# Patient Record
Sex: Male | Born: 1965 | Race: Black or African American | Hispanic: No | Marital: Married | Smoking: Never smoker
Health system: Southern US, Community
[De-identification: ages and names within clinical notes are randomized; demographics above are authoritative.]

## PROBLEM LIST (undated history)

## (undated) DIAGNOSIS — G4730 Sleep apnea, unspecified: Secondary | ICD-10-CM

## (undated) DIAGNOSIS — I10 Essential (primary) hypertension: Secondary | ICD-10-CM

## (undated) DIAGNOSIS — E119 Type 2 diabetes mellitus without complications: Secondary | ICD-10-CM

## (undated) DIAGNOSIS — T8859XA Other complications of anesthesia, initial encounter: Secondary | ICD-10-CM

## (undated) DIAGNOSIS — I639 Cerebral infarction, unspecified: Secondary | ICD-10-CM

## (undated) DIAGNOSIS — R0600 Dyspnea, unspecified: Secondary | ICD-10-CM

## (undated) DIAGNOSIS — N529 Male erectile dysfunction, unspecified: Secondary | ICD-10-CM

## (undated) DIAGNOSIS — E785 Hyperlipidemia, unspecified: Secondary | ICD-10-CM

## (undated) DIAGNOSIS — K219 Gastro-esophageal reflux disease without esophagitis: Secondary | ICD-10-CM

## (undated) DIAGNOSIS — R7989 Other specified abnormal findings of blood chemistry: Secondary | ICD-10-CM

## (undated) DIAGNOSIS — T4145XA Adverse effect of unspecified anesthetic, initial encounter: Secondary | ICD-10-CM

## (undated) DIAGNOSIS — M1990 Unspecified osteoarthritis, unspecified site: Secondary | ICD-10-CM

## (undated) DIAGNOSIS — Z8719 Personal history of other diseases of the digestive system: Secondary | ICD-10-CM

## (undated) HISTORY — DX: Essential (primary) hypertension: I10

## (undated) HISTORY — DX: Male erectile dysfunction, unspecified: N52.9

## (undated) HISTORY — DX: Other specified abnormal findings of blood chemistry: R79.89

## (undated) HISTORY — PX: OTHER SURGICAL HISTORY: SHX169

## (undated) HISTORY — DX: Hyperlipidemia, unspecified: E78.5

## (undated) HISTORY — PX: BACK SURGERY: SHX140

---

## 2003-11-04 ENCOUNTER — Ambulatory Visit (HOSPITAL_COMMUNITY): Admission: RE | Admit: 2003-11-04 | Discharge: 2003-11-04 | Payer: Self-pay | Admitting: Preventative Medicine

## 2012-12-01 ENCOUNTER — Ambulatory Visit (INDEPENDENT_AMBULATORY_CARE_PROVIDER_SITE_OTHER): Payer: Managed Care, Other (non HMO) | Admitting: Family Medicine

## 2012-12-01 ENCOUNTER — Encounter: Payer: Self-pay | Admitting: Family Medicine

## 2012-12-01 ENCOUNTER — Ambulatory Visit: Payer: Self-pay | Admitting: Family Medicine

## 2012-12-01 VITALS — BP 142/90 | Wt 251.0 lb

## 2012-12-01 DIAGNOSIS — M25569 Pain in unspecified knee: Secondary | ICD-10-CM

## 2012-12-01 DIAGNOSIS — I10 Essential (primary) hypertension: Secondary | ICD-10-CM

## 2012-12-01 DIAGNOSIS — Z0000 Encounter for general adult medical examination without abnormal findings: Secondary | ICD-10-CM

## 2012-12-01 DIAGNOSIS — E291 Testicular hypofunction: Secondary | ICD-10-CM

## 2012-12-01 MED ORDER — NAPROXEN 500 MG PO TABS: 500.0000 mg | ORAL_TABLET | Freq: Two times a day (BID) | ORAL | Status: DC

## 2012-12-01 NOTE — Progress Notes (Signed)
Arterial ultrasound at [facility] Wed May 14 @12pm .

## 2012-12-01 NOTE — Progress Notes (Signed)
  Subjective:    Patient ID: Craig Robertson, male    DOB: August 09, 1965, 47 y.o.   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 He also relates at times he's tried ibuprofen helped a little bit. He's had no known injury. The pain even bothers him at night. It does not bother him in the calves. He is worried about the possibility of circulation issues so therefore he is come in today to be checked he does notHave any history of claudication. He does smoke. Past medical history benign, HTN he is due for a wellness exam family history noncontributory Review of Systems    See above Objective:   Physical Exam  Lungs are clear, heart is regular, pulse normal. Extremities no edema skin warm dry dorsal pulse weak on both sides calf nontender no crepitus noted in knees.      Assessment & Plan:  More than likely arthralgias of the knees causing pain throughout that area certainly if progressive troubles or if worse may need further testing but for now try Naprosyn 500 mg twice a day, we will do ultrasound to rule out claudication/circulation issues and also lab work ordered he is to followup for a wellness exam within the next few weeks.

## 2012-12-01 NOTE — Patient Instructions (Addendum)
Arterial Ultrasound at [facility] Wed May 14 at 11:45am.

## 2012-12-02 ENCOUNTER — Ambulatory Visit: Payer: Self-pay | Admitting: Family Medicine

## 2012-12-03 ENCOUNTER — Ambulatory Visit (HOSPITAL_COMMUNITY)
Admission: RE | Admit: 2012-12-03 | Discharge: 2012-12-03 | Disposition: A | Discharge status: Home / Self Care | Payer: Managed Care, Other (non HMO) | Source: Ambulatory Visit | Attending: Family Medicine | Admitting: Family Medicine

## 2012-12-03 DIAGNOSIS — M25569 Pain in unspecified knee: Secondary | ICD-10-CM

## 2012-12-03 DIAGNOSIS — I739 Peripheral vascular disease, unspecified: Secondary | ICD-10-CM | POA: Insufficient documentation

## 2012-12-03 DIAGNOSIS — I10 Essential (primary) hypertension: Secondary | ICD-10-CM | POA: Insufficient documentation

## 2012-12-03 DIAGNOSIS — R29898 Other symptoms and signs involving the musculoskeletal system: Secondary | ICD-10-CM | POA: Insufficient documentation

## 2012-12-03 LAB — BASIC METABOLIC PANEL
CO2: 27 mEq/L (ref 19–32)
Calcium: 9.6 mg/dL (ref 8.4–10.5)
Chloride: 104 mEq/L (ref 96–112)
Glucose, Bld: 92 mg/dL (ref 70–99)
Potassium: 4.6 mEq/L (ref 3.5–5.3)
Sodium: 139 mEq/L (ref 135–145)

## 2012-12-03 LAB — CBC WITH DIFFERENTIAL/PLATELET
Basophils Relative: 1 % (ref 0–1)
Hemoglobin: 14.4 g/dL (ref 13.0–17.0)
MCHC: 34.3 g/dL (ref 30.0–36.0)
Monocytes Relative: 8 % (ref 3–12)
Neutro Abs: 2.2 10*3/uL (ref 1.7–7.7)
Neutrophils Relative %: 47 % (ref 43–77)
Platelets: 288 10*3/uL (ref 150–400)
RBC: 4.84 MIL/uL (ref 4.22–5.81)

## 2012-12-03 LAB — LIPID PANEL
HDL: 54 mg/dL (ref >39)
LDL Cholesterol: 120 mg/dL — ABNORMAL HIGH (ref 0–99)
Total CHOL/HDL Ratio: 3.5 Ratio
VLDL: 17 mg/dL (ref 0–40)

## 2012-12-03 LAB — HEPATIC FUNCTION PANEL
AST: 25 U/L (ref 0–37)
Bilirubin, Direct: 0.1 mg/dL (ref 0.0–0.3)
Total Bilirubin: 0.7 mg/dL (ref 0.3–1.2)

## 2012-12-03 LAB — TESTOSTERONE: Testosterone: 201 ng/dL — ABNORMAL LOW (ref 300–890)

## 2012-12-03 LAB — PSA: PSA: 1.6 ng/mL (ref <=4.00)

## 2012-12-08 ENCOUNTER — Other Ambulatory Visit: Payer: Self-pay | Admitting: Family Medicine

## 2012-12-09 ENCOUNTER — Telehealth: Payer: Self-pay | Admitting: Family Medicine

## 2012-12-09 ENCOUNTER — Other Ambulatory Visit: Payer: Self-pay | Admitting: *Deleted

## 2012-12-09 MED ORDER — FLUTICASONE PROPIONATE 50 MCG/ACT NA SUSP: 2.0000 | Freq: Every day | NASAL | Status: DC

## 2012-12-09 NOTE — Telephone Encounter (Signed)
Patient needs a refill of his flonase to [facility] [address]

## 2012-12-09 NOTE — Telephone Encounter (Signed)
Notified patient the cough could be related to her blood pressure medicine. If the cough persists recommend that she come in to be seen schedule a regular visit.in some cases we have to stop lisinopril and try a different blood pressure medicine. This is too complicated of an issue to salt over the phone if the cough persists an office visit would be mandatory. As for the numbness in the hand that could be related to carpal tunnel doctor believes a regular office visit with her provider somewhere within the next couple weeks is reasonable. She is to followup sooner problems. Medication was sent to pharmacy. Patient verbalized understanding.

## 2012-12-09 NOTE — Telephone Encounter (Signed)
Please disregard note below. Entered in error in wrong patient chart.

## 2013-01-13 ENCOUNTER — Telehealth: Payer: Self-pay | Admitting: Family Medicine

## 2013-01-13 ENCOUNTER — Other Ambulatory Visit: Payer: Self-pay | Admitting: *Deleted

## 2013-01-13 NOTE — Telephone Encounter (Signed)
Ok 6 ref 

## 2013-01-13 NOTE — Telephone Encounter (Signed)
Patient needs a refill of Androgel to [facility]

## 2013-01-13 NOTE — Telephone Encounter (Signed)
Pt aware of refill of androgel to pharmacy of choice

## 2013-01-14 ENCOUNTER — Encounter: Payer: Self-pay | Admitting: *Deleted

## 2013-01-21 ENCOUNTER — Other Ambulatory Visit: Payer: Self-pay | Admitting: Family Medicine

## 2013-01-21 ENCOUNTER — Telehealth: Payer: Self-pay | Admitting: Family Medicine

## 2013-01-21 MED ORDER — VERAPAMIL HCL ER 180 MG PO CP24: 180.0000 mg | ORAL_CAPSULE | Freq: Every day | ORAL | Status: DC

## 2013-01-21 NOTE — Telephone Encounter (Signed)
Rx sent electronically to [facility]. Patient notified.

## 2013-01-21 NOTE — Telephone Encounter (Signed)
Patient needs refill of verapamil. He is completely out.  [facility]

## 2013-02-18 ENCOUNTER — Other Ambulatory Visit: Payer: Self-pay | Admitting: Family Medicine

## 2013-03-09 ENCOUNTER — Other Ambulatory Visit: Payer: Self-pay | Admitting: Family Medicine

## 2013-03-17 ENCOUNTER — Other Ambulatory Visit: Payer: Self-pay | Admitting: Family Medicine

## 2013-04-08 ENCOUNTER — Other Ambulatory Visit: Payer: Self-pay | Admitting: *Deleted

## 2013-04-08 MED ORDER — ENALAPRIL MALEATE 10 MG PO TABS: ORAL_TABLET | ORAL | Status: DC

## 2013-04-08 MED ORDER — FLUTICASONE PROPIONATE 50 MCG/ACT NA SUSP: 2.0000 | Freq: Every day | NASAL | Status: DC

## 2013-04-08 MED ORDER — NAPROXEN 500 MG PO TABS: 500.0000 mg | ORAL_TABLET | Freq: Two times a day (BID) | ORAL | Status: DC

## 2013-04-17 ENCOUNTER — Telehealth: Payer: Self-pay | Admitting: Family Medicine

## 2013-04-17 MED ORDER — VERAPAMIL HCL ER 180 MG PO CP24: 180.0000 mg | ORAL_CAPSULE | Freq: Every day | ORAL | Status: DC

## 2013-04-17 NOTE — Telephone Encounter (Signed)
Patient needs Rx for verapamil.Marland KitchenPlease call when complete.   [facility]

## 2013-04-17 NOTE — Telephone Encounter (Signed)
Med sent to pharm. Pt notified.  

## 2013-05-31 ENCOUNTER — Other Ambulatory Visit: Payer: Self-pay | Admitting: Family Medicine

## 2013-06-20 ENCOUNTER — Other Ambulatory Visit: Payer: Self-pay | Admitting: Family Medicine

## 2013-06-23 ENCOUNTER — Encounter: Payer: Self-pay | Admitting: *Deleted

## 2013-06-23 ENCOUNTER — Other Ambulatory Visit: Payer: Self-pay | Admitting: *Deleted

## 2013-06-24 ENCOUNTER — Other Ambulatory Visit: Payer: Self-pay | Admitting: *Deleted

## 2013-06-24 MED ORDER — VERAPAMIL HCL ER 180 MG PO CP24: 180.0000 mg | ORAL_CAPSULE | Freq: Every day | ORAL | Status: DC

## 2013-07-23 DIAGNOSIS — I639 Cerebral infarction, unspecified: Secondary | ICD-10-CM

## 2013-07-23 HISTORY — DX: Cerebral infarction, unspecified: I63.9

## 2013-07-23 HISTORY — PX: LUMBAR DISC SURGERY: SHX700

## 2013-08-20 ENCOUNTER — Other Ambulatory Visit: Payer: Self-pay | Admitting: *Deleted

## 2013-08-20 MED ORDER — TESTOSTERONE 12.5 MG/ACT (1%) TD GEL: TRANSDERMAL | Status: DC

## 2013-08-27 ENCOUNTER — Ambulatory Visit (INDEPENDENT_AMBULATORY_CARE_PROVIDER_SITE_OTHER): Payer: Managed Care, Other (non HMO) | Admitting: Family Medicine

## 2013-08-27 ENCOUNTER — Encounter: Payer: Self-pay | Admitting: Family Medicine

## 2013-08-27 VITALS — BP 132/94 | Temp 99.2°F | Ht 71.5 in | Wt 252.0 lb

## 2013-08-27 DIAGNOSIS — J019 Acute sinusitis, unspecified: Secondary | ICD-10-CM

## 2013-08-27 MED ORDER — LEVOFLOXACIN 500 MG PO TABS: 500.0000 mg | ORAL_TABLET | Freq: Every day | ORAL | Status: AC

## 2013-08-27 NOTE — Progress Notes (Signed)
   Subjective:    Patient ID: Craig Robertson, male    DOB: 1966-01-19, 48 y.o.   MRN: [id number]  Cough This is a new problem. The current episode started more than 1 month ago. Associated symptoms include myalgias and nasal congestion. Associated symptoms comments: Nose bleeds. Treatments tried: flonase, mucinex, tylenol, dayquil. The treatment provided mild relief.   Patient blood pressure is up today but he admits to taking over-the-counter medications that could because go up he was encouraged once he feels well get away from those eat a heart healthy diet.   Review of Systems  Respiratory: Positive for cough.   Musculoskeletal: Positive for myalgias.       Objective:   Physical Exam Eardrums normal sinus mild tenderness throat is normal neck is supple lungs are clear no crackles heart is regular patient not toxic       Assessment & Plan:  Viral syndrome with secondary sinusitis antibiotics prescribed should gradually get better  Patient was encouraged to followup very latest by spring time to be seen for his chronic health issues

## 2013-08-27 NOTE — Patient Instructions (Signed)
Hypertension Hypertension is another name for high blood pressure. High blood pressure may mean that your heart needs to work harder to pump blood. Blood pressure consists of two numbers, which includes a higher number over a lower number (example: 110/72). HOME CARE   Make lifestyle changes as told by your doctor. This may include weight loss and exercise.  Take your blood pressure medicine every day.  Limit how much salt you use.  Stop smoking if you smoke.  Do not use drugs.  Talk to your doctor if you are using decongestants or birth control pills. These medicines might make blood pressure higher.  Females should not drink more than 1 alcoholic drink per day. Males should not drink more than 2 alcoholic drinks per day.  See your doctor as told. GET HELP RIGHT AWAY IF:   You have a blood pressure reading with a top number of 180 or higher.  You get a very bad headache.  You get blurred or changing vision.  You feel confused.  You feel weak, numb, or faint.  You get chest or belly (abdominal) pain.  You throw up (vomit).  You cannot breathe very well. MAKE SURE YOU:   Understand these instructions.  Will watch your condition.  Will get help right away if you are not doing well or get worse. Document Released: 12/26/2007 Document Revised: 10/01/2011 Document Reviewed: 12/26/2007 [facility] Patient Information 2014 [place], [place].    DASH Diet The DASH diet stands for "Dietary Approaches to Stop Hypertension." It is a healthy eating plan that has been shown to reduce high blood pressure (hypertension) in as little as 14 days, while also possibly providing other significant health benefits. These other health benefits include reducing the risk of breast cancer after menopause and reducing the risk of type 2 diabetes, heart disease, colon cancer, and stroke. Health benefits also include weight loss and slowing kidney failure in patients with chronic kidney disease.    DIET GUIDELINES  Limit salt (sodium). Your diet should contain less than 1500 mg of sodium daily.  Limit refined or processed carbohydrates. Your diet should include mostly whole grains. Desserts and added sugars should be used sparingly.  Include small amounts of heart-healthy fats. These types of fats include nuts, oils, and tub margarine. Limit saturated and trans fats. These fats have been shown to be harmful in the body. CHOOSING FOODS  The following food groups are based on a 2000 calorie diet. See your Registered Dietitian for individual calorie needs. Grains and Grain Products (6 to 8 servings daily)  Eat More Often: Whole-wheat bread, brown rice, whole-grain or wheat pasta, quinoa, popcorn without added fat or salt (air popped).  Eat Less Often: White bread, white pasta, white rice, cornbread. Vegetables (4 to 5 servings daily)  Eat More Often: Fresh, frozen, and canned vegetables. Vegetables may be raw, steamed, roasted, or grilled with a minimal amount of fat.  Eat Less Often/Avoid: Creamed or fried vegetables. Vegetables in a cheese sauce. Fruit (4 to 5 servings daily)  Eat More Often: All fresh, canned (in natural juice), or frozen fruits. Dried fruits without added sugar. One hundred percent fruit juice ( cup [237 mL] daily).  Eat Less Often: Dried fruits with added sugar. Canned fruit in light or heavy syrup. [employer], Fish, and Poultry (2 servings or less daily. One serving is 3 to 4 oz [85-114 g]).  Eat More Often: Ninety percent or leaner ground beef, tenderloin, sirloin. Round cuts of beef, chicken breast, [place] breast.  All fish. Grill, bake, or broil your meat. Nothing should be fried.  Eat Less Often/Avoid: Fatty cuts of meat, [place]urkey, or chicken leg, thigh, or wing. Fried cuts of meat or fish. Dairy (2 to 3 servings)  Eat More Often: Low-fat or fat-free milk, low-fat plain or light yogurt, reduced-fat or part-skim cheese.  Eat Less Often/Avoid: Milk (whole,  2%).Whole milk yogurt. Full-fat cheeses. Nuts, Seeds, and Legumes (4 to 5 servings per week)  Eat More Often: All without added salt.  Eat Less Often/Avoid: Salted nuts and seeds, canned beans with added salt. Fats and Sweets (limited)  Eat More Often: Vegetable oils, tub margarines without trans fats, sugar-free gelatin. Mayonnaise and salad dressings.  Eat Less Often/Avoid: Coconut oils, palm oils, butter, stick margarine, cream, half and half, cookies, candy, pie. FOR MORE INFORMATION The Dash Diet Eating Plan: www.dashdiet.org Document Released: 06/28/2011 Document Revised: 10/01/2011 Document Reviewed: 06/28/2011 [facility] Patient Information 2014 [place].

## 2013-09-06 ENCOUNTER — Other Ambulatory Visit: Payer: Self-pay | Admitting: Family Medicine

## 2013-11-13 ENCOUNTER — Telehealth: Payer: Self-pay | Admitting: Family Medicine

## 2013-11-13 ENCOUNTER — Other Ambulatory Visit: Payer: Self-pay | Admitting: *Deleted

## 2013-11-13 MED ORDER — VERAPAMIL HCL ER 180 MG PO CP24: 180.0000 mg | ORAL_CAPSULE | Freq: Every day | ORAL | Status: DC

## 2013-11-13 NOTE — Telephone Encounter (Signed)
Med sent to pharm. Pt notified.  

## 2013-11-13 NOTE — Telephone Encounter (Signed)
Patient needs Rx for verapamil.   [facility]

## 2013-11-22 DIAGNOSIS — M5412 Radiculopathy, cervical region: Secondary | ICD-10-CM | POA: Insufficient documentation

## 2013-11-22 DIAGNOSIS — M5416 Radiculopathy, lumbar region: Secondary | ICD-10-CM | POA: Insufficient documentation

## 2013-11-23 DIAGNOSIS — G8191 Hemiplegia, unspecified affecting right dominant side: Secondary | ICD-10-CM | POA: Insufficient documentation

## 2013-12-08 ENCOUNTER — Ambulatory Visit (HOSPITAL_COMMUNITY)
Admission: RE | Admit: 2013-12-08 | Discharge: 2013-12-08 | Disposition: A | Discharge status: Home / Self Care | Payer: Managed Care, Other (non HMO) | Source: Ambulatory Visit | Attending: Family Medicine | Admitting: Family Medicine

## 2013-12-08 DIAGNOSIS — IMO0002 Reserved for concepts with insufficient information to code with codable children: Secondary | ICD-10-CM

## 2013-12-08 DIAGNOSIS — M2560 Stiffness of unspecified joint, not elsewhere classified: Secondary | ICD-10-CM | POA: Insufficient documentation

## 2013-12-08 DIAGNOSIS — M25569 Pain in unspecified knee: Secondary | ICD-10-CM | POA: Insufficient documentation

## 2013-12-08 DIAGNOSIS — Z5189 Encounter for other specified aftercare: Secondary | ICD-10-CM | POA: Insufficient documentation

## 2013-12-08 DIAGNOSIS — M6281 Muscle weakness (generalized): Secondary | ICD-10-CM | POA: Insufficient documentation

## 2013-12-08 DIAGNOSIS — I69993 Ataxia following unspecified cerebrovascular disease: Secondary | ICD-10-CM | POA: Insufficient documentation

## 2013-12-08 DIAGNOSIS — I10 Essential (primary) hypertension: Secondary | ICD-10-CM | POA: Insufficient documentation

## 2013-12-08 DIAGNOSIS — R52 Pain, unspecified: Secondary | ICD-10-CM | POA: Insufficient documentation

## 2013-12-08 HISTORY — DX: Reserved for concepts with insufficient information to code with codable children: IMO0002

## 2013-12-08 NOTE — Evaluation (Signed)
Physical Therapy Evaluation  Patient Details  Name: Craig Robertson MRN: [id number] Date of Birth: [birth year]  Today's Date: 12/08/2013 Time: [id number] PT Time Calculation (min): 45 min    Charges 1 Eval, [id number] therEx          Visit#: 1 of 24  Re-eval: 01/07/14 Assessment Diagnosis: Rt sided LE weakness SP CVA.  Next MD Visit: Cheryle HorsfallLukiin, June 3  Authorization: [employer]    Authorization Time Period:    Authorization Visit#: 1 of 24   Past Medical History:  Past Medical History  Diagnosis Date  . Hypertension   . ED (erectile dysfunction)   . Hyperlipidemia   . Low testosterone    Past Surgical History: No past surgical history on file.  Subjective Symptoms/Limitations Symptoms: Patient has Rt knee pain. TTP. 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  Special Tests: Functional Assessment Questionnaire (FAQ) 36/68 (53% independent, 475 impaired) Patient Stated Goals: Patient was to be able to return to fishing, motor cycling,  Pain Assessment Currently in Pain?: Yes Pain Score: 6  Pain Location: Knee Pain Orientation: Right Pain Onset: 1 to 4 weeks ago Pain Frequency: Constant Pain Relieving Factors: Ice, pain medication only dulls it Effect of Pain on Daily Activities: bendig the knee, kneeling.   Precautions/Restrictions  Precautions Precautions: None Restrictions Weight Bearing Restrictions: No  Balance Screening Balance Screen Has the patient fallen in the past 6 months: No  Prior Functioning  Home Living Family/patient expects to be discharged to:: Private residence Living Arrangements: Spouse/significant other;Children Available Help at Discharge: Family (wife, 46 yo son) Prior Function Level of Independence: Independent with basic ADLs;Independent with homemaking with ambulation Driving: Yes (no currently) Vocation: Full time  employment Vocation Requirements: Programme researcher, broadcasting/film/videomachanic for [employer] (Pt wants to return to work) Leisure: Hobbies-yes (Comment) Comments: Fishing, riding motorcycles  Cognition/Observation Cognition Overall Cognitive Status: Within Functional Limits for tasks assessed Arousal/Alertness: Awake/alert Orientation Level: Oriented X4 Observation/Other Assessments Observations: 3D hip Excursions: limited R Frontal plane Excursion.  Other Assessments: FOTO 48% limited  Sensation/Coordination/Flexibility/Functional Tests Sensation Light Touch: Appears Intact Proprioception: Appears Intact Additional Comments: pt previously had tingling in the 5 tips of his fingers, but has subsided.  Pt has significntly decreased sharp/dull and deep presure sensation in right 3rd digit, specifically the plamar surface of the 3rd digit and dorsally to the PIP of the 3rd. Coordination Gross Motor Movements are Fluid and Coordinated: Yes Fine Motor Movements are Fluid and Coordinated: Yes 9 Hole Peg Test: Right 22.56 seconds, Left 19.83 seconds Functional Tests Functional Tests: Gait: Patient ambulates with limited weight shift to Rt Functional Tests: Stairs: requires 1 HHA Functional Tests: Positive Ely test,   Assessment RUE AROM (degrees) RUE Overall AROM Comments: Assess in sitting Right Shoulder Flexion: 123 Degrees Right Shoulder ABduction: 91 Degrees Right Elbow Flexion: 135 Right Elbow Extension:  (WFL) Right Forearm Pronation:  (WFL) Right Forearm Supination:  (WFL) Right Wrist Extension: 36 Degrees Right Wrist Flexion:  (WFL) RUE Strength RUE Overall Strength Comments: Assess in sitting Right Elbow Flexion:  (4-/5) Right Elbow Extension: 4/5 Grip (lbs): 89 Lateral Pinch: 15 lbs 3 Point Pinch: 12 lbs LUE Strength Grip (lbs): 161 Lateral Pinch: 30 lbs 3 Point Pinch: 27 lbs RLE Strength Right Hip Flexion: 2+/5 Right Hip Extension: 2+/5 Right Hip ABduction: 2+/5 Right Hip ADduction:  2+/5 Right Knee Flexion: 2+/5 Right Knee Extension: 2+/5 Right Ankle Dorsiflexion: 4/5  Exercise/Treatments Standing Other Standing Knee Exercises: 3D hip excursion 10x each Other Standing Knee Exercises: 10x squat with UE support    Physical Therapy Assessment and Plan PT Assessment and Plan Clinical Impression Statement: Patient displays Rt sided LE weakness s/p CVA with right knee pain secondary to limited AROM mobility due to weakness in all Rt LE muscles. Patient demonstrates good progress from initial incident following home health and inpatient Rehab.  Pt will benefit from skilled therapeutic intervention in order to improve on the following deficits: Impaired UE functional use;Decreased range of motion;Decreased safety awareness;Impaired sensation;Decreased strength;Decreased coordination Rehab Potential: Good Clinical Impairments Affecting Rehab Potential: Patient has yet to show any signs of plateua  PT Frequency: Min 2X/week PT Duration: 8 weeks PT Treatment/Interventions: Gait training;Stair training;Functional mobility training;Therapeutic activities;Therapeutic exercise;Balance training;Modalities;Manual techniques;Patient/family education PT Plan: Initial focus to be on ediucation and increaseing LE. Next session introlduce stanidng LE stretches for HEP and initialize LE strneghtneing    Goals PT Short Term Goals Time to Complete Short Term Goals: 4 weeks PT Short Term Goal 1: Patient Will demsontrate improved Rt knee extension and  hip extenisno strength to 4+/5 so patient can  perform sit to stand from chair withotu UE support PT Short Term Goal 2: Patient will demsontrate improved Rt hamstring strength to 4/5 so patient can control descent when walkign down a hill PT Short Term Goal 3: Patietn will demsontrated improved hip abduction strength to 4/5 so patient can evenely weight shift during gait PT Long Term Goals Time to Complete Long Term Goals: 8 weeks PT Long Term  Goal 1: Patient Will demsontrate improved Rt knee extension and  hip extenisno strength to 5/5 so patient can ambulate up a flight of stairs withotu UE support PT Long Term Goal 2: Patient will demsontrate improved Rt hamstring strength to 5/5 so patient can control descent when walkign down stairs Long Term Goal 3: Patietn will demsontrated improved hip abduction strength to 5/5 so patient can perform single leg stand bilaterally for greater than 10 seconds  Problem List Patient Active Problem List   Diagnosis Date Noted  . Muscle weakness (generalized) 12/08/2013  . Decreased range of motion of upper extremity 12/08/2013  . 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 Essential hypertension, benign 12/01/2012  . Hypogonadism male 12/01/2012    PT - End of Session Activity Tolerance: Patient tolerated treatment well General Behavior During Therapy: [facility] for tasks assessed/performed PT Plan of Care PT Home Exercise Plan: 3DE hip Excursions, and Squats with UE support Consulted and Agree with Plan of Care: Patient;Family member/caregiver Family Member Consulted: wife  GP    Doyne KeelCash R Keshon Markovitz 12/08/2013, 7:12 PM  Physician Documentation Your signature is required to indicate approval of the treatment plan as stated above.  Please sign and either send electronically or make a copy of this report for your files and return this physician signed original.   Please mark one 1.__approve of plan  2. ___approve of plan with the following conditions.   ______________________________                                                          _____________________ Physician Signature  Date  

## 2013-12-08 NOTE — Evaluation (Signed)
Occupational Therapy Evaluation  Patient Details  Name: Craig Robertson MRN: [id number] Date of Birth: August 26, 1965  Today's Date: 12/08/2013 Time: 1400-1430 OT Time Calculation (min): 30 min Eval 30'  Visit#: 1 of 16  Re-eval: 01/05/14  Assessment Diagnosis: CVA Surgical Date:  (n/a) Next MD Visit: June 3rd Prior Therapy: [facility]patient [facility]  Authorization: Rosann Auerbachigna Managed  Authorization Time Period:    Authorization Visit#: 1 of     Past Medical History:  Past Medical History  Diagnosis Date  . Hypertension   . ED (erectile dysfunction)   . Hyperlipidemia   . Low testosterone    Past Surgical History: No past surgical history on file.  Subjective Symptoms/Limitations Symptoms: S: "So, so.  I have muscle spasms and headaches everyday. I 'm just weak." Pertinent History: Pt is 48 yo male presenting s/p CVA earlier this month.  the night of May 1st and 2nd pt had CVA affecting right side - pt woke up on the 2nd and nearly fell when getting out of bed and noted he was dragging his right side.  Pt was in [place] at the time, but after retuning home to [place] presented to the ER.  Pt was hospitilized for 1 week, and then [facility] [place] for approximatley 1 week.  He returned home last Wednesday and is now presenting to outpatient therapies. Special Tests: Functional Assessment Questionnaire (FAQ) 36/68 (53% independent, 475 impaired) Pain Assessment Currently in Pain?: Yes Pain Score: 6  Pain Location: Knee Pain Orientation: Right Pain Onset: 1 to 4 weeks ago Pain Frequency: Constant Pain Relieving Factors: Ice, pain medication only dulls it Effect of Pain on Daily Activities: bendig the knee, kneeling.   Precautions/Restrictions  Precautions Precautions: None Restrictions Weight Bearing Restrictions: No  Balance Screening Balance Screen Has the patient fallen in the past 6 months: No  Prior Functioning  Home  Living Family/patient expects to be discharged to:: Private residence Living Arrangements: Spouse/significant other;Children Available Help at Discharge: Family (wife, 46 yo son) Prior Function Level of Independence: Independent with basic ADLs;Independent with homemaking with ambulation Driving: Yes (no currently) Vocation: Full time employment Vocation Requirements: Programme researcher, broadcasting/film/videomachanic for [employer] (Pt wants to return to work) Leisure: Hobbies-yes (Comment) Comments: Fishing, riding motorcycles  Assessment ADL/Vision/Perception ADL ADL Comments: Pt is currenlty supervision level with TTB transfers and independent with bathing.  he is at a modificed independent level with dressing. Dominant Hand: Left Vision - History Baseline Vision: Wears glasses all the time (Dizziness only with headaches)  Cognition/Observation Cognition Overall Cognitive Status: Within Functional Limits for tasks assessed Arousal/Alertness: Awake/alert Orientation Level: Oriented X4 Observation/Other Assessments Observations: 3D hip Excursions: limited R Frontal plane Excursion.   Sensation/Coordination/Edema Sensation Light Touch: Appears Intact Proprioception: Appears Intact Additional Comments: pt previously had tingling in the 5 tips of his fingers, but has subsided.  Pt has significntly decreased sharp/dull and deep presure sensation in right 3rd digit, specifically the plamar surface of the 3rd digit and dorsally to the PIP of the 3rd. Coordination Gross Motor Movements are Fluid and Coordinated: Yes Fine Motor Movements are Fluid and Coordinated: Yes 9 Hole Peg Test: Right 22.56 seconds, Left 19.83 seconds  Additional Assessments RUE AROM (degrees) RUE Overall AROM Comments: Assess in sitting Right Shoulder Flexion: 123 Degrees Right Shoulder ABduction: 91 Degrees Right Elbow Flexion: 135 Right Elbow Extension:  (WFL) Right Forearm Pronation:  (WFL) Right Forearm Supination:  (WFL) Right  Wrist Extension: 36 Degrees Right Wrist Flexion:  (  WFL) RUE Strength RUE Overall Strength Comments: Assess in sitting Right Elbow Flexion:  (4-/5) Right Elbow Extension: 4/5 Grip (lbs): 89 Lateral Pinch: 15 lbs 3 Point Pinch: 12 lbs LUE Strength Grip (lbs): 161 Lateral Pinch: 30 lbs 3 Point Pinch: 27 lbs Right Hand Strength - Pinch (lbs) Lateral Pinch: 15 lbs 3 Point Pinch: 12 lbs Left Hand Strength - Pinch (lbs) Lateral Pinch: 30 lbs 3 Point Pinch: 27 lbs      Activities of Daily Living Activities of Daily Living: Pt completed clock drawing test.  Initially forgot to include number 12, and only corrected with asking 'are all the numbers there?'  When asked to sraw clock to 2:30, pt intially drew hand straight upwards (to the 1 on his clockface), but then self corrected.  Occupational Therapy Assessment and Plan OT Assessment and Plan Clinical Impression Statement: Pt is presenting to outpatient OT with recent CVA and stays at inpatient rehab.  Pt verbalizes significant improvement from initial CVA deficits, but is still presenting with decreased AROM, decreased strength, and decreased coordination as a result.  he also has decreased sensation in his right 3rd digit, leading to the possibiliy of safety concerns. Pt will benefit from skilled therapeutic intervention in order to improve on the following deficits: Impaired UE functional use;Decreased range of motion;Decreased safety awareness;Impaired sensation;Decreased strength;Decreased coordination Rehab Potential: Good OT Frequency: Min 2X/week OT Duration: 8 weeks OT Treatment/Interventions: Self-care/ADL training;Therapeutic activities;Therapeutic exercise;Neuromuscular education;Patient/family education;Manual therapy;Modalities OT Plan: Pt will benefit from skilled OT services to increase strenght, improve coordination, and increase AROM in RUE.  Pt will also benefit from safety education concerning decreased sensation in R  hand.      Treatment Plan:  RUE strengthening, AROM, fine motor coordination, IADL training, work-related Equities trader) re-training   Goals Short Term Goals Time to Complete Short Term Goals: 4 weeks Short Term Goal 1: Pt will be educated on HEP Short Term Goal 2: Pt will improved RUE AROM to [facility] for increased ability to work towards casting a fishing line Short Term Goal 3: Pt will improve right grip strength by atleast 10# for improved ability to go back to work. Short Term Goal 4: Pt will engage in meal prep activity with less than 2 cues for safety. Short Term Goal 5: Pt will improve right pinch strength by atleast 3# for improved ability to go back to work. Long Term Goals Time to Complete Long Term Goals: 8 weeks Long Term Goal 1: Pt will achieve highest level of ADL, IADL, work, and leisure functioning using his RUE. Long Term Goal 2: Pt will improve RUE AROM to WNL for improved ability to cast a fishing line. Long Term Goal 3: pt will improve right grip strenght be atleast 20# for improved ability to go back to work. Long Term Goal 4: Pt will improve right pinch strength by atleast 6# for improved ability to engage in work related tasks.  Problem List Patient Active Problem List   Diagnosis Date Noted  . Muscle weakness (generalized) 12/08/2013  . Decreased range of motion of upper extremity 12/08/2013  . 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 Essential hypertension, benign 12/01/2012  . Hypogonadism male 12/01/2012    End of Session Activity Tolerance: Patient tolerated treatment well General Behavior During Therapy: [facility] for tasks assessed/performed OT Plan of Care OT Home Exercise Plan: Provide theraputty for hand strengthening  GO    Marry Guan, MS, OTR/L [phone]  12/08/2013, 5:29 PM  Physician Documentation Your  signature is required to indicate approval of the treatment plan as stated above.  Please sign and either send electronically or make a  copy of this report for your files and return this physician signed original.  Please mark one 1.__approve of plan  2. ___approve of plan with the following conditions.   ______________________________                                                          _____________________ Physician Signature                                                                                                             Date

## 2013-12-09 ENCOUNTER — Ambulatory Visit (INDEPENDENT_AMBULATORY_CARE_PROVIDER_SITE_OTHER): Payer: Managed Care, Other (non HMO) | Admitting: Family Medicine

## 2013-12-09 ENCOUNTER — Telehealth: Payer: Self-pay | Admitting: *Deleted

## 2013-12-09 ENCOUNTER — Encounter: Payer: Self-pay | Admitting: Family Medicine

## 2013-12-09 ENCOUNTER — Ambulatory Visit (HOSPITAL_COMMUNITY)
Admission: RE | Admit: 2013-12-09 | Discharge: 2013-12-09 | Disposition: A | Discharge status: Home / Self Care | Payer: Managed Care, Other (non HMO) | Source: Ambulatory Visit | Attending: Family Medicine | Admitting: Family Medicine

## 2013-12-09 ENCOUNTER — Ambulatory Visit (HOSPITAL_COMMUNITY)
Admission: RE | Admit: 2013-12-09 | Discharge: 2013-12-09 | Disposition: A | Discharge status: Home / Self Care | Payer: Managed Care, Other (non HMO) | Source: Ambulatory Visit | Attending: *Deleted | Admitting: *Deleted

## 2013-12-09 VITALS — BP 148/98 | Ht 71.5 in | Wt 246.0 lb

## 2013-12-09 DIAGNOSIS — M25569 Pain in unspecified knee: Secondary | ICD-10-CM | POA: Insufficient documentation

## 2013-12-09 DIAGNOSIS — Z8673 Personal history of transient ischemic attack (TIA), and cerebral infarction without residual deficits: Secondary | ICD-10-CM

## 2013-12-09 DIAGNOSIS — E785 Hyperlipidemia, unspecified: Secondary | ICD-10-CM

## 2013-12-09 DIAGNOSIS — I6350 Cerebral infarction due to unspecified occlusion or stenosis of unspecified cerebral artery: Secondary | ICD-10-CM

## 2013-12-09 DIAGNOSIS — Z79899 Other long term (current) drug therapy: Secondary | ICD-10-CM

## 2013-12-09 DIAGNOSIS — M25561 Pain in right knee: Secondary | ICD-10-CM

## 2013-12-09 DIAGNOSIS — I639 Cerebral infarction, unspecified: Secondary | ICD-10-CM

## 2013-12-09 LAB — LIPID PANEL
CHOL/HDL RATIO: 2.9 ratio
Cholesterol: 134 mg/dL (ref 0–200)
HDL: 46 mg/dL (ref >39)
LDL CALC: 73 mg/dL (ref 0–99)
TRIGLYCERIDES: 73 mg/dL (ref <150)
VLDL: 15 mg/dL (ref 0–40)

## 2013-12-09 LAB — HEPATIC FUNCTION PANEL
ALBUMIN: 3.8 g/dL (ref 3.5–5.2)
ALT: 68 U/L — ABNORMAL HIGH (ref 0–53)
AST: 38 U/L — ABNORMAL HIGH (ref 0–37)
Alkaline Phosphatase: 70 U/L (ref 39–117)
Bilirubin, Direct: <0.1 mg/dL (ref 0.0–0.3)
Total Bilirubin: 0.3 mg/dL (ref 0.3–1.2)
Total Protein: 7.7 g/dL (ref 6.0–8.3)

## 2013-12-09 MED ORDER — HYDROCODONE-ACETAMINOPHEN 5-325 MG PO TABS: 1.0000 | ORAL_TABLET | Freq: Four times a day (QID) | ORAL | Status: DC | PRN

## 2013-12-09 NOTE — Telephone Encounter (Signed)
Pt notified of appt 

## 2013-12-09 NOTE — Progress Notes (Signed)
   Subjective:    Patient ID: Craig GoreSteven D Brindle, male    DOB: July 13, 1966, 48 y.o.   MRN: [id number]  HPI Patient is here today d/t knee pain.hist of remote pain. Much worse since the stroke  He had a stroke on May 2nd.    Ever since his stroke, his right knee is causing him severe pain. It wakes him up at night. Currently involved in physical therapy. He feels this may have flared up his knee. Pain severe. Unable to take anti-inflammatories due to recent stroke.  He has been going to PT.  Pain radiates to his right hip, into his back.   neurologist due to followup soon.  Claims compliance with blood pressure medication.  Also claims compliance with her lipid medication. Is now on a higher dose. Status uncertain as far as blood work. Advised he needed some soon by a specialist.  Also patient was on testosterone supplement. The neurologist at the stroke center wonder whether this may have contributed to the patient's stroke. They told him they thought it did. No family history of stroke. Patient is a nonsmoker.     Review of Systems No chest pain no back pain no abdominal pain no change in bowel habits no rash no blood in stool ROS otherwise negative    Objective:   Physical Exam  Alert no apparent distress. Right-sided paresis persists. Lungs clear heart regular in rhythm. Blood pressure good on repeat knee pain for the extension mild crepitations no effusion ankles without edema      Assessment & Plan:  #1 hypertension good control. #2 status post stroke progressing with physical therapy. #3 flare of knee pain etiology unclear and severe #4 hyperlipidemia status uncertain plan diet exercise discussed. Maintain therapy. Followup with specialist. Orthopedic referral. Hydrocodone. For pain. Followup ears scheduled. Further recommendations based on blood work. WSL

## 2013-12-09 NOTE — Progress Notes (Signed)
Physical Therapy Treatment Patient Details  Name: Craig GoreSteven D Robertson MRN: [id number] Date of Birth: [birth year]  Today's Date: 12/09/2013 Time: 7829-56211146-1230 PT Time Calculation (min): 44 min  Visit#: 2 of 24  Re-eval: 01/07/14 Authorization: Rosann Auerbachigna  Authorization Visit#: 2 of 24  Charges:  therex 40'   Subjective: Symptoms/Limitations Symptoms: Pt states he went over to get a Rt knee xray prior to today's appointment.  States he's having 6/10 Rt knee pain today. Pain Assessment Currently in Pain?: Yes Pain Score: 6  Pain Location: Knee Pain Orientation: Right   Exercise/Treatments Stretches Active Hamstring Stretch: Limitations Active Hamstring Stretch Limitations: standing with 14" step 10X3" Gastroc Stretch: 3 reps;30 seconds;Limitations Gastroc Stretch Limitations: slant board Aerobic Stationary Bike: nustep 8 minutes LE only Standing Heel Raises: 10 reps;Limitations Heel Raises Limitations: toeraises 10 reps Gait Training: retro, side stepping 1RT each Other Standing Knee Exercises: 3D hip excursion 10x each Other Standing Knee Exercises: 10x squat with UE support      Physical Therapy Assessment and Plan PT Assessment:  Initiated new activities to challenge balance, increase LE strength and ROM.  Pt able to complete without c/o pain today.  Pt required 2 seated rest breaks today during session.  Most difficulty with side stepping with increasing difficulty advancing Rt LE at end of 30 feet distance.  Pt required therapist facilitation to increase stride with walking activities. PT Plan: Continue to progress strength, ROM and balance.  Next session progress with lunges and step ups.  Continue to increase dynamic balance and gait quality.    Problem List Patient Active Problem List   Diagnosis Date Noted  . Muscle weakness (generalized) 12/08/2013  . Decreased range of motion of upper extremity 12/08/2013  . 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 Essential  hypertension, benign 12/01/2012  . Hypogonadism male 12/01/2012    PT - End of Session Activity Tolerance: Patient tolerated treatment well General Behavior During Therapy: [facility] for tasks assessed/performed   Lurena NidaAmy B Frazier, PTA/CLT 12/09/2013, 12:30 PM

## 2013-12-09 NOTE — Telephone Encounter (Signed)
Left message to return call to give pt appt info. appt tomorrow at [facility] specialist in [place]. Arrive at 10:15.

## 2013-12-11 ENCOUNTER — Other Ambulatory Visit: Payer: Self-pay | Admitting: *Deleted

## 2013-12-11 ENCOUNTER — Ambulatory Visit (HOSPITAL_COMMUNITY): Payer: Managed Care, Other (non HMO)

## 2013-12-11 ENCOUNTER — Ambulatory Visit (HOSPITAL_COMMUNITY)
Admission: RE | Admit: 2013-12-11 | Discharge: 2013-12-11 | Disposition: A | Discharge status: Home / Self Care | Payer: Managed Care, Other (non HMO) | Source: Ambulatory Visit | Attending: *Deleted | Admitting: *Deleted

## 2013-12-11 ENCOUNTER — Ambulatory Visit (HOSPITAL_COMMUNITY)
Admission: RE | Admit: 2013-12-11 | Discharge: 2013-12-11 | Disposition: A | Discharge status: Home / Self Care | Payer: Managed Care, Other (non HMO) | Source: Ambulatory Visit | Attending: Family Medicine | Admitting: Family Medicine

## 2013-12-11 DIAGNOSIS — Z79899 Other long term (current) drug therapy: Secondary | ICD-10-CM

## 2013-12-11 DIAGNOSIS — E785 Hyperlipidemia, unspecified: Secondary | ICD-10-CM

## 2013-12-11 MED ORDER — ATORVASTATIN CALCIUM 40 MG PO TABS: 40.0000 mg | ORAL_TABLET | Freq: Every day | ORAL | Status: DC

## 2013-12-11 NOTE — Progress Notes (Signed)
Physical Therapy Treatment Patient Details  Name: Craig GoreSteven D Willis MRN: [id number] Date of Birth: [birth year]  Today's Date: 12/11/2013 Time: 1105-1150 PT Time Calculation (min): 45 min    Charges: [phone], TherAct [id number] Visit#: 2 of 24  Re-eval: 01/07/14 Assessment Diagnosis: Rt sided LE weakness SP CVA.  Next MD Visit: Cheryle HorsfallLukiin, June 3  Authorization: [employer]  Authorization Visit#: 2 of 24   Subjective: Symptoms/Limitations Symptoms: Patient recently saw doctor who prescribed pain madication that is decreasing the knee pain.   Exercise/Treatments Stretches Active Hamstring Stretch Limitations: standing with 14" step 10X3" Piriformis Stretch: 20 seconds;3 reps Gastroc Stretch: 3 reps;30 seconds;Limitations Gastroc Stretch Limitations: slant board Aerobic Stationary Bike: nustep 7 minutes LE and UEintervals lvl3 Standing Heel Raises: 10 reps;Limitations Heel Raises Limitations: toeraises 10 reps Forward Lunges: Limitations Forward Lunges Limitations: 8" box static, 10x each Other Standing Knee Exercises: 3D hip excursion 10x each      Splitstance frontal plane hip excursion 10x       10x squat with UE support       Sit to stand from decreasing table height down to 18" 3 sets 5 repetitions  Supine Bridges: Strengthening;Both;3 sets;10 reps (Sagital Transverse plane bridges) Other Supine Knee Exercises: hooklaying hip rotation 10x   Physical Therapy Assessment and Plan PT Assessment and Plan Clinical Impression Statement: patient displays improved Rt knee pain this session following introduction of pain medication by MD. Patient's gait and strength have since improved allowing patient to perform increased standing activities. Patiient continues to demonstrate limited activity tolerance  requiring multiple restbreaks throughout session. Patient demsontrated improved lateral weight shiftign today indicatign improving functional strength.  Patient demonstrated good  tolerance and performance of LE stretches though his piriformis flexibility was particularly limited.  PT Plan: Initial focus to be on ediucation and increaseing LEmobility. Next session introlduce stanidng LE stretches for HEP and initialize LE strengthneing    Goals PT Short Term Goals PT Short Term Goal 1: Patient Will demsontrate improved Rt knee extension and  hip extenisno strength to 4+/5 so patient can  perform sit to stand from chair withotu UE support PT Short Term Goal 1 - Progress: Progressing toward goal PT Short Term Goal 2: Patient will demsontrate improved Rt hamstring strength to 4/5 so patient can control descent when walkign down a hill PT Short Term Goal 2 - Progress: Progressing toward goal PT Short Term Goal 3: Patietn will demsontrated improved hip abduction strength to 4/5 so patient can evenely weight shift during gait PT Short Term Goal 3 - Progress: Progressing toward goal PT Long Term Goals PT Long Term Goal 1: Patient Will demsontrate improved Rt knee extension and  hip extenisno strength to 5/5 so patient can ambulate up a flight of stairs withotu UE support PT Long Term Goal 1 - Progress: Progressing toward goal PT Long Term Goal 2: Patient will demsontrate improved Rt hamstring strength to 5/5 so patient can control descent when walkign down stairs PT Long Term Goal 2 - Progress: Progressing toward goal Long Term Goal 3: Patient will demsontrate improved hip abduction strength to 5/5 so patient can perform single leg stand bilaterally for greater than 10 seconds Long Term Goal 3 Progress: Progressing toward goal  Problem List Patient Active Problem List   Diagnosis Date Noted  . Muscle weakness (generalized) 12/08/2013  . Decreased range of motion of upper extremity 12/08/2013  . 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 Essential hypertension, benign 12/01/2012  . Hypogonadism male 12/01/2012  GP   Hardie Pulley Kalvyn Desa PT DPT 12/11/2013, 11:58 AM

## 2013-12-11 NOTE — Progress Notes (Signed)
Occupational Therapy Treatment Patient Details  Name: Craig Robertson MRN: [id number] Date of Birth: 10/29/1965  Today's Date: 12/11/2013 Time: 6834-1962 OT Time Calculation (min): 45 min Neuro Re-ed 21'  Visit#: 2 of 16  Re-eval: 01/05/14    Authorization: Christella Scheuermann Managed  Authorization Time Period:    Authorization Visit#: 2 of    Subjective Symptoms/Limitations Symptoms: "I went to the orthopedic yesterday, and he said nothings broken." Pain Assessment Currently in Pain?: Yes Pain Score: 5  Pain Location: Foot Pain Orientation: Right;Left Pain Type:  (Tingling, like a toothache)  Precautions/Restrictions     Exercise/Treatments Seated Elevation: AROM;10 reps Row: AROM;10 reps Protraction: AROM;10 reps Flexion: AAROM;10 reps (1 rest break) Abduction: AAROM;10 reps (multiple rest breaks)  Neurological Re-education Exercises Elbow Flexion: AROM;10 reps Elbow Extension: AROM;10 reps Forearm Supination: AROM;10 reps Forearm Pronation: AROM;10 reps Sponges: 33, 33 Hand Gripper with Large Beads: hand gripper at 50# (gold in 5tihhole) to place colored pegs into pegboard.  reaching into flexion and protraction to grasp pegs out of box, then graping peg with gripper to place into peg board.  21 pegs. Removed pegs with gripper from pegboard, and maintaining grasp reached forwared to place pegs back into box.     Occupational Therapy Assessment and Plan OT Assessment and Plan Clinical Impression Statement: Initiated RUE strengtheing this session.  pt quickly fatigued with shoulder AAROM and pinch strengthening exericses.  In pegboard activity, pt had incrased fatigued with reaching forward to grasp pegs than the 50# gripping with gripper.  Added red theraputty to HEP. OT Plan: Focus session on R shoulder strengthening - AAROM, wall pushups, pro/ret/elev/dep.  hiding bead in red putty. Follow up on HEP.   Goals Short Term Goals Short Term Goal 1: Pt will be educated on  HEP Short Term Goal 1 Progress: Progressing toward goal Short Term Goal 2: Pt will improved RUE AROM to [facility] for increased ability to work towards casting a fishing line Short Term Goal 2 Progress: Progressing toward goal Short Term Goal 3: Pt will improve right grip strength by atleast 10# for improved ability to go back to work. Short Term Goal 3 Progress: Progressing toward goal Short Term Goal 4: Pt will engage in meal prep activity with less than 2 cues for safety. Short Term Goal 4 Progress: Progressing toward goal Short Term Goal 5: Pt will improve right pinch strength by atleast 3# for improved ability to go back to work. Short Term Goal 5 Progress: Progressing toward goal Long Term Goals Long Term Goal 1: Pt will achieve highest level of ADL, IADL, work, and leisure functioning using his RUE. Long Term Goal 1 Progress: Progressing toward goal Long Term Goal 2: Pt will improve RUE AROM to WNL for improved ability to cast a fishing line. Long Term Goal 2 Progress: Progressing toward goal Long Term Goal 3: pt will improve right grip strenght be atleast 20# for improved ability to go back to work. Long Term Goal 3 Progress: Progressing toward goal Long Term Goal 4: Pt will improve right pinch strength by atleast 6# for improved ability to engage in work related tasks. Long Term Goal 4 Progress: Progressing toward goal  Problem List Patient Active Problem List   Diagnosis Date Noted  . Muscle weakness (generalized) 12/08/2013  . Decreased range of motion of upper extremity 12/08/2013  . 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 Essential hypertension, benign 12/01/2012  . Hypogonadism male 12/01/2012    End of Session Activity Tolerance: Patient  tolerated treatment well General Behavior During Therapy: WFL for tasks assessed/performed OT Plan of Care OT Home Exercise Plan: Red theraputty for RUE strengthening OT Patient Instructions: Demonstrated and explained, pt  return-demonsttated. provided handout (scanned). Consulted and Agree with Plan of Care: Patient  [place], [place], OTR/L [phone]  12/11/2013, 12:05 PM

## 2013-12-11 NOTE — Progress Notes (Signed)
Patient notified and verbalized understanding. I sent in new med and put in BW orders.

## 2013-12-11 NOTE — Progress Notes (Signed)
Patient notified and verbalized understanding. I sent in new med and put in BW orders.  

## 2013-12-14 DIAGNOSIS — E785 Hyperlipidemia, unspecified: Secondary | ICD-10-CM | POA: Insufficient documentation

## 2013-12-14 DIAGNOSIS — I639 Cerebral infarction, unspecified: Secondary | ICD-10-CM | POA: Insufficient documentation

## 2013-12-14 DIAGNOSIS — I6930 Unspecified sequelae of cerebral infarction: Secondary | ICD-10-CM | POA: Insufficient documentation

## 2013-12-15 ENCOUNTER — Ambulatory Visit (HOSPITAL_COMMUNITY)
Admission: RE | Admit: 2013-12-15 | Discharge: 2013-12-15 | Disposition: A | Discharge status: Home / Self Care | Payer: Managed Care, Other (non HMO) | Source: Ambulatory Visit | Attending: *Deleted | Admitting: *Deleted

## 2013-12-15 NOTE — Progress Notes (Signed)
Physical Therapy Treatment Patient Details  Name: Craig Robertson MRN: [id number] Date of Birth: Jan 24, 1966  Today's Date: 12/15/2013 Time: 0867-6195 PT Time Calculation (min): 43 min Charges: Therex x 30'(0932-1002) NMR x 8'(1007-1015)  Visit#: 3 of 24  Re-eval: 01/07/14  Authorization: Rosann Auerbach  Authorization Visit#: 3 of 24   Subjective: Symptoms/Limitations Symptoms: Pt reports HEP compliance. Pain Assessment Currently in Pain?: Yes Pain Score: 5  Pain Location: Knee Pain Orientation: Right   Exercise/Treatments Stretches Active Hamstring Stretch Limitations: standing with 14" step 10X3" Gastroc Stretch: 3 reps;30 seconds;Limitations Gastroc Stretch Limitations: slant board Standing Forward Lunges: Limitations Forward Lunges Limitations: 8" box static, 10x each Other Standing Knee Exercises: 3D hip excursion 10x each Other Standing Knee Exercises: 10x squat without UE support   Balance Exercises Standing Standing, One Foot on a Step: 8 inch;Eyes open;3 reps;15 secs;Limitations Standing, One Foot on a Step Limitations: LLE on step RLE on ground Heel Raises: 10 reps Toe Raise: 10 reps   Physical Therapy Assessment and Plan PT Assessment and Plan Clinical Impression Statement: PTA facilitated activities to improve functional strength, stability and mobility. Pt requires multimodal cueing to avoid looking down when completing exercises. Began standing activity with LLE on 8" step to improve stability or RLE. Pt displays low activity tolerance and requires frequent seated rest breaks throughout session.  PT Plan: Continue to progress LE strength/stability per PT POC.     Problem List Patient Active Problem List   Diagnosis Date Noted  . Cerebrovascular accident (stroke) 12/14/2013  . Other and unspecified hyperlipidemia 12/14/2013  . Muscle weakness (generalized) 12/08/2013  . Decreased range of motion of upper extremity 12/08/2013  . Lack of coordination due to  stroke 12/08/2013  . Essential hypertension, benign 12/01/2012  . Hypogonadism male 12/01/2012    PT - End of Session Activity Tolerance: Patient tolerated treatment well General Behavior During Therapy: [facility] for tasks assessed/performed  Seth Bake, PTA  12/15/2013, 10:39 AM

## 2013-12-15 NOTE — Progress Notes (Signed)
Occupational Therapy Treatment Patient Details  Name: Craig Robertson MRN: [id number] Date of Birth: [birth year]  Today's Date: 12/15/2013 Time: 1024-1100 OT Time Calculation (min): 36 min Neuro Re-Ed 36'  Visit#: 3 of 16  Re-eval: 01/05/14    Authorization: Christella Scheuermann Managed  Authorization Time Period:    Authorization Visit#: 3 of    Subjective Symptoms/Limitations Symptoms: "i'm always in pain - it just doesn't get rid of the pain." Pain Assessment Currently in Pain?: Yes Pain Score: 5  Pain Location: Leg Pain Type:  (tingling, pins and needles)  Precautions/Restrictions     Exercise/Treatments Seated Elevation: AROM;15 reps Extension: AROM;15 reps Row: AROM;15 reps Protraction: AROM;15 reps Flexion: AAROM;10 reps (no rest break) Abduction: AAROM;10 reps (1 rest break at 8th)   ROM / Strengthening / Isometric Strengthening Wall Wash: 1 min each in flexion and abduction (increased fatieu with abduction) Wall Pushups: 10 reps Prot/Ret//Elev/Dep: 10 reps    Hand Exercises Theraputty - Flatten: red putty Theraputty - Grip: red putty Theraputty - Locate Pegs: red uptty - found 10 small beads in putty using pam and digits on right hand  Neurological Re-education Exercises Elbow Flexion: AROM;15 reps Elbow Extension: AROM;15 reps Forearm Supination: AROM;15 reps Forearm Pronation: AROM;15 reps  Grasp and Release Theraputty - Flatten: red putty Theraputty - Grip: red putty Theraputty - Locate Pegs: red uptty - found 10 small beads in putty using pam and digits on right hand     Occupational Therapy Assessment and Plan OT Assessment and Plan Clinical Impression Statement: Session focused more on right shoulder strengthening this session - increaed AROM and AAROM reps and added soulder stabilization exercises.  Pt fatigued, but had good tolerance and good motivation to continue pushing himself.  Pt able to find all 10 beads in red putty, with with fatigue.  Pt  vebalizes using putty at home for HEP. OT Plan: Increase wall wash time.  Add seated dowel AAROM to to HEP.   Goals Short Term Goals Short Term Goal 1: Pt will be educated on HEP Short Term Goal 1 Progress: Progressing toward goal Short Term Goal 2: Pt will improved RUE AROM to [facility] for increased ability to work towards casting a fishing line Short Term Goal 2 Progress: Progressing toward goal Short Term Goal 3: Pt will improve right grip strength by atleast 10# for improved ability to go back to work. Short Term Goal 3 Progress: Progressing toward goal Short Term Goal 4: Pt will engage in meal prep activity with less than 2 cues for safety. Short Term Goal 4 Progress: Progressing toward goal Short Term Goal 5: Pt will improve right pinch strength by atleast 3# for improved ability to go back to work. Short Term Goal 5 Progress: Progressing toward goal Long Term Goals Long Term Goal 1: Pt will achieve highest level of ADL, IADL, work, and leisure functioning using his RUE. Long Term Goal 1 Progress: Progressing toward goal Long Term Goal 2: Pt will improve RUE AROM to [facility] for improved ability to cast a fishing line. Long Term Goal 2 Progress: Progressing toward goal Long Term Goal 3: pt will improve right grip strenght be atleast 20# for improved ability to go back to work. Long Term Goal 3 Progress: Progressing toward goal Long Term Goal 4: Pt will improve right pinch strength by atleast 6# for improved ability to engage in work related tasks. Long Term Goal 4 Progress: Progressing toward goal  Problem List Patient Active Problem List   Diagnosis Date Noted  .  Cerebrovascular accident (stroke) 12/14/2013  . Other and unspecified hyperlipidemia 12/14/2013  . Muscle weakness (generalized) 12/08/2013  . Decreased range of motion of upper extremity 12/08/2013  . 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 Essential hypertension, benign 12/01/2012  . Hypogonadism male 12/01/2012     End of Session Activity Tolerance: Patient tolerated treatment well General Behavior During Therapy: [facility] for tasks assessed/performed  GO   Bea Graff, MS, OTR/L [phone]  12/15/2013, 11:05 AM

## 2013-12-16 ENCOUNTER — Ambulatory Visit (HOSPITAL_COMMUNITY)
Admission: RE | Admit: 2013-12-16 | Discharge: 2013-12-16 | Disposition: A | Discharge status: Home / Self Care | Payer: Managed Care, Other (non HMO) | Source: Ambulatory Visit | Attending: *Deleted | Admitting: *Deleted

## 2013-12-16 NOTE — Progress Notes (Signed)
Physical Therapy Treatment Patient Details  Name: Craig Robertson MRN: [id number] Date of Birth: Nov 09, 1965  Today's Date: 12/16/2013 Time: 1345-1430 PT Time Calculation (min): 45 min Charges: Therex x 40'  Visit#: 4 of 24  Re-eval: 01/07/14  Authorization: Rosann Auerbach  Authorization Visit#: 4 of 24   Subjective: Symptoms/Limitations Symptoms: Pt states that he feels like the exercises are helping. Pain Assessment Currently in Pain?: Yes Pain Score: 4  Pain Location: Knee Pain Orientation: Right  Exercise/Treatments Stretches Active Hamstring Stretch Limitations: standing with 14" step 10X3" Gastroc Stretch: 3 reps;30 seconds;Limitations Gastroc Stretch Limitations: slant board Aerobic Stationary Bike: NuStep 10 minutes LE and UE L4 hills#3 Standing Forward Lunges: Limitations Forward Lunges Limitations: 8" box static, 10x each Rocker Board: 2 minutes;Limitations Rocker Board Limitations: right/left ant/post Other Standing Knee Exercises: Toe taps alternating 8" box Other Standing Knee Exercises: 3D hip excursion 10x each; 10x squat without UE support   Physical Therapy Assessment and Plan PT Assessment and Plan Clinical Impression Statement: PTA facilitated activities to improve functional strength, stability and mobility. Pt displays improved strength and stability in RLE this session. Began toe taps on 8" box to improve RLE coordination. Pt requires two seated rest breaks during session. Completed NuStep at 70 steps per minute to improve activity tolerance.  PT Plan: Continue to progress LE strength/stability per PT POC.    Problem List Patient Active Problem List   Diagnosis Date Noted  . Cerebrovascular accident (stroke) 12/14/2013  . Other and unspecified hyperlipidemia 12/14/2013  . Muscle weakness (generalized) 12/08/2013  . Decreased range of motion of upper extremity 12/08/2013  . 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 Essential hypertension, benign  12/01/2012  . Hypogonadism male 12/01/2012    PT - End of Session Activity Tolerance: Patient tolerated treatment well General Behavior During Therapy: [facility] for tasks assessed/performed  Seth Bake, PTA 12/16/2013, 3:16 PM

## 2013-12-17 ENCOUNTER — Ambulatory Visit: Payer: Managed Care, Other (non HMO) | Admitting: Family Medicine

## 2013-12-18 ENCOUNTER — Ambulatory Visit (HOSPITAL_COMMUNITY)
Admission: RE | Admit: 2013-12-18 | Discharge: 2013-12-18 | Disposition: A | Discharge status: Home / Self Care | Payer: Managed Care, Other (non HMO) | Source: Ambulatory Visit | Attending: Family Medicine | Admitting: Family Medicine

## 2013-12-18 DIAGNOSIS — I6350 Cerebral infarction due to unspecified occlusion or stenosis of unspecified cerebral artery: Secondary | ICD-10-CM | POA: Insufficient documentation

## 2013-12-18 NOTE — Progress Notes (Addendum)
Physical Therapy Treatment Patient Details  Name: Epifania GoreSteven D Garrido MRN: [id number] Date of Birth: 1966/07/19  Today's Date: 12/18/2013 Time: 4098-11911725-1821 PT Time Calculation (min): 56 min Charge: TE [id number]  Visit#: 5 of 24  Re-eval: 01/07/14 Assessment Diagnosis: Rt sided LE weakness SP CVA.  Next MD Visit: Neurologist June 3; Lukin-  Authorization: [employer]  Authorization Time Period:    Authorization Visit#: 5 of 24   Subjective: Symptoms/Limitations Symptoms: Pt walked into dept stating he is a new man today, woke up pain free today.  MD apt next Wed, pt states he is ready to start driving.  Pt stated most difficultycurrently is his endurance. Pain Assessment Currently in Pain?: No/denies  Objective:  Exercise/Treatments Stretches Gastroc Stretch: 3 reps;30 seconds;Limitations Gastroc Stretch Limitations: slant board Aerobic Stationary Bike: NuStep 8 minutes LE only for strengthening and activity tolerance  L4 hills#3 Standing Forward Lunges: Limitations;Both;10 reps Forward Lunges Limitations: 8" box static; BOSU alternating 5" holds 10 reps each Side Lunges: Both;10 reps;Limitations Side Lunges Limitations: on 8in step and BOSU Bil LE 10res Lateral Step Up: Both;10 reps;Hand Hold: 1;Step Height: 6" Forward Step Up: Both;10 reps;Hand Hold: 0;Step Height: 6" Step Down: Both;10 reps;Hand Hold: 0;Step Height: 6" Functional Squat: 15 reps Other Standing Knee Exercises: high march and hold 5" alternating LE on airex 10 reps Other Standing Knee Exercises: 3D hip excursion 10x each     Physical Therapy Assessment and Plan PT Assessment and Plan Clinical Impression Statement: Progressed to dynamic surface for functional strengthening and stabiltiy.  PTA facilitation to improve hip mobility for maximum beneifts.  Began stair training following discussion with pt about stairs in home, able to demonstate appropriate form and technique with new activtiies.  Noted weak eccentric  quad control descending steps. Pt able to complete all exercises with no c/o pain, limited by fatigue at end of session.  No rest breaks through session..  Pt able to complete average 112 SPM on nustep at end of session for LE strengthening and activity tolerance.   PT Plan: Progress note next session prior MD apt on 12/23/2013.  Continue with current POC.  Progress functional strengtening/stability activities with BOSU and dynamic surfaces.  Pt encouraged to begin walking more, f/u with activtiy tolerance next session.      Goals PT Short Term Goals PT Short Term Goal 1: Patient Will demsontrate improved Rt knee extension and  hip extenisno strength to 4+/5 so patient can  perform sit to stand from chair withotu UE support PT Short Term Goal 1 - Progress: Progressing toward goal PT Short Term Goal 2: Patient will demsontrate improved Rt hamstring strength to 4/5 so patient can control descent when walkign down a hill PT Short Term Goal 2 - Progress: Progressing toward goal PT Short Term Goal 3: Patietn will demsontrated improved hip abduction strength to 4/5 so patient can evenely weight shift during gait PT Short Term Goal 3 - Progress: Progressing toward goal PT Long Term Goals PT Long Term Goal 1: Patient Will demsontrate improved Rt knee extension and  hip extenisno strength to 5/5 so patient can ambulate up a flight of stairs withotu UE support PT Long Term Goal 2: Patient will demsontrate improved Rt hamstring strength to 5/5 so patient can control descent when walkign down stairs Long Term Goal 3: Patient will demsontrate improved hip abduction strength to 5/5 so patient can perform single leg stand bilaterally for greater than 10 seconds  Problem List Patient Active Problem List   Diagnosis Date Noted  .  Cerebrovascular accident (stroke) 12/14/2013  . Other and unspecified hyperlipidemia 12/14/2013  . Muscle weakness (generalized) 12/08/2013  . Decreased range of motion of upper  extremity 12/08/2013  . 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 Essential hypertension, benign 12/01/2012  . Hypogonadism male 12/01/2012    PT - End of Session Activity Tolerance: Patient tolerated treatment well General Behavior During Therapy: [facility] for tasks assessed/performed  GP    Juel Burrow 12/18/2013, 6:34 PM

## 2013-12-22 ENCOUNTER — Ambulatory Visit (HOSPITAL_COMMUNITY)
Admission: RE | Admit: 2013-12-22 | Discharge: 2013-12-22 | Disposition: A | Discharge status: Home / Self Care | Payer: Managed Care, Other (non HMO) | Source: Ambulatory Visit | Attending: Family Medicine | Admitting: Family Medicine

## 2013-12-22 DIAGNOSIS — I6350 Cerebral infarction due to unspecified occlusion or stenosis of unspecified cerebral artery: Secondary | ICD-10-CM | POA: Insufficient documentation

## 2013-12-22 NOTE — Evaluation (Signed)
Physical Therapy Reassessment  Patient Details  Name: Craig Robertson MRN: [id number] Date of Birth: December 29, 1965  Today's Date: 12/22/2013 Time: 4098-1191 PT Time Calculation (min): 40 min     Charges: therEx [id number]         Visit#: 6 of 24  Re-eval: 01/07/14 Assessment Diagnosis: Rt sided LE weakness SP CVA.  Next MD Visit: Neurologist June 3; [place]-  Authorization: Rosann Auerbach    Authorization Visit#: 6 of 24   Past Medical History:  Past Medical History  Diagnosis Date  . Hypertension   . ED (erectile dysfunction)   . Hyperlipidemia   . Low testosterone    Past Surgical History: No past surgical history on file.  Subjective Symptoms/Limitations Symptoms: patient states that he has had increased dizziness today, but no head ache, recently sick over the weekend, BOP 140/80 no pain.  Pain Assessment Currently in Pain?: No/denies  Sensation/Coordination/Flexibility/Functional Tests Functional Tests Functional Tests: Gait patient ambulates with Rt minor Rt trendelenberg and toed out.  Functional Tests: Stairs: to be tested next session.   Assessment RLE Strength Right Hip Flexion: 4/5 Right Hip Extension: 3+/5 Right Hip ABduction: 3+/5 Right Hip ADduction: 3+/5 Right Knee Flexion: 3+/5 Right Knee Extension: 4/5 Right Ankle Dorsiflexion: 4/5  Exercise/Treatments Stretches Active Hamstring Stretch Limitations: standing with 14" step 10X3" Piriformis Stretch: 20 seconds;3 reps Gastroc Stretch: 3 reps;30 seconds;Limitations Gastroc Stretch Limitations: slant board Aerobic Stationary Bike: NuStep 5 minutes LE only for strengthening and activity tolerance  L4 hills#3 Standing Forward Lunges: Limitations;Both;10 reps Forward Lunges Limitations: 8" box and floor static 10 reps each Side Lunges: Both;10 reps;Limitations Side Lunges Limitations: on 8in step and on floor 10res Other Standing Knee Exercises: frontal plane squat walk with green Tband, 25ft, reverse monster  walk with gree Tband 69ft   Physical Therapy Assessment and Plan PT Assessment and Plan Clinical Impression Statement: Patient displays improving LE strength and stability and was able to tolerate increased depth of loading and increased functional mobility exercises with lunges progressed to being performed on the floor.  Patient tolerated squat walking well with patient able to demonstrate improved gait following exercise with only minor Trendelenburg gait and Rt knee instability during gait.  PT Plan: Continue to progress functional mobility and strength. Progress to stair training next session with increasing step heights to progress single leg strength.     Goals PT Short Term Goals PT Short Term Goal 1: Patient Will demsontrate improved Rt knee extension and  hip extenisno strength to 4+/5 so patient can  perform sit to stand from chair withotu UE support PT Short Term Goal 1 - Progress: Progressing toward goal PT Short Term Goal 2: Patient will demsontrate improved Rt hamstring strength to 4/5 so patient can control descent when walkign down a hill PT Short Term Goal 2 - Progress: Progressing toward goal PT Short Term Goal 3: Patietn will demsontrated improved hip abduction strength to 4/5 so patient can evenely weight shift during gait PT Short Term Goal 3 - Progress: Progressing toward goal PT Long Term Goals PT Long Term Goal 1: Patient Will demsontrate improved Rt knee extension and  hip extenisno strength to 5/5 so patient can ambulate up a flight of stairs withotu UE support PT Long Term Goal 1 - Progress: Progressing toward goal PT Long Term Goal 2: Patient will demsontrate improved Rt hamstring strength to 5/5 so patient can control descent when walkign down stairs PT Long Term Goal 2 - Progress: Progressing toward goal Long Term Goal 3:  Patient will demsontrate improved hip abduction strength to 5/5 so patient can perform single leg stand bilaterally for greater than 10  seconds Long Term Goal 3 Progress: Progressing toward goal  Problem List Patient Active Problem List   Diagnosis Date Noted  . Cerebrovascular accident (stroke) 12/14/2013  . Other and unspecified hyperlipidemia 12/14/2013  . Muscle weakness (generalized) 12/08/2013  . Decreased range of motion of upper extremity 12/08/2013  . 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 Essential hypertension, benign 12/01/2012  . Hypogonadism male 12/01/2012    PT - End of Session Activity Tolerance: Patient tolerated treatment well General Behavior During Therapy: [facility] for tasks assessed/performed  GP    Leida Luton R Radwan Cowley PT DPT 12/22/2013, 12:46 PM  Physician Documentation Your signature is required to indicate approval of the treatment plan as stated above.  Please sign and either send electronically or make a copy of this report for your files and return this physician signed original.   Please mark one 1.__approve of plan  2. ___approve of plan with the following conditions.   ______________________________                                                          _____________________ Physician Signature                                                                                                             Date

## 2013-12-24 ENCOUNTER — Ambulatory Visit (HOSPITAL_COMMUNITY)
Admission: RE | Admit: 2013-12-24 | Discharge: 2013-12-24 | Disposition: A | Discharge status: Home / Self Care | Payer: Managed Care, Other (non HMO) | Source: Ambulatory Visit | Attending: *Deleted | Admitting: *Deleted

## 2013-12-24 ENCOUNTER — Ambulatory Visit (HOSPITAL_COMMUNITY)
Admission: RE | Admit: 2013-12-24 | Discharge: 2013-12-24 | Disposition: A | Discharge status: Home / Self Care | Payer: Managed Care, Other (non HMO) | Source: Ambulatory Visit

## 2013-12-24 DIAGNOSIS — M6281 Muscle weakness (generalized): Secondary | ICD-10-CM

## 2013-12-24 DIAGNOSIS — IMO0002 Reserved for concepts with insufficient information to code with codable children: Secondary | ICD-10-CM

## 2013-12-24 NOTE — Progress Notes (Signed)
Physical Therapy Treatment Patient Details  Name: Craig Robertson MRN: [id number] Date of Birth: Jul 05, 1966  Today's Date: 12/24/2013 Time: 1152-1230 PT Time Calculation (min): 38 min Charge: TE [id number]  Visit#: 7 of 24  Re-eval: 01/07/14 Assessment Diagnosis: Rt sided LE weakness SP CVA.  Next MD Visit: Neurologist 01/26/2014,[place]-  Authorization: [employer]  Authorization Time Period:    Authorization Visit#: 7 of 24   Subjective: Symptoms/Limitations Symptoms: MD apt yesterday, increase vitamin D and referred to neurologist on 01/26/2014, increased BP medication, aspirin, pain free today Pain Assessment Currently in Pain?: No/denies  Precautions/Restrictions  Precautions Precautions: None  Exercise/Treatments Stretches Active Hamstring Stretch Limitations: standing with 14" step 3x 10" Gastroc Stretch: 3 reps;30 seconds;Limitations Gastroc Stretch Limitations: slant board Standing Forward Lunges: Limitations;Both;15 reps Forward Lunges Limitations: on BOSU Side Lunges: Both;15 reps;Limitations Side Lunges Limitations: on BOSU Lateral Step Up: Both;15 reps;Hand Hold: 1;Step Height: 6" Stairs: 2 RT reciprocal pattern Other Standing Knee Exercises: balance beam tandem, retro and sidestepping Other Standing Knee Exercises: sidestepping in squat position with blue tband around knees 1RT    Physical Therapy Assessment and Plan PT Assessment and Plan Clinical Impression Statement: Stair training complete with supervision, pt able to demonstrate appropriate reciprocial pattern ascending and descending with 1 handrail.  Noted decreased eccentric control descending stairwell from quad weakness, no LOB during stair training.  Continued gluteal strengthening exercises on dynamic surfaced for stabiltiy strengthein.  Pt limited by fatigue, no reports of pain through session.   PT Plan: Continue to progress functional mobilty and strengthening.  Progress to single leg activtiies ex:  vector stance on airex.  Progress to cybex machines for stregnthening.    Goals PT Short Term Goals PT Short Term Goal 1: Patient Will demsontrate improved Rt knee extension and  hip extenisno strength to 4+/5 so patient can  perform sit to stand from chair withotu UE support PT Short Term Goal 1 - Progress: Progressing toward goal PT Short Term Goal 2: Patient will demsontrate improved Rt hamstring strength to 4/5 so patient can control descent when walkign down a hill PT Short Term Goal 2 - Progress: Progressing toward goal PT Short Term Goal 3: Patietn will demsontrated improved hip abduction strength to 4/5 so patient can evenely weight shift during gait PT Short Term Goal 3 - Progress: Progressing toward goal PT Long Term Goals PT Long Term Goal 1: Patient Will demsontrate improved Rt knee extension and  hip extenisno strength to 5/5 so patient can ambulate up a flight of stairs withotu UE support PT Long Term Goal 2: Patient will demsontrate improved Rt hamstring strength to 5/5 so patient can control descent when walkign down stairs Long Term Goal 3: Patient will demsontrate improved hip abduction strength to 5/5 so patient can perform single leg stand bilaterally for greater than 10 seconds  Problem List Patient Active Problem List   Diagnosis Date Noted  . Cerebrovascular accident (stroke) 12/14/2013  . Other and unspecified hyperlipidemia 12/14/2013  . Muscle weakness (generalized) 12/08/2013  . Decreased range of motion of upper extremity 12/08/2013  . 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 Essential hypertension, benign 12/01/2012  . Hypogonadism male 12/01/2012    PT - End of Session Activity Tolerance: Patient tolerated treatment well;Patient limited by fatigue General Behavior During Therapy: [facility] for tasks assessed/performed  GP    Juel Burrow 12/24/2013, 3:13 PM

## 2013-12-24 NOTE — Progress Notes (Signed)
Occupational Therapy Treatment Patient Details  Name: Craig Robertson MRN: [id number] Date of Birth: 1966-02-12  Today's Date: 12/24/2013 Time: 2947-6546 OT Time Calculation (min): 38 min Therex 5035-4656 38'  Visit#: 4 of 16  Re-eval: 01/05/14    Authorization: Rosann Auerbach Managed  Authorization Time Period:    Authorization Visit#: 4 of    Subjective Symptoms/Limitations Symptoms: S: My shoulder is a little stiff. Pain Assessment Currently in Pain?: No/denies  Precautions/Restrictions  Precautions Precautions: None  Exercise/Treatments Seated Horizontal ABduction: AAROM;15 reps External Rotation: AAROM;15 reps Internal Rotation: AAROM;15 reps Flexion: AAROM;15 reps Abduction: AAROM;15 reps Other Seated Exercises: Core strengthening exercises: Yellow weighted ball; 15X; shoulder flexion, PNF patterns (chopping wood), push press ROM / Strengthening / Isometric Strengthening UBE (Upper Arm Bike): 3.0 5' forward Cybex Press: 2 plate;20 reps Cybex Row: 2 plate;20 reps Wall Wash: 2'    Neurological Re-education Exercises Hand Gripper with Medium Beads: 6 beads; black handgripper Hand Gripper with Small Beads: 6 beads; black handgripper   Occupational Therapy Assessment and Plan OT Assessment and Plan Clinical Impression Statement: A: Added AAROM to HEP. Reviewed exercises. Patient demonstrated well with minimal need for cues for form. Added core strengthening exercises while seated. Added UBE and Cybex row/press. Patient completed well with some fatigue requiring rest breaks when needed.  OT Plan: P: Grip and pinch task. Arm strengthening using 1# weight if able.   Goals Short Term Goals Time to Complete Short Term Goals: 4 weeks Short Term Goal 1: Pt will be educated on HEP Short Term Goal 1 Progress: Progressing toward goal Short Term Goal 2: Pt will improved RUE AROM to [facility] for increased ability to work towards casting a fishing line Short Term Goal 2 Progress:  Progressing toward goal Short Term Goal 3: Pt will improve right grip strength by atleast 10# for improved ability to go back to work. Short Term Goal 3 Progress: Progressing toward goal Short Term Goal 4: Pt will engage in meal prep activity with less than 2 cues for safety. Short Term Goal 4 Progress: Progressing toward goal Short Term Goal 5: Pt will improve right pinch strength by atleast 3# for improved ability to go back to work. Short Term Goal 5 Progress: Progressing toward goal Long Term Goals Time to Complete Long Term Goals: 8 weeks Long Term Goal 1: Pt will achieve highest level of ADL, IADL, work, and leisure functioning using his RUE. Long Term Goal 1 Progress: Progressing toward goal Long Term Goal 2: Pt will improve RUE AROM to WNL for improved ability to cast a fishing line. Long Term Goal 2 Progress: Progressing toward goal Long Term Goal 3: pt will improve right grip strenght be atleast 20# for improved ability to go back to work. Long Term Goal 3 Progress: Progressing toward goal Long Term Goal 4: Pt will improve right pinch strength by atleast 6# for improved ability to engage in work related tasks. Long Term Goal 4 Progress: Progressing toward goal  Problem List Patient Active Problem List   Diagnosis Date Noted  . Cerebrovascular accident (stroke) 12/14/2013  . Other and unspecified hyperlipidemia 12/14/2013  . Muscle weakness (generalized) 12/08/2013  . Decreased range of motion of upper extremity 12/08/2013  . 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 Essential hypertension, benign 12/01/2012  . Hypogonadism male 12/01/2012    End of Session Activity Tolerance: Patient tolerated treatment well General Behavior During Therapy: [facility] for tasks assessed/performed   Limmie Patricia, OTR/L,CBIS   12/24/2013, 1:50 PM

## 2013-12-25 ENCOUNTER — Ambulatory Visit (HOSPITAL_COMMUNITY)
Admission: RE | Admit: 2013-12-25 | Discharge: 2013-12-25 | Disposition: A | Discharge status: Home / Self Care | Payer: Managed Care, Other (non HMO) | Source: Ambulatory Visit | Attending: Family Medicine | Admitting: Family Medicine

## 2013-12-25 NOTE — Progress Notes (Signed)
Occupational Therapy Treatment Patient Details  Name: Craig Robertson MRN: [id number] Date of Birth: 03/06/1966  Today's Date: 12/25/2013 Time: 7322-0254 OT Time Calculation (min): 44 min Therapeutic Exercises 44'  Visit#: 5 of 16  Re-eval: 01/05/14    Authorization: Rosann Auerbach Managed  Authorization Time Period:    Authorization Visit#: 5 of    Subjective Symptoms/Limitations Symptoms: S: No, I m just sore.  I"ve been trying to stop taking those pain pills, and just do tylenol.. Pain Assessment Currently in Pain?: No/denies   Exercise/Treatments Seated Protraction: AAROM;15 reps;Weights;Strengthening Protraction Weight (lbs): 1 Horizontal ABduction: AAROM;15 reps;Strengthening;Weights Horizontal ABduction Weight (lbs): 1 External Rotation: AAROM;15 reps;Strengthening;Weights External Rotation Weight (lbs): 1 Internal Rotation: AAROM;15 reps;Right;Weights Internal Rotation Weight (lbs): 1 Flexion: AAROM;15 reps;Strengthening;Weights Flexion Weight (lbs): 1 Abduction: AAROM;15 reps;Strengthening;Weights ABduction Weight (lbs): 1 Other Seated Exercises: Core strengthening exercises: Yellow weighted ball; 15X; shoulder flexion, PNF patterns both directions (chopping wood), push press   ROM / Strengthening / Isometric Strengthening Other ROM/Strengthening Exercises: Quadruped on knees on mat.  10 pushups. On blue foam 10 anterior-posterior weight shifts and 10 lateral wieght shifts (moving hands on/off blue foam).   Hand Exercises Other Hand Exercises: Resisted clothespins on tree - green, blue and read Other Hand Exercises: fine motor coordination - using knut bolt board to screw/unsrew 8 knuts/washers. Unscrewing from lower top cabinet shelf, and rescrewing on upper top cabinet shelf with 1.5 wrist weight.      Occupational Therapy Assessment and Plan OT Assessment and Plan Clinical Impression Statement: Added 1 lb to standing exercisees this session, and pt had good form.   Pt engaged in overhead reaching pinch and fine motor tasks with geed fesults and min fatigue.  Added quadruped exercises this session - pt fatigued, but had good tolerance. OT Plan: Grip strengthening task.  Quadruped exercises.   Goals Short Term Goals Short Term Goal 1: Pt will be educated on HEP Short Term Goal 1 Progress: Progressing toward goal Short Term Goal 2: Pt will improved RUE AROM to [facility] for increased ability to work towards casting a fishing line Short Term Goal 2 Progress: Progressing toward goal Short Term Goal 3: Pt will improve right grip strength by atleast 10# for improved ability to go back to work. Short Term Goal 3 Progress: Progressing toward goal Short Term Goal 4: Pt will engage in meal prep activity with less than 2 cues for safety. Short Term Goal 4 Progress: Progressing toward goal Short Term Goal 5: Pt will improve right pinch strength by atleast 3# for improved ability to go back to work. Short Term Goal 5 Progress: Progressing toward goal Long Term Goals Long Term Goal 1: Pt will achieve highest level of ADL, IADL, work, and leisure functioning using his RUE. Long Term Goal 1 Progress: Progressing toward goal Long Term Goal 2: Pt will improve RUE AROM to [facility] for improved ability to cast a fishing line. Long Term Goal 2 Progress: Progressing toward goal Long Term Goal 3: pt will improve right grip strenght be atleast 20# for improved ability to go back to work. Long Term Goal 3 Progress: Progressing toward goal Long Term Goal 4: Pt will improve right pinch strength by atleast 6# for improved ability to engage in work related tasks. Long Term Goal 4 Progress: Progressing toward goal  Problem List Patient Active Problem List   Diagnosis Date Noted  . Cerebrovascular accident (stroke) 12/14/2013  . Other and unspecified hyperlipidemia 12/14/2013  . Muscle weakness (generalized) 12/08/2013  . Decreased  range of motion of upper extremity 12/08/2013  . Lack of  coordination due to stroke 12/08/2013  . Essential hypertension, benign 12/01/2012  . Hypogonadism male 12/01/2012    End of Session Activity Tolerance: Patient tolerated treatment well General [facility] Therapy[facility] assessed/[facility]  GO   Marry GuanMarie Rawlings, MS, OTR/L [phone]  12/25/2013, 10:16 AM

## 2013-12-25 NOTE — Progress Notes (Signed)
Physical Therapy Treatment Patient Details  Name: Craig Robertson MRN: [id number] Date of Birth: January 20, 1966  Today's Date: 12/25/2013 Time: 5498-2641 PT Time Calculation (min): 43 min Charge: TE 5830-9407  Visit#: 8 of 24  Re-eval: 01/07/14 Assessment Diagnosis: Rt sided LE weakness SP CVA.  Next MD Visit: Neurologist 01/26/2014,Lukin- 02/09/2014 blood work  Authorization: Medical sales representative  Authorization Time Period:    Authorization Visit#: 8 of 24   Subjective: Symptoms/Limitations Symptoms: Pt stated he was a little sore following last session, difficulty sleeping last night believed to be related to change in medication.and is no longer taking pain medication besides Tylonel.  Pt stated he is ready to drive again, neurologist apt 01/26/2014. Pain Assessment Currently in Pain?: No/denies  Precautions/Restrictions  Precautions Precautions: None  Exercise/Treatments Stretches Active Hamstring Stretch: Limitations Active Hamstring Stretch Limitations: standing with 14" step 3x 30" Gastroc Stretch: 3 reps;30 seconds;Limitations Gastroc Stretch Limitations: Training and development officer for Strengthening Cybex Knee Extension: 22.5Pl 15x Cybex Knee Flexion: 4Pl 15x Cybex Leg Press: 5Pl 15x Standing Forward Lunges: Limitations;Both;15 reps Forward Lunges Limitations: on BOSU Side Lunges: Both;15 reps;Limitations Side Lunges Limitations: on BOSU lateral lunge Stairs: 2 RT reciprocal pattern SLS with Vectors: 2x 5" Bil LE on airex Other Standing Knee Exercises: 3D hip excursion 15x    Physical Therapy Assessment and Plan PT Assessment and Plan Clinical Impression Statement: Pt progressing well towards functional strength and overall stabilty.  Progressed to cybex machines for LE strengthening and began vector stance on dynamic plane for stabiltiy.  Pt limited by fatigue with new activtieis, no reports of pain.  Ended session with stretches for mm lengthening to assist with soreness.   PT Plan:  Continue to progress functional mobilty and strengthening.     Goals PT Short Term Goals PT Short Term Goal 1: Patient Will demsontrate improved Rt knee extension and  hip extenisno strength to 4+/5 so patient can  perform sit to stand from chair withotu UE support PT Short Term Goal 2: Patient will demsontrate improved Rt hamstring strength to 4/5 so patient can control descent when walkign down a hill PT Short Term Goal 2 - Progress: Progressing toward goal PT Short Term Goal 3: Patietn will demsontrated improved hip abduction strength to 4/5 so patient can evenely weight shift during gait PT Short Term Goal 3 - Progress: Progressing toward goal PT Long Term Goals PT Long Term Goal 1: Patient Will demsontrate improved Rt knee extension and  hip extenisno strength to 5/5 so patient can ambulate up a flight of stairs withotu UE support PT Long Term Goal 2: Patient will demsontrate improved Rt hamstring strength to 5/5 so patient can control descent when walkign down stairs Long Term Goal 3: Patient will demsontrate improved hip abduction strength to 5/5 so patient can perform single leg stand bilaterally for greater than 10 seconds  Problem List Patient Active Problem List   Diagnosis Date Noted  . Cerebrovascular accident (stroke) 12/14/2013  . Other and unspecified hyperlipidemia 12/14/2013  . Muscle weakness (generalized) 12/08/2013  . Decreased range of motion of upper extremity 12/08/2013  . 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 Essential hypertension, benign 12/01/2012  . Hypogonadism male 12/01/2012    PT - End of Session Activity Tolerance: Patient tolerated treatment well General Behavior During Therapy: [facility] for tasks assessed/performed  GP    Juel Burrow 12/25/2013, 9:29 AM

## 2013-12-29 ENCOUNTER — Ambulatory Visit (HOSPITAL_COMMUNITY)
Admission: RE | Admit: 2013-12-29 | Discharge: 2013-12-29 | Disposition: A | Discharge status: Home / Self Care | Payer: Managed Care, Other (non HMO) | Source: Ambulatory Visit | Attending: Family Medicine | Admitting: Family Medicine

## 2013-12-29 DIAGNOSIS — M6281 Muscle weakness (generalized): Secondary | ICD-10-CM

## 2013-12-29 DIAGNOSIS — IMO0002 Reserved for concepts with insufficient information to code with codable children: Secondary | ICD-10-CM

## 2013-12-29 NOTE — Progress Notes (Signed)
Physical Therapy Treatment Patient Details  Name: Craig Robertson MRN: [id number] Date of Birth: [birth year]  Today's Date: 12/29/2013 Time: [phone] PT Time Calculation (min): 45 min        Charges: TherEx [phone] Visit#: 9 of 24  Re-eval: 01/07/14 Assessment Diagnosis: [name] sided LE weakness SP CVA.  Next MD Visit: Neurologist 01/26/2014,Lukin- 02/09/2014 blood work  Authorization: [employer]  Authorization Visit#: 9 of 24   Subjective: Symptoms/Limitations Symptoms: patient states soreness/pain in Low back. patient later noted right knee pain that was extinguised with [name] hamstring stretching.  Pain Assessment Currently in Pain?: Yes Pain Score: 6  Pain Location: Back Pain Orientation: Lower;Medial  Exercise/Treatments Stretches Active Hamstring Stretch: Limitations Active Hamstring Stretch Limitations: standing with 14" step 2x 20" 2 way with lateral hamstring focus.   Piriformis Stretch: Limitations;10 seconds Piriformis Stretch Limitations: 10x Gastroc Stretch: 3 reps;30 seconds;Limitations Gastroc Stretch Limitations: slant board Standing Forward Lunges Limitations: Lunge matrix common on floor 5x.  Lateral Step Up: Step Height: 8";10 reps (with knee drive) Forward Step Up: Step Height: 8";10 reps (with knee drive) SLS: Single leg balance reach matrix common with slider 5x.  Other Standing Knee Exercises: 3D hip excursion in neutral and split stance 10x Other Standing Knee Exercises: sidestepping in squat position with blue tband around knees 1RT  Physical Therapy Assessment and Plan PT Assessment and Plan Clinical Impression Statement: Patient arrived with complaint of increased low back pain this session in addition to [name] knee pain. [name] knee pain attributed to limited lateral hamstring and piriformis mobility resulting in excessive hip external rotation. Patient noted improved low back and hip pain following LE stretches and standing split stance hip excursions. Patient  demonstrated good performance of single leg balance reaching with slider but was unable to perform it without a slider when balancing on the [name] vs the Lt. Continue focus on increasing glut med/max strength and balancing.    Goals PT Short Term Goals PT Short Term Goal 1: Patient Will demsontrate improved [name] knee extension and  hip extenisno strength to 4+/5 so patient can  perform sit to stand from chair withotu UE support PT Short Term Goal 1 - Progress: Progressing toward goal PT Short Term Goal 2: Patient will demsontrate improved [name] hamstring strength to 4/5 so patient can control descent when walkign down a hill PT Short Term Goal 2 - Progress: Progressing toward goal PT Short Term Goal 3: Patietn will demsontrated improved hip abduction strength to 4/5 so patient can evenely weight shift during gait PT Short Term Goal 3 - Progress: Progressing toward goal PT Long Term Goals PT Long Term Goal 1: Patient Will demsontrate improved [name] knee extension and  hip extenisno strength to 5/5 so patient can ambulate up a flight of stairs withotu UE support PT Long Term Goal 1 - Progress: Progressing toward goal PT Long Term Goal 2: Patient will demsontrate improved [name] hamstring strength to 5/5 so patient can control descent when walkign down stairs PT Long Term Goal 2 - Progress: Progressing toward goal Long Term Goal 3: Patient will demsontrate improved hip abduction strength to 5/5 so patient can perform single leg stand bilaterally for greater than 10 seconds Long Term Goal 3 Progress: Progressing toward goal  Problem List Patient Active Problem List   Diagnosis Date Noted  . Cerebrovascular accident (stroke) 12/14/2013  . Other and unspecified hyperlipidemia 12/14/2013  . Muscle weakness (generalized) 12/08/2013  . Decreased range of motion of upper extremity 12/08/2013  . Lack of coordination  due to stroke 12/08/2013  . Essential hypertension, benign 12/01/2012  . Hypogonadism male  12/01/2012    PT - End of Session Activity Tolerance: Patient tolerated treatment well General Behavior During Therapy: WFL for tasks assessed/performed PT Plan of Care PT Home Exercise Plan: 3DE hip Excursions, and Squats with UE support, Frontal plane squat walk with yellow ball and blue theraband 53ft.   GP    Craig Robertson Craig Robertson 12/29/2013, 10:19 AM

## 2013-12-29 NOTE — Progress Notes (Signed)
Occupational Therapy Treatment Patient Details  Name: Craig Robertson MRN: [id number] Date of Birth: October 08, 1965  Today's Date: 12/29/2013 Time: 1478-2956 OT Time Calculation (min): 45 min Theract [phone] 30' Therex 578-469 15'  Visit#: 6 of 16  Re-eval: 01/05/14    Authorization: Rosann Auerbach Managed  Authorization Time Period:    Authorization Visit#: 6 of    Subjective Symptoms/Limitations Symptoms: S: My back so stiff today. Pain Assessment Currently in Pain?: No/denies  Precautions/Restrictions  Precautions Precautions: None  Exercise/Treatments Prone  Other Prone Exercises: Cat and crow pose, downward dog, cobra pose, 10X to increase core strength, BUE strength and stretch back muscles ROM / Strengthening / Isometric Strengthening Other ROM/Strengthening ExercisesPersonnel officer bike; level 1; 8'   Neurological Re-education Grasp and Release Grasp and Release:  (used pvc pipe to push into putty) Theraputty - Flatten: green - standing Theraputty - Roll: green Theraputty - Grip: green Theraputty - Pinch: green Theraputty - Locate Pegs: green - 5 beads   Occupational Therapy Assessment and Plan OT Assessment and Plan Clinical Impression Statement: A: Added Air force bike and completed quadraped excises on mat to decrease back stiffness and increase core strength and BUE shoulder strength and stability.  OT Plan: P: Cont to work on shoulder stability and core strengthening.    Goals Short Term Goals Time to Complete Short Term Goals: 4 weeks Short Term Goal 1: Pt will be educated on HEP Short Term Goal 1 Progress: Progressing toward goal Short Term Goal 2: Pt will improved RUE AROM to [facility] for increased ability to work towards casting a fishing line Short Term Goal 2 Progress: Progressing toward goal Short Term Goal 3: Pt will improve right grip strength by atleast 10# for improved ability to go back to work. Short Term Goal 3 Progress: Progressing toward goal Short Term  Goal 4: Pt will engage in meal prep activity with less than 2 cues for safety. Short Term Goal 4 Progress: Progressing toward goal Short Term Goal 5: Pt will improve right pinch strength by atleast 3# for improved ability to go back to work. Short Term Goal 5 Progress: Progressing toward goal Long Term Goals Time to Complete Long Term Goals: 8 weeks Long Term Goal 1: Pt will achieve highest level of ADL, IADL, work, and leisure functioning using his RUE. Long Term Goal 1 Progress: Progressing toward goal Long Term Goal 2: Pt will improve RUE AROM to [facility] for improved ability to cast a fishing line. Long Term Goal 2 Progress: Progressing toward goal Long Term Goal 3: pt will improve right grip strenght be atleast 20# for improved ability to go back to work. Long Term Goal 3 Progress: Progressing toward goal Long Term Goal 4: Pt will improve right pinch strength by atleast 6# for improved ability to engage in work related tasks. Long Term Goal 4 Progress: Progressing toward goal  Problem List Patient Active Problem List   Diagnosis Date Noted  . Cerebrovascular accident (stroke) 12/14/2013  . Other and unspecified hyperlipidemia 12/14/2013  . Muscle weakness (generalized) 12/08/2013  . Decreased range of motion of upper extremity 12/08/2013  . 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 Essential hypertension, benign 12/01/2012  . Hypogonadism male 12/01/2012    End of Session Activity Tolerance: Patient tolerated treatment well General Behavior During Therapy: [facility] for tasks assessed/performed      Limmie Patricia, OTR/L,CBIS   12/29/2013, 9:26 AM

## 2013-12-30 ENCOUNTER — Inpatient Hospital Stay (HOSPITAL_COMMUNITY)
Admission: RE | Admit: 2013-12-30 | Discharge: 2013-12-30 | Disposition: A | Discharge status: Home / Self Care | Payer: Managed Care, Other (non HMO) | Source: Ambulatory Visit | Attending: Family Medicine | Admitting: Family Medicine

## 2013-12-30 ENCOUNTER — Ambulatory Visit (HOSPITAL_COMMUNITY)
Admission: RE | Admit: 2013-12-30 | Discharge: 2013-12-30 | Disposition: A | Discharge status: Home / Self Care | Payer: Managed Care, Other (non HMO) | Source: Ambulatory Visit | Attending: Family Medicine | Admitting: Family Medicine

## 2013-12-30 DIAGNOSIS — M6281 Muscle weakness (generalized): Secondary | ICD-10-CM

## 2013-12-30 DIAGNOSIS — IMO0002 Reserved for concepts with insufficient information to code with codable children: Secondary | ICD-10-CM

## 2013-12-30 NOTE — Evaluation (Signed)
Physical Therapy Re-Assessment  Patient Details  Name: Craig Robertson MRN: [id number] Date of Birth: 05-20-1966  Today's Date: 12/30/2013 Time:  1600-  4401     Charges: TherEx [id number]             Visit#: 10 of 24  Re-eval: 01/29/14 Assessment Diagnosis: Rt sided LE weakness SP CVA.  Next MD Visit: Neurologist 01/26/2014,Lukin- 02/09/2014 blood work  Authorization: Christella Scheuermann    Authorization Time Period:    Authorization Visit#: 69 of 24   Past Medical History:  Past Medical History  Diagnosis Date  . Hypertension   . ED (erectile dysfunction)   . Hyperlipidemia   . Low testosterone    Past Surgical History: No past surgical history on file.  Subjective Pain Assessment Currently in Pain?: No/denies  Precautions/Restrictions     Balance Screening    Prior Functioning     Cognition/Observation    Sensation/Coordination/Flexibility/Functional Tests Functional Tests Functional Tests: Gait normalized  Assessment RLE AROM (degrees) RLE Overall AROM Comments: WFL, though overall myucle mobility slightly limited  RLE Strength Right Hip Flexion:  (4+/5 was 4/5) Right Hip Extension:  (4-/5 was 3+/5) Right Hip ABduction: 4/5 Right Hip ADduction: 4/5 Right Knee Flexion: 4/5 (4/5 was 3/5) Right Knee Extension: 5/5 (was 4/5) Right Ankle Dorsiflexion: 5/5 (was 4/5)  Exercise/Treatments Plyometrics Other Plyometric Exercises: Agility ladder drills 16minutes Standing Lateral Step Up: Limitations Lateral Step Up Limitations: 14" box 10x Forward Step Up: Limitations Forward Step Up Limitations: 14" box 10x Functional Squat: Limitations Functional Squat Limitations: squat reach matrix with 5lb 5x.  Stairs: 3 flights, skipping a step reciprocal pattern SLS: Single leg balance reach matrix common 5x.   Physical Therapy Assessment and Plan PT Assessment and Plan Clinical Impression Statement: Patient is making good progress towards all goals, though he requires  additional strengthening to to return to prior level of function. Patient responded well this session to dynamic strengthieng and agitlity drill s with no pain though patient demosntrated decreased coordination with Lt vs Rt LE. and decreased single leg balance on Lt LE vs Rt LE.  patient will benefit from additional skilled phsycial therapy to return to prior level of function and further improve balance and agility.  PT Plan: Continue to progress single leg loading, depth of squatting and agility with therapy 2x a week for 4 more weeks     Goals PT Short Term Goals PT Short Term Goal 1: Patient Will demsontrate improved Rt knee extension and  hip extenisno strength to 4+/5 so patient can  perform sit to stand from chair withotu UE support PT Short Term Goal 1 - Progress: Met PT Short Term Goal 2: Patient will demsontrate improved Rt hamstring strength to 4/5 so patient can control descent when walkign down a hill PT Short Term Goal 2 - Progress: Met PT Short Term Goal 3: Patietn will demsontrated improved hip abduction strength to 4/5 so patient can evenely weight shift during gait PT Short Term Goal 3 - Progress: Met PT Long Term Goals PT Long Term Goal 1: Patient Will demsontrate improved Rt knee extension and  hip extenisno strength to 5/5 so patient can ambulate up a flight of stairs withotu UE support PT Long Term Goal 1 - Progress: Progressing toward goal PT Long Term Goal 2: Patient will demsontrate improved Rt hamstring strength to 5/5 so patient can control descent when walkign down stairs PT Long Term Goal 2 - Progress: Progressing toward goal Long Term Goal 3: Patient will demsontrate  improved hip abduction strength to 5/5 so patient can perform single leg stand bilaterally for greater than 10 seconds Long Term Goal 3 Progress: Progressing toward goal  Problem List Patient Active Problem List   Diagnosis Date Noted  . Cerebrovascular accident (stroke) 12/14/2013  . Other and  unspecified hyperlipidemia 12/14/2013  . Muscle weakness (generalized) 12/08/2013  . Decreased range of motion of upper extremity 12/08/2013  . 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 Essential hypertension, benign 12/01/2012  . Hypogonadism male 12/01/2012       GP    Kita Neace R 12/30/2013, 6:13 PM  Physician Documentation Your signature is required to indicate approval of the treatment plan as stated above.  Please sign and either send electronically or make a copy of this report for your files and return this physician signed original.   Please mark one 1.__approve of plan  2. ___approve of plan with the following conditions.   ______________________________                                                          _____________________ Physician Signature                                                                                                             Date

## 2013-12-30 NOTE — Progress Notes (Signed)
Occupational Therapy Treatment Patient Details  Name: Craig Robertson MRN: [id number] Date of Birth: 1965-12-29  Today's Date: 12/30/2013 Time: 1510-1550 OT Time Calculation (min): 40 min Therex 1510-1550 40'  Visit#: 7 of 16  Re-eval: 01/05/14    Authorization: Rosann Auerbach Managed  Authorization Time Period:    Authorization Visit#: 6 of    Subjective Symptoms/Limitations Symptoms: S: I'm a little sore from yesterday.  Pain Assessment Currently in Pain?: No/denies  Precautions/Restrictions  Precautions Precautions: None  Exercise/Treatments Seated Protraction: Strengthening;15 reps Protraction Weight (lbs): 3 Horizontal ABduction: Strengthening;15 reps Horizontal ABduction Weight (lbs): 3 Flexion: Strengthening;15 reps Flexion Weight (lbs): 3 Abduction: Strengthening;15 reps ABduction Weight (lbs): 3 Other Seated Exercises: Core strengthening exercises: sit ups; V-up sit ups; russian twist; 10X Other Seated Exercises: shoulder press; 3#; 15X ROM / Strengthening / Isometric Strengthening Cybex Press: 3 plate;20 reps Cybex Row: 3 plate;20 reps Plank: 30 seconds;1 rep (on elbows) Proximal Shoulder Strengthening, Seated: 15X 3# Other ROM/Strengthening Exercises: Company secretary bike; level 1; 10'           Occupational Therapy Assessment and Plan OT Assessment and Plan Clinical Impression Statement: A: patient completed shoulder stabilizing exercises and core strengthening exercises. Some fatigue noted and patient performed well. Some difficulty with v-up sit ups due to decreased core strength.  OT Plan: P: Grip strengthening.    Goals Short Term Goals Time to Complete Short Term Goals: 4 weeks Short Term Goal 1: Pt will be educated on HEP Short Term Goal 2: Pt will improved RUE AROM to [facility] for increased ability to work towards casting a fishing line Short Term Goal 3: Pt will improve right grip strength by atleast 10# for improved ability to go back to work. Short  Term Goal 4: Pt will engage in meal prep activity with less than 2 cues for safety. Short Term Goal 5: Pt will improve right pinch strength by atleast 3# for improved ability to go back to work. Long Term Goals Time to Complete Long Term Goals: 8 weeks Long Term Goal 1: Pt will achieve highest level of ADL, IADL, work, and leisure functioning using his RUE. Long Term Goal 2: Pt will improve RUE AROM to [facility] for improved ability to cast a fishing line. Long Term Goal 3: pt will improve right grip strenght be atleast 20# for improved ability to go back to work. Long Term Goal 4: Pt will improve right pinch strength by atleast 6# for improved ability to engage in work related tasks.  Problem List Patient Active Problem List   Diagnosis Date Noted  . Cerebrovascular accident (stroke) 12/14/2013  . Other and unspecified hyperlipidemia 12/14/2013  . Muscle weakness (generalized) 12/08/2013  . Decreased range of motion of upper extremity 12/08/2013  . 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 Essential hypertension, benign 12/01/2012  . Hypogonadism male 12/01/2012    End of Session Activity Tolerance: Patient tolerated treatment well General Behavior During Therapy: [facility] for tasks assessed/performed   Limmie Patricia, OTR/L,CBIS   12/30/2013, 3:53 PM

## 2014-01-01 ENCOUNTER — Ambulatory Visit (HOSPITAL_COMMUNITY): Payer: Managed Care, Other (non HMO) | Admitting: Physical Therapy

## 2014-01-01 ENCOUNTER — Ambulatory Visit (HOSPITAL_COMMUNITY)
Admission: RE | Admit: 2014-01-01 | Discharge: 2014-01-01 | Disposition: A | Discharge status: Home / Self Care | Payer: Managed Care, Other (non HMO) | Source: Ambulatory Visit | Attending: Family Medicine | Admitting: Family Medicine

## 2014-01-01 NOTE — Progress Notes (Signed)
Physical Therapy Treatment Patient Details  Name: Epifania GoreSteven D Makepeace MRN: [id number] Date of Birth: 1965/12/04  Today's Date: 01/01/2014 Time: 1300-1345 PT Time Calculation (min): 45 min    Charges: therEx 1300-1345 Visit#: 11 of 24  Re-eval: 01/29/14 Assessment Diagnosis: Rt sided LE weakness SP CVA.  Next MD Visit: Neurologist 01/26/2014,Lukin- 02/09/2014 blood work  Authorization: [employer]  Authorization Time Period:    Authorization Visit#: 11 of 24   Subjective: Symptoms/Limitations Symptoms: Patient sates he's feeling good today jsut a little stiff Pain Assessment Currently in Pain?: No/denies Exercise/Treatments Stretches Active Hamstring Stretch: Limitations Active Hamstring Stretch Limitations: standing with 14" step 10xx 3" 3 way with lateral hamstring focus.   Gastroc Stretch: Limitations Gastroc Stretch Limitations: slant board 10x 3second hold 3 way Plyometrics Other Plyometric Exercises: Agility ladder drills 10minutes Standing Forward Lunges Limitations: Lunge matrix common on floor 5x.  Lateral Step Up: Limitations Lateral Step Up Limitations: 14" box 10x Forward Step Up: Limitations Forward Step Up Limitations: 14" box 10x Functional Squat: Limitations Functional Squat Limitations: squat reach matrix with 5lb 5x.  Rocker Board: 2 minutes;Limitations Rocker Board Limitations: right/left ant/post SLS: Single leg balance reach matrix common 5x.    Physical Therapy Assessment and Plan PT Assessment and Plan Clinical Impression Statement: patient displays improved coordianation and agility with quicker performance of agility ladder drills this session. Patient demonstrates difficulty with transfverse plance activities deu to limited core control and limited coordination bewteen performance of Rt and Lt sided activities. Patient is quickly increasing stregth and will contineu to benefit from strength  training to return to prior level of function PT Plan: Continue to  progress single leg loading, depth of squatting and agility     Goals    Problem List Patient Active Problem List   Diagnosis Date Noted  . Cerebrovascular accident (stroke) 12/14/2013  . Other and unspecified hyperlipidemia 12/14/2013  . Muscle weakness (generalized) 12/08/2013  . Decreased range of motion of upper extremity 12/08/2013  . 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 Essential hypertension, benign 12/01/2012  . Hypogonadism male 12/01/2012    PT - End of Session Activity Tolerance: Patient tolerated treatment well General Behavior During Therapy: [facility] assessed/per[facility]  GP    Thierno Hun R 01/01/2014, 2:10 PM

## 2014-01-05 ENCOUNTER — Ambulatory Visit (HOSPITAL_COMMUNITY)
Admission: RE | Admit: 2014-01-05 | Discharge: 2014-01-05 | Disposition: A | Discharge status: Home / Self Care | Payer: Managed Care, Other (non HMO) | Source: Ambulatory Visit | Attending: Family Medicine | Admitting: Family Medicine

## 2014-01-05 DIAGNOSIS — M6281 Muscle weakness (generalized): Secondary | ICD-10-CM

## 2014-01-05 DIAGNOSIS — IMO0002 Reserved for concepts with insufficient information to code with codable children: Secondary | ICD-10-CM

## 2014-01-05 NOTE — Progress Notes (Signed)
Physical Therapy Treatment Patient Details  Name: Craig Robertson MRN: [id number] Date of Birth: Nov 28, 1965  Today's Date: 01/05/2014 Time: 1345-1430 PT Time Calculation (min): 45 min Charge;TE 1345-1430  Visit#: 12 of 24  Re-eval: 01/29/14 Assessment Diagnosis: Rt sided LE weakness SP CVA.  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   Special Tests: FOTO score: 55/100 Pain Assessment Currently in Pain?: Yes Pain Score: 5  Pain Location: Knee Pain Orientation: Right Pain Type: Acute pain  Precautions/Restrictions  Precautions Precautions: None  Exercise/Treatments Stretches Active Hamstring Stretch: Limitations Active Hamstring Stretch Limitations: standing with 14" step 10xx 3" 3 way with lateral hamstring focus.   Gastroc Stretch: Limitations Gastroc Stretch Limitations: slant board 10x 3second hold 3 way Plyometrics Other Plyometric Exercises: Agility ladder drills 2RT each 15 minutes Standing Forward Lunges Limitations: anterior/lateral lunge walking 2RT Functional Squat: Limitations Functional Squat Limitations: squat reach matrix with 5lb 5x.  SLS: Single leg balance reach matrix common 5x.     Physical Therapy Assessment and Plan PT Assessment and Plan Clinical Impression Statement: Progressed to plyometrics with agility ladder to improve coordination and sequencing with quick agility.  Pt demonstrates difficulty with transverse plance single leg stance activities due to weak core strength and coordination.  Continued with squat reach matrix wtih ability to get lower today for maximum gluteal strengthening.   PT Plan: Continue to progress single leg loading, depth of squatting and agility     Goals PT  Short Term Goals PT Short Term Goal 1: Patient Will demsontrate improved Rt knee extension and  hip extenisno strength to 4+/5 so patient can  perform sit to stand from chair withotu UE support PT Short Term Goal 2: Patient will demsontrate improved Rt hamstring strength to 4/5 so patient can control descent when walkign down a hill PT Short Term Goal 3: Patietn will demsontrated improved hip abduction strength to 4/5 so patient can evenely weight shift during gait PT Long Term Goals PT Long Term Goal 1: Patient Will demsontrate improved Rt knee extension and  hip extenisno strength to 5/5 so patient can ambulate up a flight of stairs withotu UE support PT Long Term Goal 1 - Progress: Progressing toward goal PT Long Term Goal 2: Patient will demsontrate improved Rt hamstring strength to 5/5 so patient can control descent when walkign down stairs PT Long Term Goal 2 - Progress: Progressing toward goal Long Term Goal 3: Patient will demsontrate improved hip abduction strength to 5/5 so patient can perform single leg stand bilaterally for greater than 10 seconds Long Term Goal 3 Progress: Progressing toward goal  Problem List Patient Active Problem List   Diagnosis Date Noted  . Cerebrovascular accident (stroke) 12/14/2013  . Other and unspecified hyperlipidemia 12/14/2013  . Muscle weakness (generalized) 12/08/2013  . Decreased range of motion of upper extremity 12/08/2013  . 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 Essential hypertension, benign 12/01/2012  . Hypogonadism male 12/01/2012    PT - End of Session Activity Tolerance: Patient tolerated treatment well;Patient limited by fatigue General Behavior During Therapy: [facility] for tasks assessed/performed  GP    Juel BurrowCockerham, Casey Jo 01/05/2014, 4:30 PM

## 2014-01-05 NOTE — Evaluation (Addendum)
Occupational Therapy Reassessment and Discharge  Patient Details  Name: Craig Robertson MRN: [id number] Date of Birth: 1966-06-25  Today's Date: 01/05/2014 Time: 0503-9971 OT Time Calculation (min): 45 min Reassess 1300-1325 25' Therex 3734-5634 20'  Visit#: 8 of 16  Re-eval:    Assessment Diagnosis: CVA    Past Medical History:  Past Medical History  Diagnosis Date  . Hypertension   . ED (erectile dysfunction)   . Hyperlipidemia   . Low testosterone    Past Surgical History: No past surgical history on file.  Subjective Symptoms/Limitations Symptoms: S: My arm has definitely bounced back.  Special Tests: FOTO score: 55/100 Pain Assessment Currently in Pain?: Yes Pain Score: 5  Pain Location: Knee Pain Orientation: Right Pain Type: Acute pain  Precautions/Restrictions  Precautions Precautions: None  Assessment Sensation/Coordination/Edema Coordination 9 Hole Peg Test: Right: 18.9"  (on eval: 22.5")  Additional Assessments RUE AROM (degrees) RUE Overall AROM Comments: Assess in sitting Right Shoulder Flexion: 155 Degrees (on eval: 123) Right Shoulder ABduction: 145 Degrees (on eval: 91) Right Wrist Extension: 60 Degrees (on eval: 36) RUE Strength RUE Overall Strength Comments: Assess in sitting Right Elbow Flexion: 5/5 (on eval: 4-/5) Right Elbow Extension: 5/5 (on eval: 4/5) Grip (lbs): 160 (on eval; 89) Lateral Pinch: 35 lbs (on eval: 15) 3 Point Pinch: 31 lbs (on eval: 12) Right Hand Strength - Pinch (lbs) Lateral Pinch: 35 lbs (on eval: 15) 3 Point Pinch: 31 lbs (on eval: 12)   Exercise/Treatments Seated Protraction: Theraband (25X) Theraband Level (Shoulder Protraction): Level 4 (Blue) Horizontal ABduction: Theraband (25X) Theraband Level (Shoulder Horizontal ABduction): Level 4 (Blue) External Rotation: Theraband (25X) Theraband Level (Shoulder External Rotation): Level 4 (Blue) Internal Rotation: Theraband (25X) Theraband Level  (Shoulder Internal Rotation): Level 4 (Blue) Flexion: Theraband (25X) Theraband Level (Shoulder Flexion): Level 4 (Blue)       Occupational Therapy Assessment and Plan OT Assessment and Plan Clinical Impression Statement: A: reassessment and discharged completed this date. Patient has met all STG and LTGs. Patient was given blue theraband HEP. OT Plan: P: D/C from therapy   Goals Short Term Goals Time to Complete Short Term Goals: 4 weeks Short Term Goal 1: Pt will be educated on HEP Short Term Goal 1 Progress: Met Short Term Goal 2: Pt will improved RUE AROM to [facility] for increased ability to work towards casting a fishing line Short Term Goal 2 Progress: Met Short Term Goal 3: Pt will improve right grip strength by atleast 10# for improved ability to go back to work. Short Term Goal 3 Progress: Met Short Term Goal 4: Pt will engage in meal prep activity with less than 2 cues for safety. Short Term Goal 4 Progress: Met Short Term Goal 5: Pt will improve right pinch strength by atleast 3# for improved ability to go back to work. Short Term Goal 5 Progress: Met Long Term Goals Time to Complete Long Term Goals: 8 weeks Long Term Goal 1: Pt will achieve highest level of ADL, IADL, work, and leisure functioning using his RUE. Long Term Goal 1 Progress: Met Long Term Goal 2: Pt will improve RUE AROM to [facility] for improved ability to cast a fishing line. Long Term Goal 2 Progress: Met Long Term Goal 3: pt will improve right grip strenght be atleast 20# for improved ability to go back to work. Long Term Goal 3 Progress: Met Long Term Goal 4: Pt will improve right pinch strength by atleast 6# for improved ability to engage in work  related tasks. Long Term Goal 4 Progress: Met  Problem List Patient Active Problem List   Diagnosis Date Noted  . Cerebrovascular accident (stroke) 12/14/2013  . Other and unspecified hyperlipidemia 12/14/2013  . Muscle weakness (generalized) 12/08/2013  .  Decreased range of motion of upper extremity 12/08/2013  . 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 Essential hypertension, benign 12/01/2012  . Hypogonadism male 12/01/2012    End of Session Activity Tolerance: Patient tolerated treatment well General Behavior During Therapy: [facility] for tasks assessed/performed OT Plan of Care OT Home Exercise Plan: Blue theraband exercises OT Patient Instructions: handout (scanned) Consulted and Agree with Plan of Care: Patient   Ailene Ravel, OTR/L,CBIS   01/05/2014, 2:46 PM  Physician Documentation Your signature is required to indicate approval of the treatment plan as stated above.  Please sign and either send electronically or make a copy of this report for your files and return this physician signed original.  Please mark one 1.__approve of plan  2. ___approve of plan with the following conditions.   ______________________________                                                          _____________________ Physician Signature                                                                                                             Date

## 2014-01-06 ENCOUNTER — Ambulatory Visit (HOSPITAL_COMMUNITY)
Admission: RE | Admit: 2014-01-06 | Discharge: 2014-01-06 | Disposition: A | Discharge status: Home / Self Care | Payer: Managed Care, Other (non HMO) | Source: Ambulatory Visit | Attending: Family Medicine | Admitting: Family Medicine

## 2014-01-06 NOTE — Progress Notes (Signed)
Physical Therapy Treatment Patient Details  Name: Craig Robertson MRN: [id number] Date of Birth: 1966-05-29  Today's Date: 01/06/2014 Time: 8295-62130845-0935 PT Time Calculation (min): 50 min Charge: TE [id number]  Visit#: 13 of 24  Re-eval: 01/29/14 Assessment Diagnosis: Rt sided LE weakness SP CVA.  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   Gastroc Stretch: Limitations Gastroc Stretch Limitations: slant board 10x 3second hold 3 way Aerobic Stationary Bike: Nustep 10 minutes, hill level 4, resistance 3 LE only for strengthening and activity tolerance Plyometrics Other Plyometric Exercises: Agility ladder drills 2RT each 15 minutes Standing Functional Squat: Limitations Functional Squat Limitations: squat reach matrix with 5lb 10x.  SLS: Single leg balance reach matrix common 5x.  Other Standing Knee Exercises: 90/90 squats on BOSU both sides up 10x iin frontal and transverse plans    Physical Therapy Assessment and Plan PT Assessment and Plan Clinical Impression Statement: Pt improving stability on dynamic surfaces, completed squats at 90/90 degrees on BOSU for gluteal strengthening with good weight distribution noted.  Continued with agility ladder plyometric and coordination/sequencing activaity to improve agility.  Pt ontinues to demonstrate increased difficulty with transverse plane activities due to weak core strength and coordination.   PT Plan: Continue to progress single leg loading,  depth of squatting and agility     Goals PT Short Term Goals PT Short Term Goal 1: Patient Will demsontrate improved Rt knee extension and  hip extenisno strength to 4+/5 so patient can  perform sit to stand from chair withotu UE support PT Short Term Goal 2: Patient will demsontrate improved Rt hamstring strength to 4/5 so patient can control descent when walkign down a hill PT Short Term Goal 3: Patietn will demsontrated improved hip abduction strength to 4/5 so patient can evenely weight shift during gait PT Long Term Goals PT Long Term Goal 1: Patient Will demsontrate improved Rt knee extension and  hip extenisno strength to 5/5 so patient can ambulate up a flight of stairs withotu UE support PT Long Term Goal 1 - Progress: Progressing toward goal PT Long Term Goal 2: Patient will demsontrate improved Rt hamstring strength to 5/5 so patient can control descent when walkign down stairs PT Long Term Goal 2 - Progress: Progressing toward goal Long Term Goal 3: Patient will demsontrate improved hip abduction strength to 5/5 so patient can perform single leg stand bilaterally for greater than 10 seconds Long Term Goal 3 Progress: Progressing toward goal  Problem List Patient Active Problem List   Diagnosis Date Noted  . Cerebrovascular accident (stroke) 12/14/2013  . Other and unspecified hyperlipidemia 12/14/2013  . Muscle weakness (generalized) 12/08/2013  . Decreased range of motion of upper extremity 12/08/2013  . 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 Essential hypertension, benign 12/01/2012  . Hypogonadism male 12/01/2012    PT - End of Session Activity Tolerance: Patient tolerated treatment well General Behavior During Therapy: [facility] for tasks assessed/performed  GP    Juel BurrowCockerham, Casey Jo 01/06/2014, 9:29 AM

## 2014-01-11 ENCOUNTER — Ambulatory Visit (HOSPITAL_COMMUNITY)
Admission: RE | Admit: 2014-01-11 | Discharge: 2014-01-11 | Disposition: A | Discharge status: Home / Self Care | Payer: Managed Care, Other (non HMO) | Source: Ambulatory Visit | Attending: Family Medicine | Admitting: Family Medicine

## 2014-01-11 NOTE — Progress Notes (Signed)
Physical Therapy Treatment Patient Details  Name: Craig GoreSteven D Zani MRN: [id number] Date of Birth: 08-03-1965  Today's Date: 01/11/2014 Time: 1101-1148 PT Time Calculation (min): 47 min Charge:  There ex 1101-1148 Visit#: 14 of 25  Re-eval: 01/29/14    Authorization: [employer]   Subjective: Symptoms/Limitations Symptoms: I just need to get more stamina I get tired real easy.  My balance is off a little as well    Exercise/Treatments        Aerobic Stationary Bike: Nustep 10 minutes, hill level 6, resistance 3 LE only for strengthening and activity tolerance   Plyometrics Other Plyometric Exercises: Agility ladder drills 2RT each 15 minutes Standing Forward Lunges Limitations: onto bosu ball x 10 Functional Squat Limitations: squat reach matrix with 5lb 10x on wobble discs. Lunge Walking - Round Trips: on balance beam 1 RT Other Standing Knee Exercises: on balance beam warriot I, II and III x 2 @ from 45 sec to one minute each;  Other Standing Knee Exercises: Number 1-15 on wobble disc  Quadriped:  Opposite arm/leg raise x 10    Physical Therapy Assessment and Plan PT Assessment and Plan Clinical Impression Statement: added yoga poses on balance beam to work on both strength and balance; added wobble disc to squats; Pt appears to be close to prior functional level spoke to patient about joining a gym to continue with active lifestyle for good health. PT Plan: Reassess for possible early discharge    Goals  progressing  Problem List Patient Active Problem List   Diagnosis Date Noted  . Cerebrovascular accident (stroke) 12/14/2013  . Other and unspecified hyperlipidemia 12/14/2013  . Muscle weakness (generalized) 12/08/2013  . Decreased range of motion of upper extremity 12/08/2013  . 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 Essential hypertension, benign 12/01/2012  . Hypogonadism male 12/01/2012       GP    RUSSELL,CINDY 01/11/2014, 1:18 PM

## 2014-01-13 ENCOUNTER — Ambulatory Visit (HOSPITAL_COMMUNITY)
Admission: RE | Admit: 2014-01-13 | Discharge: 2014-01-13 | Disposition: A | Discharge status: Home / Self Care | Payer: Managed Care, Other (non HMO) | Source: Ambulatory Visit

## 2014-01-13 NOTE — Progress Notes (Signed)
Physical Therapy Treatment Patient Details  Name: Craig GoreSteven D Robertson MRN: [id number] Date of Birth: Nov 28, 1965  Today's Date: 01/13/2014 Time: [id number] PT Time Calculation (min): 50 min Charge: TE [id number]  Visit#: 15 of 25  Re-eval: 01/29/14 Assessment Diagnosis: Rt sided LE weakness SP CVA.  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  C/o headaches left frontal area  Pain Assessment Currently in Pain?: Yes Pain Score: 5  Pain Location: Head Pain Orientation: Anterior;Left;Lateral  Precautions/Restrictions  Precautions Precautions: None  Exercise/Treatments Aerobic Stationary Bike: Nustep 5 minutes, hill level 6, resistance 3 LE only for strengthening and activity tolerance Standing Forward Lunges Limitations: onto bosu ball x 20 with Bil UE shoulder flexion with 5# Functional Squat: Limitations Functional Squat Limitations: squat reach matrix with 5lb 10x on wobble discs. Lunge Walking - Round Trips: on balance beam 2 RT Other Standing Knee Exercises: on balance beam warriot I, II and III x 2 @ from 45 sec to one minute each;  Other Standing Knee Exercises: Fitter 20x    Physical Therapy Assessment and Plan PT Assessment and Plan Clinical Impression Statement: Added fitter to improve core and gluteal strengthening and stabilty with increased difficutly going to Lt due to weakness. Continued with yoga poses on dynamic surfaces to improve strength, balance and overall stabilty. PT Plan: Reassess next session for possible early discharge    Goals PT Long Term Goals PT Long Term Goal 1: Patient Will demsontrate improved Rt knee extension and  hip extenisno strength to 5/5 so patient can ambulate up a flight of stairs withotu UE  support PT Long Term Goal 1 - Progress: Progressing toward goal PT Long Term Goal 2: Patient will demsontrate improved Rt hamstring strength to 5/5 so patient can control descent when walkign down stairs PT Long Term Goal 2 - Progress: Progressing toward goal Long Term Goal 3: Patient will demsontrate improved hip abduction strength to 5/5 so patient can perform single leg stand bilaterally for greater than 10 seconds Long Term Goal 3 Progress: Progressing toward goal  Problem List Patient Active Problem List   Diagnosis Date Noted  . Cerebrovascular accident (stroke) 12/14/2013  . Other and unspecified hyperlipidemia 12/14/2013  . Muscle weakness (generalized) 12/08/2013  . Decreased range of motion of upper extremity 12/08/2013  . 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 Essential hypertension, benign 12/01/2012  . Hypogonadism male 12/01/2012    PT - End of Session Activity Tolerance: Patient tolerated treatment well General Behavior During Therapy: [facility] for tasks assessed/performed  GP    Craig Robertson, Craig Robertson 01/13/2014, 9:47 AM

## 2014-01-15 ENCOUNTER — Ambulatory Visit (HOSPITAL_COMMUNITY)
Admission: RE | Admit: 2014-01-15 | Discharge: 2014-01-15 | Disposition: A | Discharge status: Home / Self Care | Payer: Managed Care, Other (non HMO) | Source: Ambulatory Visit | Attending: Family Medicine | Admitting: Family Medicine

## 2014-01-15 NOTE — Progress Notes (Signed)
Physical Therapy Treatment Patient Details  Name: Craig Robertson MRN: [id number] Date of Birth: Oct 11, 1965  Today's Date: 01/15/2014 Time: 0404-823-3938 PT Time Calculation (min): 40 min    Charges: [phone] therEx Visit#: 16 of 25  Re-eval: 01/29/14 Assessment Diagnosis: Rt sided LE weakness SP CVA with Rt knee pain.  Next MD Visit: Neurologist 01/26/2014,Lukin- 02/09/2014 blood work  Authorization: [employer]  Authorization Visit#: 16 of 24   Subjective: Symptoms/Limitations Symptoms: patient states he has been doing his stretches and HEP, pain only with deep squatting and lunging  in Left knee,  Pain Assessment Currently in Pain?: Yes Pain Score: 2  Pain Location: Head  Precautions/Restrictions  Precautions Precautions: None  Exercise/Treatments Stretches Active Hamstring Stretch: Limitations Active Hamstring Stretch Limitations: standing with 14" step 10xx 3" 3 way with lateral hamstring focus.   Plyometrics Other Plyometric Exercises: Agility ladder drills 2RT each 10 minutes Standing Forward Lunges Limitations: 8" retro half kneel 3way hip drives with overhead reaches 5x Functional Squat: Limitations Functional Squat Limitations: squat reach matrix with 5lb 10x on wobble discs.  8" single leg squat 3way LE reach jump matrix 5x each  Physical Therapy Assessment and Plan PT Assessment and Plan Clinical Impression Statement: Progressed single leg loading and dynamic loadign this session with patient noting Rt knee pain during squatting. Durign squattign patient displays quadraceps dominance with loading. When cued to load through gluts patient notes no knee pain and displays improved depth of loadign. Patient demosntrated a good response with single leg squating with UE support to promote glut loading and also demosntrated good loading through jump matrix though exercise is not ready to be prgoressed due to the lack of depth with whcih the patient can control. patient also noted  further relief of pain with half knee hip drives as it promoted increased rectus femoris mobility.     PT Plan: Contineu to progress Rt LE stability and promote glut loadign via single leg squatting. Nest session progress single leg balance reach matrix to increased height to progress depth of loading.     Goals PT Long Term Goals PT Long Term Goal 1: Patient Will demsontrate improved Rt knee extension and  hip extenison strength to 5/5 so patient can ambulate up a flight of stairs withotu UE support PT Long Term Goal 1 - Progress: Progressing toward goal PT Long Term Goal 2: Patient will demsontrate improved Rt hamstring strength to 5/5 so patient can control descent when walkign down stairs PT Long Term Goal 2 - Progress: Progressing toward goal Long Term Goal 3: Patient will demsontrate improved hip abduction strength to 5/5 so patient can perform single leg stand bilaterally for greater than 10 seconds Long Term Goal 3 Progress: Progressing toward goal  Problem List Patient Active Problem List   Diagnosis Date Noted  . Cerebrovascular accident (stroke) 12/14/2013  . Other and unspecified hyperlipidemia 12/14/2013  . Muscle weakness (generalized) 12/08/2013  . Decreased range of motion of upper extremity 12/08/2013  . 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 Essential hypertension, benign 12/01/2012  . Hypogonadism male 12/01/2012    PT - End of Session Activity Tolerance: Patient tolerated treatment well General Behavior During Therapy: [facility] for tasks assessed/performed PT Plan of Care PT Home Exercise Plan: 3DE hip Excursions, and Squats with UE support, Frontal plane squat walk with yellow ball and blue theraband 1430ft. Single leg balance reach matrix  GP    DeWitt, Cash R 01/15/2014, 10:22 AM

## 2014-01-18 ENCOUNTER — Ambulatory Visit (HOSPITAL_COMMUNITY)
Admission: RE | Admit: 2014-01-18 | Discharge: 2014-01-18 | Disposition: A | Discharge status: Home / Self Care | Payer: Managed Care, Other (non HMO) | Source: Ambulatory Visit | Attending: Family Medicine | Admitting: Family Medicine

## 2014-01-18 NOTE — Progress Notes (Signed)
Physical Therapy Re-evaluation/Treatment note/ Discharge summary  Patient Details  Name: Craig Robertson MRN: [id number] Date of Birth: 1966/05/23  Today's Date: 01/18/2014 Time: 9169-4503 PT Time Calculation (min): 44 min Charge: MMT 8882-8003. TE 4917-9150               Visit#: 17 of 25  Re-eval: 01/29/14 Assessment Diagnosis: Rt sided LE weakness SP CVA with Rt knee pain.  Next MD Visit: Neurologist 01/26/2014,Lukin- 02/09/2014 blood work  Authorization: [employer]    Authorization Time Period:    Authorization Visit#: 17 of 24   Subjective Symptoms/Limitations Symptoms: Pt reports he feels like he might be ready for discharge, stated he feels 95% improved.  Only c/o with headaches. How long can you sit comfortably?: Unlimited How long can you stand comfortably?: 30 minutes How long can you walk comfortably?: Able to walk comfortably for hours Special Tests: FOTO score: 97% was 55% Pain Assessment Currently in Pain?: No/denies  Precautions/Restrictions  Precautions Precautions: None  Assessment RLE Strength Right Hip Flexion: 5/5 (was 4/5) Right Hip Extension: 5/5 (was 3+/5) Right Hip ABduction: 5/5 (was 4/5) Right Hip ADduction: 5/5 (was 4/5) Right Knee Flexion: 5/5 (was 4/5) Right Knee Extension: 5/5 Right Ankle Dorsiflexion: 5/5 Right Ankle Plantar Flexion:  (4+/5)  Exercise/Treatments Aerobic Elliptical: 5' L2 Machines for Strengthening Cybex Knee Extension: 4 Pl 2 sets x 10 reps each LE  Cybex Knee Flexion: 9.5 Pl Bil LE 15x Cybex Leg Press: 12Pl 20x Standing Stairs: 4Flights recirprocal pattern no HR SLS: 60"+ Bil LE Other Standing Knee Exercises: 8" single leg squat 3way LE reach 10 reps    Physical Therapy Assessment and Plan PT Assessment and Plan Clinical Impression Statement: Reassessment complete following report from pt. at prior level of function. Findings included increased tolerance with standing and walking. Increased stregth to WNL for all  LE. Improved balance with ability to SLS 60"+ with no LOB episodes. Pt with improved perceived functional abilty with FOTO score at 97% status,, 3% limitation. Pt educated on benefits of joining [employer] for maximum benefits follwing discharge. PT Plan: D/C to HEP per all goals met.    Goals Home Exercise Program PT Goal: Perform Home Exercise Program - Progress: Met PT Short Term Goals PT Short Term Goal 1: Patient Will demsontrate improved Rt knee extension and  hip extenisno strength to 4+/5 so patient can  perform sit to stand from chair withotu UE support PT Short Term Goal 1 - Progress: Met PT Short Term Goal 2: Patient will demsontrate improved Rt hamstring strength to 4/5 so patient can control descent when walkign down a hill PT Short Term Goal 2 - Progress: Met PT Short Term Goal 3: Patietn will demsontrated improved hip abduction strength to 4/5 so patient can evenely weight shift during gait PT Short Term Goal 3 - Progress: Met PT Long Term Goals PT Long Term Goal 1: Patient Will demsontrate improved Rt knee extension and  hip extenison strength to 5/5 so patient can ambulate up a flight of stairs withotu UE support PT Long Term Goal 1 - Progress: Met PT Long Term Goal 2: Patient will demsontrate improved Rt hamstring strength to 5/5 so patient can control descent when walkign down stairs PT Long Term Goal 2 - Progress: Met Long Term Goal 3: Patient will demsontrate improved hip abduction strength to 5/5 so patient can perform single leg stand bilaterally for greater than 10 seconds Long Term Goal 3 Progress: Met  Problem List Patient Active Problem List  Diagnosis Date Noted  . Cerebrovascular accident (stroke) 12/14/2013  . Other and unspecified hyperlipidemia 12/14/2013  . Muscle weakness (generalized) 12/08/2013  . Decreased range of motion of upper extremity 12/08/2013  . 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 Essential hypertension, benign 12/01/2012  .  Hypogonadism male 12/01/2012    PT - End of Session Activity Tolerance: Patient tolerated treatment well General Behavior During Therapy: [facility] for tasks assessed/performed PT Plan of Care PT Home Exercise Plan: [employer] membership paperwork, discussion on current HEP to continue at home.  GP    Aldona Lento 01/18/2014, 4:31 PM  Physician Documentation Your signature is required to indicate approval of the treatment plan as stated above.  Please sign and either send electronically or make a copy of this report for your files and return this physician signed original.   Please mark one 1.__approve of plan  2. ___approve of plan with the following conditions.   ______________________________                                                          _____________________ Physician Signature                                                                                                             Date

## 2014-01-20 ENCOUNTER — Ambulatory Visit (HOSPITAL_COMMUNITY): Payer: Managed Care, Other (non HMO) | Admitting: Physical Therapy

## 2014-01-27 NOTE — Progress Notes (Signed)
Physical Therapy Re-evaluation/Treatment note/ Discharge summary  Patient Details  Name: Craig Robertson MRN: [id number] Date of Birth: 1966/05/23  Today's Date: 01/18/2014 Time: 9169-4503 PT Time Calculation (min): 44 min Charge: MMT 8882-8003. TE 4917-9150               Visit#: 17 of 25  Re-eval: 01/29/14 Assessment Diagnosis: Rt sided LE weakness SP CVA with Rt knee pain.  Next MD Visit: Neurologist 01/26/2014,Lukin- 02/09/2014 blood work  Authorization: [employer]    Authorization Time Period:    Authorization Visit#: 17 of 24   Subjective Symptoms/Limitations Symptoms: Pt reports he feels like he might be ready for discharge, stated he feels 95% improved.  Only c/o with headaches. How long can you sit comfortably?: Unlimited How long can you stand comfortably?: 30 minutes How long can you walk comfortably?: Able to walk comfortably for hours Special Tests: FOTO score: 97% was 55% Pain Assessment Currently in Pain?: No/denies  Precautions/Restrictions  Precautions Precautions: None  Assessment RLE Strength Right Hip Flexion: 5/5 (was 4/5) Right Hip Extension: 5/5 (was 3+/5) Right Hip ABduction: 5/5 (was 4/5) Right Hip ADduction: 5/5 (was 4/5) Right Knee Flexion: 5/5 (was 4/5) Right Knee Extension: 5/5 Right Ankle Dorsiflexion: 5/5 Right Ankle Plantar Flexion:  (4+/5)  Exercise/Treatments Aerobic Elliptical: 5' L2 Machines for Strengthening Cybex Knee Extension: 4 Pl 2 sets x 10 reps each LE  Cybex Knee Flexion: 9.5 Pl Bil LE 15x Cybex Leg Press: 12Pl 20x Standing Stairs: 4Flights recirprocal pattern no HR SLS: 60"+ Bil LE Other Standing Knee Exercises: 8" single leg squat 3way LE reach 10 reps    Physical Therapy Assessment and Plan PT Assessment and Plan Clinical Impression Statement: Reassessment complete following report from pt. at prior level of function. Findings included increased tolerance with standing and walking. Increased stregth to WNL for all  LE. Improved balance with ability to SLS 60"+ with no LOB episodes. Pt with improved perceived functional abilty with FOTO score at 97% status,, 3% limitation. Pt educated on benefits of joining [employer] for maximum benefits follwing discharge. PT Plan: D/C to HEP per all goals met.    Goals Home Exercise Program PT Goal: Perform Home Exercise Program - Progress: Met PT Short Term Goals PT Short Term Goal 1: Patient Will demsontrate improved Rt knee extension and  hip extenisno strength to 4+/5 so patient can  perform sit to stand from chair withotu UE support PT Short Term Goal 1 - Progress: Met PT Short Term Goal 2: Patient will demsontrate improved Rt hamstring strength to 4/5 so patient can control descent when walkign down a hill PT Short Term Goal 2 - Progress: Met PT Short Term Goal 3: Patietn will demsontrated improved hip abduction strength to 4/5 so patient can evenely weight shift during gait PT Short Term Goal 3 - Progress: Met PT Long Term Goals PT Long Term Goal 1: Patient Will demsontrate improved Rt knee extension and  hip extenison strength to 5/5 so patient can ambulate up a flight of stairs withotu UE support PT Long Term Goal 1 - Progress: Met PT Long Term Goal 2: Patient will demsontrate improved Rt hamstring strength to 5/5 so patient can control descent when walkign down stairs PT Long Term Goal 2 - Progress: Met Long Term Goal 3: Patient will demsontrate improved hip abduction strength to 5/5 so patient can perform single leg stand bilaterally for greater than 10 seconds Long Term Goal 3 Progress: Met  Problem List Patient Active Problem List  Diagnosis Date Noted  . Cerebrovascular accident (stroke) 12/14/2013  . Other and unspecified hyperlipidemia 12/14/2013  . Muscle weakness (generalized) 12/08/2013  . Decreased range of motion of upper extremity 12/08/2013  . 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 Essential hypertension, benign 12/01/2012  .  Hypogonadism male 12/01/2012    PT - End of Session Activity Tolerance: Patient tolerated treatment well General Behavior During Therapy: [facility] for tasks assessed/performed PT Plan of Care PT Home Exercise Plan: [employer] membership paperwork, discussion on current HEP to continue at home.  GP    Aldona Lento 01/18/2014, 4:31 PM  Syniah Berne 01/27/2014 9:08AM  Physician Documentation Your signature is required to indicate approval of the treatment plan as stated above.  Please sign and either send electronically or make a copy of this report for your files and return this physician signed original.   Please mark one 1.__approve of plan  2. ___approve of plan with the following conditions.   ______________________________                                                          _____________________ Physician Signature                                                                                                             Date

## 2014-02-01 DIAGNOSIS — Z0289 Encounter for other administrative examinations: Secondary | ICD-10-CM

## 2014-02-12 LAB — HEPATIC FUNCTION PANEL
ALK PHOS: 51 U/L (ref 39–117)
ALT: 31 U/L (ref 0–53)
AST: 31 U/L (ref 0–37)
Albumin: 4.2 g/dL (ref 3.5–5.2)
BILIRUBIN INDIRECT: 0.3 mg/dL (ref 0.2–1.2)
Bilirubin, Direct: 0.1 mg/dL (ref 0.0–0.3)
TOTAL PROTEIN: 7.3 g/dL (ref 6.0–8.3)
Total Bilirubin: 0.4 mg/dL (ref 0.2–1.2)

## 2014-02-12 LAB — LIPID PANEL
CHOLESTEROL: 116 mg/dL (ref 0–200)
HDL: 50 mg/dL (ref >39)
LDL Cholesterol: 57 mg/dL (ref 0–99)
TRIGLYCERIDES: 46 mg/dL (ref <150)
Total CHOL/HDL Ratio: 2.3 Ratio
VLDL: 9 mg/dL (ref 0–40)

## 2014-02-22 ENCOUNTER — Encounter: Payer: Self-pay | Admitting: Family Medicine

## 2014-02-24 ENCOUNTER — Telehealth: Payer: Self-pay | Admitting: Family Medicine

## 2014-02-24 ENCOUNTER — Other Ambulatory Visit: Payer: Self-pay | Admitting: *Deleted

## 2014-02-24 MED ORDER — VERAPAMIL HCL ER 180 MG PO TBCR: 180.0000 mg | EXTENDED_RELEASE_TABLET | Freq: Two times a day (BID) | ORAL | Status: DC

## 2014-02-24 NOTE — Telephone Encounter (Signed)
verapamil (COVERA HS) 180 MG (CO) 24 hr tablet  Pt states [facility] [place] sent this request last week  i see nothing here indicating that  Please fill as soon as possible.   Last seen 12/09/13 this script was supposed to be changed to  180 mg PO BID Tablet since his stroke they changed it at [facility] As well as the office note from 5/20 indicates there was supposed  To be a change in therapy

## 2014-02-24 NOTE — Telephone Encounter (Signed)
Ok 180 bid 6 mos worth

## 2014-02-24 NOTE — Telephone Encounter (Signed)
Left message on voicemail notifying patient that [facility] [place]

## 2014-02-24 NOTE — Telephone Encounter (Signed)
Please advise. [facility] changed this to BID, our med list states one daily.

## 2014-03-25 ENCOUNTER — Other Ambulatory Visit: Payer: Self-pay | Admitting: Family Medicine

## 2014-03-31 ENCOUNTER — Other Ambulatory Visit: Payer: Self-pay | Admitting: Family Medicine

## 2014-04-09 ENCOUNTER — Encounter: Payer: Self-pay | Admitting: Family Medicine

## 2014-04-09 ENCOUNTER — Ambulatory Visit (INDEPENDENT_AMBULATORY_CARE_PROVIDER_SITE_OTHER): Payer: Managed Care, Other (non HMO) | Admitting: Family Medicine

## 2014-04-09 VITALS — BP 140/90 | Ht 71.5 in | Wt 252.4 lb

## 2014-04-09 DIAGNOSIS — E785 Hyperlipidemia, unspecified: Secondary | ICD-10-CM

## 2014-04-09 DIAGNOSIS — I10 Essential (primary) hypertension: Secondary | ICD-10-CM

## 2014-04-09 DIAGNOSIS — I639 Cerebral infarction, unspecified: Secondary | ICD-10-CM

## 2014-04-09 DIAGNOSIS — I6350 Cerebral infarction due to unspecified occlusion or stenosis of unspecified cerebral artery: Secondary | ICD-10-CM

## 2014-04-09 MED ORDER — ATORVASTATIN CALCIUM 40 MG PO TABS: 40.0000 mg | ORAL_TABLET | Freq: Every day | ORAL | Status: DC

## 2014-04-09 MED ORDER — VERAPAMIL HCL ER 180 MG PO CP24: ORAL_CAPSULE | ORAL | Status: DC

## 2014-04-09 MED ORDER — ENALAPRIL MALEATE 10 MG PO TABS: ORAL_TABLET | ORAL | Status: DC

## 2014-04-09 NOTE — Progress Notes (Signed)
   Subjective:    Patient ID: Craig Robertson, male    DOB: 1965/08/25, 48 y.o.   MRN: [id number]  Hypertension This is a chronic problem. The current episode started more than 1 year ago. The problem has been gradually improving since onset. The problem is controlled. There are no associated agents to hypertension. There are no known risk factors for coronary artery disease. Treatments tried: enalapril, verapamil. The current treatment provides significant improvement. There are no compliance problems.    Patient states he is no sure if he is suppose to be taking his verapamil once or twice daily. Reports he is been taking it twice per day.  Compliant with her lipid medication. No obvious side effects. Trying to watch diet more closely. Next  Has gone back to work full-time. Feels that he is pretty much recovered from the stroke. Still a bit of unsteadiness at times.  Patient states he has no other concerns at this time.          Review of Systems No chest pain no headache no back pain no abdominal pain no change in bowel habits no blood in stool ROS otherwise negative    Objective:   Physical Exam Alert no apparent distress. Lungs clear. Heart rate rhythm. H&T normal. Neuro exam strength symmetric. Gait normal. Fine coordination appears completely normal.       Assessment & Plan:  Impression stroke fortunately very good recovery at this point. #2 hypertension good control. #3 hyperlipidemia results discussed from prior. Plan diet exercise discussed. Maintain same medications. Followup as scheduled. WSL

## 2014-07-13 ENCOUNTER — Encounter: Payer: Self-pay | Admitting: Family Medicine

## 2014-07-13 ENCOUNTER — Ambulatory Visit (INDEPENDENT_AMBULATORY_CARE_PROVIDER_SITE_OTHER): Payer: Managed Care, Other (non HMO) | Admitting: Family Medicine

## 2014-07-13 VITALS — BP 130/90 | Temp 98.7°F | Ht 71.5 in | Wt 250.2 lb

## 2014-07-13 DIAGNOSIS — J329 Chronic sinusitis, unspecified: Secondary | ICD-10-CM

## 2014-07-13 DIAGNOSIS — J310 Chronic rhinitis: Secondary | ICD-10-CM

## 2014-07-13 MED ORDER — AMOXICILLIN-POT CLAVULANATE 875-125 MG PO TABS: 1.0000 | ORAL_TABLET | Freq: Two times a day (BID) | ORAL | Status: AC

## 2014-07-13 NOTE — Patient Instructions (Signed)
Three aleave twice per [place]

## 2014-07-13 NOTE — Progress Notes (Signed)
   Subjective:    Patient ID: Epifania GoreSteven D Tandon, male    DOB: [birth year], 48 y.o.   MRN: [id number]  Cough This is a new problem. The current episode started 1 to 4 weeks ago. The problem has been unchanged. The cough is non-productive. Associated symptoms include a fever, headaches and myalgias. Nothing aggravates the symptoms. Treatments tried: Dayquil. The treatment provided no relief.   Patient states he has no other concerns at this time.   Dry cough worse at night  Several wks duration  staeted a sa viral infxn  Low g fever  Dim enrgy level  Review of Systems  Constitutional: Positive for fever.  Respiratory: Positive for cough.   Musculoskeletal: Positive for myalgias.  Neurological: Positive for headaches.       Objective:   Physical Exam  Alert moderate malaise. H&T moderate his congestion frontal tenderness. Pharynx normal neck supple. Bronchial cough heart regular in rhythm.      Assessment & Plan:  Impression post viral rhinosinusitis plan antibiotics prescribed. Symptomatic care discussed. Warning signs discussed. WSL

## 2014-07-14 ENCOUNTER — Encounter: Payer: Self-pay | Admitting: Family Medicine

## 2014-09-22 ENCOUNTER — Other Ambulatory Visit: Payer: Self-pay | Admitting: Family Medicine

## 2014-09-28 ENCOUNTER — Telehealth: Payer: Self-pay | Admitting: Family Medicine

## 2014-09-28 NOTE — Telephone Encounter (Signed)
Pt states the only thing he has done was walk all day Sunday but  His arch on left foot is swollen, painful (can't touch or walk hardly) Throbbing really bad. Wants to know what to do about it, does not  Feel it is broken in any way just not sure what's wrong   [facility] [place] [address]

## 2014-09-29 ENCOUNTER — Ambulatory Visit (INDEPENDENT_AMBULATORY_CARE_PROVIDER_SITE_OTHER): Payer: Managed Care, Other (non HMO) | Admitting: Family Medicine

## 2014-09-29 ENCOUNTER — Encounter: Payer: Self-pay | Admitting: Family Medicine

## 2014-09-29 VITALS — BP 142/90 | Temp 98.9°F | Ht 71.5 in

## 2014-09-29 DIAGNOSIS — B999 Unspecified infectious disease: Secondary | ICD-10-CM | POA: Diagnosis not present

## 2014-09-29 DIAGNOSIS — L03116 Cellulitis of left lower limb: Secondary | ICD-10-CM | POA: Diagnosis not present

## 2014-09-29 MED ORDER — CEFTRIAXONE SODIUM 1 G IJ SOLR
500.0000 mg | Freq: Once | INTRAMUSCULAR | Status: AC
  Administered 2014-09-29: 500 mg via INTRAMUSCULAR

## 2014-09-29 MED ORDER — ONDANSETRON 4 MG PO TBDP: 4.0000 mg | ORAL_TABLET | Freq: Four times a day (QID) | ORAL | Status: DC | PRN

## 2014-09-29 MED ORDER — DOXYCYCLINE HYCLATE 100 MG PO TABS: 100.0000 mg | ORAL_TABLET | Freq: Two times a day (BID) | ORAL | Status: DC

## 2014-09-29 NOTE — Progress Notes (Signed)
   Subjective:    Patient ID: Craig Robertson, male    DOB: 1966-04-17, 49 y.o.   MRN: [id number]  Foot Pain This is a new problem. Episode onset: 3 days ago. Associated symptoms include headaches and nausea. Treatments tried: elpsom salt, ice and heat, tylenol. The treatment provided no relief.   Nausea, headache and diarrhea started Saturday.   The patient did a lot of walking prior to foot discomfort. Possible low-grade fever and chills.  Review of Systems  Gastrointestinal: Positive for nausea.  Neurological: Positive for headaches.       Objective:   Physical Exam   Alert mild malaise. Vitals stable. Lungs clear. Heart rare rhythm. Left midfoot discrete very erythematous patch extremely tender with leading edge.     Assessment & Plan:  Impression cellulitis discussed with patient plan Rocephin. Doxy 100 twice a day 10 days. Symptomatic care discussed. WSL

## 2014-09-29 NOTE — Patient Instructions (Signed)
This is called cellulitis,  Can occur at our age spontaneously in the legs or feet  Very important to take all antibiotics

## 2014-09-29 NOTE — Telephone Encounter (Signed)
appt scheduled today 

## 2014-09-29 NOTE — Telephone Encounter (Signed)
?????   Ntsw, if pt desires needs to be seen to evaluate, rx etc

## 2014-10-08 ENCOUNTER — Telehealth: Payer: Self-pay | Admitting: Family Medicine

## 2014-10-08 MED ORDER — ATORVASTATIN CALCIUM 40 MG PO TABS: 40.0000 mg | ORAL_TABLET | Freq: Every day | ORAL | Status: DC

## 2014-10-08 NOTE — Telephone Encounter (Signed)
Notified patient that med was sent to pharmacy.  

## 2014-10-08 NOTE — Telephone Encounter (Signed)
Pt is needing a refill on his cholesterol medicine. Pt has an appt for 10/18/14. Pt took last One today.

## 2014-10-09 ENCOUNTER — Telehealth: Payer: Self-pay | Admitting: Family Medicine

## 2014-10-09 NOTE — Telephone Encounter (Signed)
Pt's CIALIS 5 MG tablet prior auth APPROVED from 10/08/14-10/08/15, faxed approval [facility]

## 2014-10-14 ENCOUNTER — Ambulatory Visit (INDEPENDENT_AMBULATORY_CARE_PROVIDER_SITE_OTHER): Payer: Managed Care, Other (non HMO) | Admitting: Family Medicine

## 2014-10-14 ENCOUNTER — Encounter: Payer: Self-pay | Admitting: Family Medicine

## 2014-10-14 VITALS — BP 136/84 | Ht 71.5 in | Wt 251.0 lb

## 2014-10-14 DIAGNOSIS — E785 Hyperlipidemia, unspecified: Secondary | ICD-10-CM

## 2014-10-14 DIAGNOSIS — Z125 Encounter for screening for malignant neoplasm of prostate: Secondary | ICD-10-CM | POA: Diagnosis not present

## 2014-10-14 DIAGNOSIS — I10 Essential (primary) hypertension: Secondary | ICD-10-CM

## 2014-10-14 DIAGNOSIS — Z79899 Other long term (current) drug therapy: Secondary | ICD-10-CM

## 2014-10-14 MED ORDER — ENALAPRIL MALEATE 10 MG PO TABS: ORAL_TABLET | ORAL | Status: DC

## 2014-10-14 MED ORDER — ATORVASTATIN CALCIUM 40 MG PO TABS: 40.0000 mg | ORAL_TABLET | Freq: Every day | ORAL | Status: DC

## 2014-10-14 MED ORDER — VERAPAMIL HCL ER 180 MG PO CP24
ORAL_CAPSULE | ORAL | Status: DC
Start: 2014-10-14 — End: 2015-03-17

## 2014-10-14 NOTE — Progress Notes (Signed)
   Subjective:    Patient ID: Craig Robertson, male    DOB: 1966/07/18, 49 y.o.   MRN: [id number]  HPI Patient is here today for a check up.  He needs a refill on his meds.  Would like to discuss allergy meds. Pollen bothering pt a lot, using allergy meds and benadry.  bp med faithful. Has not missed any doses. No obvious side effects. Watching salt intake.  Compliant with lipid meds. No obvious side effects. Watching diet closely.  Exercising not regulalry   History of stroke. No symptoms of TIA. No recent stroke.   Review of Systems No headache no chest pain no back pain no abdominal pain no change in bowel habits alert vital stable    Objective:   Physical Exam  Alert vital stable blood pressure good on repeat HEENT normal. Lungs clear. Heart regular in rhythm. Neuro exam intact. Ankles without edema.  Mild nasal congestion    Assessment & Plan:  Impression 1 hypertension good control #2 hyperlipidemia status uncertain #3 status post stroke stable #4 allergic rhinitis discussed plan over-the-counter Claritin. Appropriate blood work. Diet exercise discussed. Medications refilled. Check in 6 months. WSL

## 2014-10-16 LAB — HEPATIC FUNCTION PANEL
ALBUMIN: 4.1 g/dL (ref 3.5–5.5)
ALK PHOS: 61 IU/L (ref 39–117)
ALT: 44 IU/L (ref 0–44)
AST: 35 IU/L (ref 0–40)
BILIRUBIN TOTAL: 0.6 mg/dL (ref 0.0–1.2)
Bilirubin, Direct: 0.16 mg/dL (ref 0.00–0.40)
TOTAL PROTEIN: 7 g/dL (ref 6.0–8.5)

## 2014-10-16 LAB — BASIC METABOLIC PANEL
BUN/Creatinine Ratio: 17 (ref 9–20)
BUN: 15 mg/dL (ref 6–24)
CO2: 25 mmol/L (ref 18–29)
CREATININE: 0.87 mg/dL (ref 0.76–1.27)
Calcium: 9.2 mg/dL (ref 8.7–10.2)
Chloride: 101 mmol/L (ref 97–108)
GFR, EST AFRICAN AMERICAN: 119 mL/min/{1.73_m2} (ref >59)
GFR, EST NON AFRICAN AMERICAN: 103 mL/min/{1.73_m2} (ref >59)
Glucose: 97 mg/dL (ref 65–99)
Potassium: 4.7 mmol/L (ref 3.5–5.2)
SODIUM: 140 mmol/L (ref 134–144)

## 2014-10-16 LAB — LIPID PANEL
CHOLESTEROL TOTAL: 112 mg/dL (ref 100–199)
Chol/HDL Ratio: 2.2 ratio units (ref 0.0–5.0)
HDL: 51 mg/dL (ref >39)
LDL Calculated: 50 mg/dL (ref 0–99)
TRIGLYCERIDES: 57 mg/dL (ref 0–149)
VLDL CHOLESTEROL CAL: 11 mg/dL (ref 5–40)

## 2014-10-16 LAB — PSA: PSA: 1.4 ng/mL (ref 0.0–4.0)

## 2014-10-19 ENCOUNTER — Encounter: Payer: Self-pay | Admitting: Family Medicine

## 2014-11-04 ENCOUNTER — Other Ambulatory Visit: Payer: Self-pay | Admitting: Family Medicine

## 2014-12-03 ENCOUNTER — Ambulatory Visit (HOSPITAL_COMMUNITY)
Admission: RE | Admit: 2014-12-03 | Discharge: 2014-12-03 | Disposition: A | Discharge status: Home / Self Care | Payer: Managed Care, Other (non HMO) | Source: Ambulatory Visit | Attending: Family Medicine | Admitting: Family Medicine

## 2014-12-03 ENCOUNTER — Encounter: Payer: Self-pay | Admitting: Family Medicine

## 2014-12-03 ENCOUNTER — Ambulatory Visit (INDEPENDENT_AMBULATORY_CARE_PROVIDER_SITE_OTHER): Payer: Managed Care, Other (non HMO) | Admitting: Family Medicine

## 2014-12-03 VITALS — BP 130/80 | Temp 98.0°F | Ht 71.5 in | Wt 253.4 lb

## 2014-12-03 DIAGNOSIS — M5440 Lumbago with sciatica, unspecified side: Secondary | ICD-10-CM | POA: Insufficient documentation

## 2014-12-03 MED ORDER — PREDNISONE 20 MG PO TABS: ORAL_TABLET | ORAL | Status: DC

## 2014-12-03 MED ORDER — HYDROCODONE-ACETAMINOPHEN 5-325 MG PO TABS: 1.0000 | ORAL_TABLET | Freq: Four times a day (QID) | ORAL | Status: DC | PRN

## 2014-12-03 NOTE — Progress Notes (Signed)
   Subjective:    Patient ID: Craig Robertson, male    DOB: 12/16/1965, 49 y.o.   MRN: [id number]  Back Pain This is a new problem. The current episode started 1 to 4 weeks ago. The problem occurs intermittently. The problem has been gradually worsening since onset. The pain is present in the lumbar spine. The quality of the pain is described as cramping and aching. The pain radiates to the left thigh and right thigh. The pain is moderate. The pain is the same all the time. The symptoms are aggravated by sitting. Stiffness is present all day. He has tried NSAIDs for the symptoms. The treatment provided no relief.    This pain is been going on now for several weeks. Progressive. Deep ache in the right posterior thigh radiating to the buttock and down into the calf. Occurs when up and about. Occurs rapidly when on feet.  Review of Systems  Musculoskeletal: Positive for back pain.   no change in urinary habits no numbness or tingling     Objective:   Physical Exam  Alert no acute distress. Vital stable lungs clear heart regular in rhythm right leg positive straight leg raise deep tendon reflexes intact. Low spine tenderness palpation      Assessment & Plan:  Impression sciatica discussed plan prednisone taper. Local measures discussed. Pain medicine as needed. Diet exercise discussed recheck of as noted WSL

## 2014-12-17 ENCOUNTER — Ambulatory Visit: Payer: Managed Care, Other (non HMO) | Admitting: Family Medicine

## 2014-12-31 ENCOUNTER — Encounter: Payer: Self-pay | Admitting: Family Medicine

## 2014-12-31 ENCOUNTER — Ambulatory Visit (INDEPENDENT_AMBULATORY_CARE_PROVIDER_SITE_OTHER): Payer: Managed Care, Other (non HMO) | Admitting: Family Medicine

## 2014-12-31 VITALS — BP 142/90 | Ht 71.5 in | Wt 250.5 lb

## 2014-12-31 DIAGNOSIS — M5431 Sciatica, right side: Secondary | ICD-10-CM | POA: Diagnosis not present

## 2014-12-31 MED ORDER — ALPRAZOLAM 1 MG PO TABS: ORAL_TABLET | ORAL | Status: DC

## 2014-12-31 NOTE — Progress Notes (Signed)
   Subjective:    Patient ID: Craig Robertson, male    DOB: 08-27-1965, 49 y.o.   MRN: [id number]  Back Pain This is a chronic problem. The problem occurs intermittently. The problem has been gradually worsening since onset. The pain is at a severity of 6/10. The pain is moderate. The pain is worse during the day (Pain increases in the morning and while standing.). The symptoms are aggravated by standing. Stiffness is present in the morning. Associated symptoms include leg pain. (Pain travels down right leg.) The treatment provided no relief.    Patient in today to discuss getting a possible referral to a specialist due to sciatic nerve pain. Using aleave , very painful at times,  Much improved when laying down at night   Patient states no other concerns this visit.   Has had this off-and-on the past but not anything to this serious extent. Patient in major pain during the day. Probably around making it through his day but not easily  Review of Systems  Musculoskeletal: Positive for back pain.   no change in urinary habits no change in bowel habits no chest pain no headache     Objective:   Physical Exam  Alert vitals stable moderate pain. Lungs clear. Heart rare rhythm. Right leg positive straight leg raise. Positive right sciatic notch tenderness. Weakness in dorsiflexion. Ankle reflex question diminished      Assessment & Plan:  Impression progressive severe sciatica plan time for MRI and delineation. Hydrocodone daily at bedtime when necessary. Aleve during the day. Local measures discussed. 25 minutes spent most in discussion

## 2015-01-14 ENCOUNTER — Ambulatory Visit (HOSPITAL_COMMUNITY)
Admission: RE | Admit: 2015-01-14 | Discharge: 2015-01-14 | Disposition: A | Discharge status: Home / Self Care | Payer: Managed Care, Other (non HMO) | Source: Ambulatory Visit | Attending: Family Medicine | Admitting: Family Medicine

## 2015-01-14 DIAGNOSIS — M545 Low back pain: Secondary | ICD-10-CM | POA: Diagnosis present

## 2015-01-14 DIAGNOSIS — M5126 Other intervertebral disc displacement, lumbar region: Secondary | ICD-10-CM | POA: Diagnosis not present

## 2015-01-14 DIAGNOSIS — M25551 Pain in right hip: Secondary | ICD-10-CM | POA: Diagnosis not present

## 2015-01-14 DIAGNOSIS — M25552 Pain in left hip: Secondary | ICD-10-CM | POA: Insufficient documentation

## 2015-01-17 NOTE — Addendum Note (Signed)
Addended by: Margaretha SheffieldBROWN, AUTUMN S on: 01/17/2015 09:18 AM   Modules accepted: Orders

## 2015-02-04 ENCOUNTER — Other Ambulatory Visit: Payer: Self-pay | Admitting: Family Medicine

## 2015-03-17 ENCOUNTER — Other Ambulatory Visit: Payer: Self-pay | Admitting: Family Medicine

## 2015-04-08 ENCOUNTER — Ambulatory Visit (INDEPENDENT_AMBULATORY_CARE_PROVIDER_SITE_OTHER): Payer: Managed Care, Other (non HMO) | Admitting: Family Medicine

## 2015-04-08 ENCOUNTER — Encounter: Payer: Self-pay | Admitting: Family Medicine

## 2015-04-08 VITALS — BP 136/98 | Ht 71.5 in | Wt 254.0 lb

## 2015-04-08 DIAGNOSIS — Z8673 Personal history of transient ischemic attack (TIA), and cerebral infarction without residual deficits: Secondary | ICD-10-CM

## 2015-04-08 DIAGNOSIS — N5201 Erectile dysfunction due to arterial insufficiency: Secondary | ICD-10-CM | POA: Diagnosis not present

## 2015-04-08 DIAGNOSIS — E785 Hyperlipidemia, unspecified: Secondary | ICD-10-CM | POA: Diagnosis not present

## 2015-04-08 DIAGNOSIS — Z79899 Other long term (current) drug therapy: Secondary | ICD-10-CM | POA: Diagnosis not present

## 2015-04-08 DIAGNOSIS — Z23 Encounter for immunization: Secondary | ICD-10-CM

## 2015-04-08 DIAGNOSIS — I10 Essential (primary) hypertension: Secondary | ICD-10-CM | POA: Diagnosis not present

## 2015-04-08 MED ORDER — TADALAFIL 5 MG PO TABS: 5.0000 mg | ORAL_TABLET | Freq: Every day | ORAL | Status: DC

## 2015-04-08 MED ORDER — ENALAPRIL MALEATE 10 MG PO TABS: ORAL_TABLET | ORAL | Status: DC

## 2015-04-08 MED ORDER — VERAPAMIL HCL ER 180 MG PO TBCR: EXTENDED_RELEASE_TABLET | ORAL | Status: DC

## 2015-04-08 NOTE — Progress Notes (Signed)
   Subjective:    Patient ID: Craig Robertson, male    DOB: 1965-12-21, 49 y.o.   MRN: [id number]  Hyperlipidemia This is a new problem. The current episode started more than 1 year ago. There are no compliance problems (pt does some walking, eats healthy).    Had back surgery since last visit. Pt states he is doing good.   Pt states head feels stopped up after taking cialis.   Pt wants tetanus   Patient compliant with lipid medication. Generally does not miss a dose. Has cut down fat intake.  Compliant with blood pressure medicine. Watching salt intake. Notes good compliance. No obvious side effects.  No recurrence of stroke type symptoms. History of stroke see prior notes. Compliant with low-dose aspirin.    nd flu vaccine.    Review of Systems No headache no chest pain no back pain no abdominal pain no change in bowel habits no blood in stool    Objective:   Physical Exam Alert vital stable HEENT normal no focal neurological deficits lungs clear heart rare rhythm ankles without edema       Assessment & Plan:  Impression 1 hypertension good control #2 hyperlipidemia controlled good discussed #3 status post stroke no recurrence of symptoms #4 erectile dysfunction discussed occasional mild symptomatology was Cialis but patient wishes to stay with it plan flu shot today. Appropriate blood work may change medications based this diet exercise discuss other meds refilled follow-up as scheduled WSL

## 2015-04-16 LAB — LIPID PANEL
CHOLESTEROL TOTAL: 132 mg/dL (ref 100–199)
Chol/HDL Ratio: 2.3 ratio units (ref 0.0–5.0)
HDL: 57 mg/dL (ref >39)
LDL Calculated: 62 mg/dL (ref 0–99)
TRIGLYCERIDES: 64 mg/dL (ref 0–149)
VLDL Cholesterol Cal: 13 mg/dL (ref 5–40)

## 2015-04-16 LAB — HEPATIC FUNCTION PANEL
ALBUMIN: 4.3 g/dL (ref 3.5–5.5)
ALT: 38 IU/L (ref 0–44)
AST: 35 IU/L (ref 0–40)
Alkaline Phosphatase: 64 IU/L (ref 39–117)
BILIRUBIN TOTAL: 0.7 mg/dL (ref 0.0–1.2)
BILIRUBIN, DIRECT: 0.21 mg/dL (ref 0.00–0.40)
Total Protein: 7.2 g/dL (ref 6.0–8.5)

## 2015-04-17 ENCOUNTER — Encounter: Payer: Self-pay | Admitting: Family Medicine

## 2015-04-24 DIAGNOSIS — N529 Male erectile dysfunction, unspecified: Secondary | ICD-10-CM | POA: Insufficient documentation

## 2015-05-02 ENCOUNTER — Other Ambulatory Visit: Payer: Self-pay | Admitting: Family Medicine

## 2015-05-05 ENCOUNTER — Telehealth: Payer: Self-pay | Admitting: Family Medicine

## 2015-05-05 MED ORDER — SCOPOLAMINE 1 MG/3DAYS TD PT72: 1.0000 | MEDICATED_PATCH | TRANSDERMAL | Status: DC

## 2015-05-05 NOTE — Telephone Encounter (Signed)
Scopolamine patches sent to pharmacy per protocol. Patient was notified.

## 2015-05-05 NOTE — Telephone Encounter (Signed)
Patient needs something called [facility] for motion sickness leaving going on vacation

## 2015-05-18 ENCOUNTER — Other Ambulatory Visit: Payer: Self-pay | Admitting: Family Medicine

## 2015-06-10 ENCOUNTER — Ambulatory Visit (INDEPENDENT_AMBULATORY_CARE_PROVIDER_SITE_OTHER): Payer: Managed Care, Other (non HMO) | Admitting: Family Medicine

## 2015-06-10 ENCOUNTER — Encounter: Payer: Self-pay | Admitting: Family Medicine

## 2015-06-10 VITALS — BP 130/84 | Ht 71.5 in | Wt 259.1 lb

## 2015-06-10 DIAGNOSIS — R5382 Chronic fatigue, unspecified: Secondary | ICD-10-CM | POA: Diagnosis not present

## 2015-06-10 DIAGNOSIS — I10 Essential (primary) hypertension: Secondary | ICD-10-CM | POA: Diagnosis not present

## 2015-06-10 DIAGNOSIS — R0683 Snoring: Secondary | ICD-10-CM | POA: Diagnosis not present

## 2015-06-10 NOTE — Progress Notes (Signed)
   Subjective:    Patient ID: Craig Robertson, male    DOB: 04-Sep-1965, 49 y.o.   MRN: [id number]  HPI Patient is here today for excessive snoring. Patient states that this has been going on for a while now.  Excessive sleepy when driving a major problem,  Works mostly first shift  Has to get up 3:15 most morning   Patient's wife reports excessive snoring. Spells of not breathing. Snoring definitely worsening in recent years.  . Treatments tried: none. Patient states that his wife tells him that he also is moving and jerking a lot while he is asleep.   Patient has no other concerns at this time.   Patient notes a lot of daytime fatigue.  Patient notes increased difficulty with falling asleep when driving.  Review of Systems No headache no chest pain no back pain no abdominal pain ROS otherwise negative    Objective:   Physical Exam  Alert vitals stable patient does have a large frame but is also overweight. HEENT soft palate redundant neck large. Lungs clear heart regular rate and rhythm.      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 plan 25 minutes spent most in discussion. We'll press on with sleep study. Further recommendations based results WSL

## 2015-06-13 ENCOUNTER — Telehealth: Payer: Self-pay | Admitting: Family Medicine

## 2015-06-13 NOTE — Telephone Encounter (Signed)
Pt dropped off the [employer]. Message in box.

## 2015-06-22 ENCOUNTER — Encounter: Payer: Self-pay | Admitting: Family Medicine

## 2015-06-23 ENCOUNTER — Other Ambulatory Visit (HOSPITAL_COMMUNITY): Payer: Self-pay | Admitting: Respiratory Therapy

## 2015-06-23 DIAGNOSIS — R0683 Snoring: Secondary | ICD-10-CM

## 2015-06-23 DIAGNOSIS — G4730 Sleep apnea, unspecified: Secondary | ICD-10-CM

## 2015-06-23 DIAGNOSIS — R5383 Other fatigue: Secondary | ICD-10-CM

## 2015-06-26 ENCOUNTER — Ambulatory Visit: Payer: Managed Care, Other (non HMO) | Attending: Family Medicine | Admitting: Sleep Medicine

## 2015-06-26 DIAGNOSIS — G4733 Obstructive sleep apnea (adult) (pediatric): Secondary | ICD-10-CM | POA: Insufficient documentation

## 2015-06-26 DIAGNOSIS — G4730 Sleep apnea, unspecified: Secondary | ICD-10-CM

## 2015-06-26 DIAGNOSIS — R0683 Snoring: Secondary | ICD-10-CM

## 2015-06-26 DIAGNOSIS — R5383 Other fatigue: Secondary | ICD-10-CM

## 2015-07-04 ENCOUNTER — Telehealth: Payer: Self-pay | Admitting: Family Medicine

## 2015-07-04 NOTE — Telephone Encounter (Signed)
Called patient and informed him per Dr.Steve Luking-generally takes 10-14 days for results. Feel free to call back later next week. Patient verbalized understanding.

## 2015-07-04 NOTE — Telephone Encounter (Signed)
Pt is wanting the results to his sleep study he had done on the 4th of Dec

## 2015-07-04 NOTE — Telephone Encounter (Signed)
Tell pt generally takes ten to 14 daysa sorry feel free to call bck later next wk if not available then

## 2015-07-10 NOTE — Sleep Study (Signed)
  Crestone A. Merlene Laughter, MD     [email or web address]             NOCTURNAL POLYSOMNOGRAPHY   LOCATION: [facility]  CLINICAL INFORMATION Sleep Study Type: Split Night CPAP Indication for sleep study: Fatigue, Snoring Epworth Sleepiness Score: 22 SLEEP STUDY TECHNIQUE As per the AASM Manual for the Scoring of Sleep and Associated Events v2.3 (April 2016) with a hypopnea requiring 4% desaturations. The channels recorded and monitored were frontal, central and occipital EEG, electrooculogram (EOG), submentalis EMG (chin), nasal and oral airflow, thoracic and abdominal wall motion, anterior tibialis EMG, snore microphone, electrocardiogram, and pulse oximetry. Continuous positive airway pressure (CPAP) was initiated when the patient met split night criteria and was titrated according to treat sleep-disordered breathing. MEDICATIONS Medications taken by the patient : N/A Medications administered by patient during sleep study : No sleep medicine administered. Prior to Admission medications   Medication Sig Start Date End Date Taking? Authorizing Provider  aspirin 81 MG tablet Take 81 mg by mouth daily.    Historical Provider, MD  atorvastatin (LIPITOR) 40 MG tablet TAKE 1 TABLET BY MOUTH EVERY DAY 05/18/15   Mikey Kirschner, MD  enalapril (VASOTEC) 10 MG tablet TAKE 1 TABLET BY MOUTH EVERY DAY 04/08/15   Mikey Kirschner, MD  tadalafil (CIALIS) 5 MG tablet Take 1 tablet (5 mg total) by mouth daily. 04/08/15   Mikey Kirschner, MD  verapamil (CALAN-SR) 180 MG CR tablet TAKE 1 TABLET (180 MG TOTAL) BY MOUTH 2 (TWO) TIMES DAILY. 04/08/15   Mikey Kirschner, MD    RESPIRATORY PARAMETERS Diagnostic Total AHI (/hr): 29.7 RDI (/hr): 29.7 OA Index (/hr): 5.3 CA Index (/hr): 0.0 REM AHI (/hr): 54.8 NREM AHI (/hr): 22.9 Supine AHI (/hr): 49.4 Non-supine AHI (/hr): 0.00 Min O2 Sat (%): 76.00 Mean O2 (%): 92.99 Time below 88% (min): 7.6     Titration Optimal Pressure (cm): 6 AHI at  Optimal Pressure (/hr): 1 Min O2 at Optimal Pressure (%): 92.00 Supine % at Optimal (%): N/A Sleep % at Optimal (%): N/A     SLEEP ARCHITECTURE The recording time for the entire night was 416.5 minutes. During a baseline period of 201.0 minutes, the patient slept for 192.2 minutes in REM and nonREM, yielding a sleep efficiency of 95.6%. Sleep onset after lights out was 4.8 minutes with a REM latency of 50.5 minutes. The patient spent 2.34% of the night in stage N1 sleep, 62.81% in stage N2 sleep, 13.78% in stage N3 and 21.07% in REM. During the titration period of 202.6 minutes, the patient slept for 161.5 minutes in REM and nonREM, yielding a sleep efficiency of 79.7%. Sleep onset after CPAP initiation was 14.0 minutes with a REM latency of 79.5 minutes. The patient spent 7.12% of the night in stage N1 sleep, 49.23% in stage N2 sleep, 8.05% in stage N3 and 35.60% in REM. CARDIAC DATA The 2 lead EKG demonstrated sinus rhythm. The mean heart rate was 59.64 beats per minute. Other EKG findings include: None. LEG MOVEMENT DATA The total Periodic Limb Movements of Sleep (PLMS) were 0. The PLMS index was 0.00.   IMPRESSIONS - Moderate obstructive sleep apnea occurred during the diagnostic portion of the study(AHI = 29.7/hour). An optimal CPAP pressure 6 was reached.   Delano Metz, MD Diplomate, American Board of Sleep Medicine.

## 2015-07-21 ENCOUNTER — Telehealth: Payer: Self-pay | Admitting: Family Medicine

## 2015-07-21 NOTE — Telephone Encounter (Signed)
Definite sleep apnea, CPAP titratable set at five to fifteen, rx machine and suppplies

## 2015-07-21 NOTE — Telephone Encounter (Signed)
Sleep study report in folder in your office.

## 2015-07-21 NOTE — Telephone Encounter (Signed)
Script awaiting signature. [facility] to be sent [facility].

## 2015-07-21 NOTE — Telephone Encounter (Signed)
Discussed with pt. [facility]

## 2015-07-21 NOTE — Telephone Encounter (Signed)
Pt would like the results from his sleep apnea test please

## 2015-07-21 NOTE — Telephone Encounter (Signed)
[facility] with dr Ronal Feardoonquah's office to send report asap.

## 2015-07-21 NOTE — Telephone Encounter (Signed)
Nurse contact sleep folks this morn and get report this morning, i do not need the thirty sheets, all i need is the summary sheet or two that states the overall results, pt called two wks ago and calling agin need report

## 2015-07-22 ENCOUNTER — Telehealth: Payer: Self-pay | Admitting: Family Medicine

## 2015-07-22 NOTE — Telephone Encounter (Signed)
Called pt, explained that [facility] does not participate with his insurance therefore we ordered his Auto CPAP through [facility] Pt verbalized understanding   Order was faxed with demographics, insurance card, and sleep study results  Auto CPAP, 5 - 15cmH2O, mask of choice, tubing, & heated humidifier Dx: G47.30 - sleep apnea

## 2015-09-29 ENCOUNTER — Encounter: Payer: Self-pay | Admitting: Nurse Practitioner

## 2015-09-29 ENCOUNTER — Ambulatory Visit: Payer: Managed Care, Other (non HMO) | Admitting: Nurse Practitioner

## 2015-09-29 ENCOUNTER — Ambulatory Visit (INDEPENDENT_AMBULATORY_CARE_PROVIDER_SITE_OTHER): Payer: 59 | Admitting: Nurse Practitioner

## 2015-09-29 VITALS — BP 150/98 | Temp 98.9°F | Ht 71.5 in | Wt 261.0 lb

## 2015-09-29 DIAGNOSIS — J0110 Acute frontal sinusitis, unspecified: Secondary | ICD-10-CM | POA: Diagnosis not present

## 2015-09-29 MED ORDER — AMOXICILLIN-POT CLAVULANATE 875-125 MG PO TABS: 1.0000 | ORAL_TABLET | Freq: Two times a day (BID) | ORAL | Status: DC

## 2015-09-29 MED ORDER — HYDROCODONE-HOMATROPINE 5-1.5 MG/5ML PO SYRP: 5.0000 mL | ORAL_SOLUTION | ORAL | Status: DC | PRN

## 2015-09-29 NOTE — Progress Notes (Signed)
Subjective:  Presents for complaints of an illness that began 4 days ago. Has felt very bad. Muscle aches. Extreme fatigue. Sleeping more than usual. No fever. Slight sore throat. Now having a left frontal area headache. Cough is slightly better producing yellow mucus. Slight chest pain with cough. Runny nose. No ear pain. Has not been able to wear his CPAP due to head congestion.  Objective:   BP 150/98 mmHg  Temp(Src) 98.9 F (37.2 C) (Oral)  Ht 5' 11.5" (1.816 m)  Wt 261 lb (118.389 kg)  BMI 35.90 kg/m2 NAD. Alert, oriented. TMs clear effusion, no erythema. Pharynx injected with green PND noted. Neck supple with mild soft anterior adenopathy. Lungs clear. Heart regular rate rhythm.  Assessment: Acute frontal sinusitis, recurrence not specified Most likely a post viral illness possible post influenza  Plan:  Meds ordered this encounter  Medications  . amoxicillin-clavulanate (AUGMENTIN) 875-125 MG tablet    Sig: Take 1 tablet by mouth 2 (two) times daily.    Dispense:  20 tablet    Refill:  0    Order Specific Question:  Supervising Provider    Answer:  Merlyn AlbertLUKING, WILLIAM S [[id number]]  . HYDROcodone-homatropine (HYCODAN) 5-1.5 MG/5ML syrup    Sig: Take 5 mLs by mouth every 4 (four) hours as needed.    Dispense:  120 mL    Refill:  0    Order Specific Question:  Supervising Provider    Answer:  Merlyn AlbertLUKING, WILLIAM S [[id number]]  . Cholecalciferol 2000 units CAPS    Sig: Take by mouth.  . butalbital-acetaminophen-caffeine (FIORICET WITH CODEINE) 50-325-40-30 MG capsule    Sig: Take by mouth.   Mucinex DM as directed. Warning signs reviewed. Call back in 4-5 days if no improvement, sooner if worse.

## 2015-10-10 ENCOUNTER — Other Ambulatory Visit: Payer: Self-pay | Admitting: Nurse Practitioner

## 2015-10-10 ENCOUNTER — Telehealth: Payer: Self-pay | Admitting: Nurse Practitioner

## 2015-10-10 MED ORDER — LEVOFLOXACIN 500 MG PO TABS: 500.0000 mg | ORAL_TABLET | Freq: Every day | ORAL | Status: DC

## 2015-10-10 NOTE — Telephone Encounter (Signed)
[facility] 10/10/15 

## 2015-10-10 NOTE — Telephone Encounter (Signed)
Spoke with patient to clarify symptoms.Patient stated that he is experiencing chest congestion.States no fever, no wheezing. Completed ABT yesterday.Please advise?

## 2015-10-10 NOTE — Telephone Encounter (Signed)
Sent in another course of antibiotics. Office visit if persists.

## 2015-10-10 NOTE — Telephone Encounter (Signed)
Pt called stating that he finished his antibiotics yesterday and feels like he has not gotten rid of it all. Pt wants to know if he needs to be rechecked or if something else can be called in.    [facility] [place]

## 2015-10-14 NOTE — Telephone Encounter (Signed)
Pt.notified

## 2015-10-31 ENCOUNTER — Other Ambulatory Visit: Payer: Self-pay | Admitting: Family Medicine

## 2015-10-31 NOTE — Telephone Encounter (Signed)
Patient called to check on this

## 2015-11-05 ENCOUNTER — Other Ambulatory Visit: Payer: Self-pay | Admitting: Family Medicine

## 2015-11-25 ENCOUNTER — Other Ambulatory Visit: Payer: Self-pay | Admitting: Family Medicine

## 2015-12-19 ENCOUNTER — Other Ambulatory Visit: Payer: Self-pay | Admitting: Family Medicine

## 2015-12-26 ENCOUNTER — Ambulatory Visit (HOSPITAL_COMMUNITY)
Admission: RE | Admit: 2015-12-26 | Discharge: 2015-12-26 | Disposition: A | Discharge status: Home / Self Care | Payer: 59 | Source: Ambulatory Visit | Attending: Family Medicine | Admitting: Family Medicine

## 2015-12-26 ENCOUNTER — Ambulatory Visit (INDEPENDENT_AMBULATORY_CARE_PROVIDER_SITE_OTHER): Payer: 59 | Admitting: Family Medicine

## 2015-12-26 VITALS — BP 144/98 | Temp 99.0°F | Ht 71.5 in | Wt 255.0 lb

## 2015-12-26 DIAGNOSIS — S9031XA Contusion of right foot, initial encounter: Secondary | ICD-10-CM | POA: Diagnosis not present

## 2015-12-26 DIAGNOSIS — M25571 Pain in right ankle and joints of right foot: Secondary | ICD-10-CM

## 2015-12-26 DIAGNOSIS — M7731 Calcaneal spur, right foot: Secondary | ICD-10-CM | POA: Diagnosis not present

## 2015-12-26 MED ORDER — HYDROCODONE-ACETAMINOPHEN 5-325 MG PO TABS: 1.0000 | ORAL_TABLET | Freq: Four times a day (QID) | ORAL | Status: DC | PRN
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
## 2015-12-27 ENCOUNTER — Telehealth: Payer: Self-pay | Admitting: Family Medicine

## 2015-12-27 NOTE — Telephone Encounter (Signed)
Results discussed with patient. Patient advised xray showed No acute injury on xray no fractures or dislocation. Patient verbalized understanding.

## 2015-12-27 NOTE — Telephone Encounter (Signed)
See xray 

## 2015-12-27 NOTE — Telephone Encounter (Signed)
Pt is wanting to know the results to his xray.

## 2015-12-31 ENCOUNTER — Other Ambulatory Visit: Payer: Self-pay | Admitting: Family Medicine

## 2016-01-24 ENCOUNTER — Other Ambulatory Visit: Payer: Self-pay | Admitting: Family Medicine

## 2016-02-01 ENCOUNTER — Other Ambulatory Visit: Payer: Self-pay | Admitting: Family Medicine

## 2016-02-14 IMAGING — MR MR LUMBAR SPINE W/O CM
4 of 5 series · 15 of 48 positions shown · non-contrast
Comparison: Lumbar radiographs dated 12/03/2014

CLINICAL DATA: Chronic progressive low back pain with bilateral hip
pain extending into both legs with some weakness. Persistent right
leg sciatica.

EXAM:
MRI LUMBAR SPINE WITHOUT CONTRAST
TECHNIQUE: Multiplanar, multisequence MR imaging of the lumbar spine was
performed. No intravenous contrast was administered.

[Series 3: T2 · sagittal · 4.0mm · 0.71mm/px · 6 of 17 slices shown (1 of 2)]
[im 1/17]
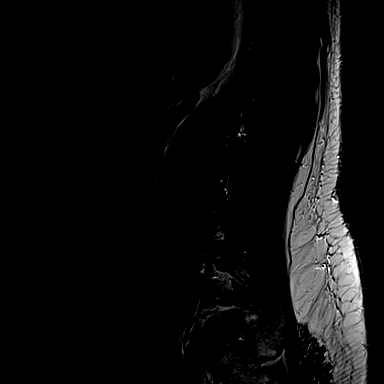
[im 4/17]
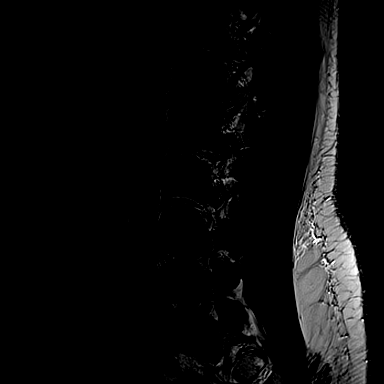
[im 7/17]
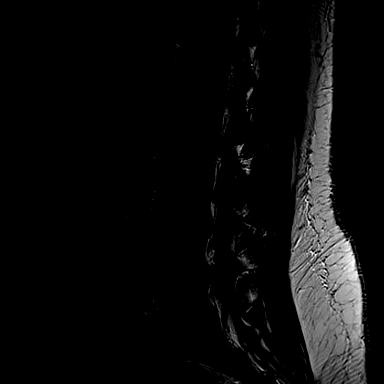
[im 10/17]
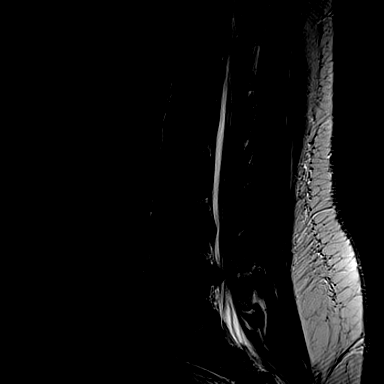
[im 13/17]
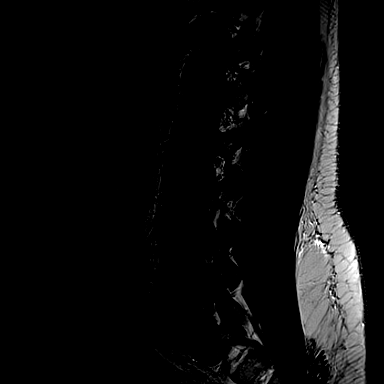
[im 17/17]
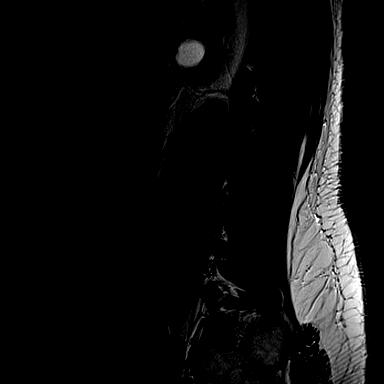

[Series 4: T1 · sagittal · 4.0mm · 0.36mm/px · 3 of 17 slices shown (1 of 2)]
[im 4/17]
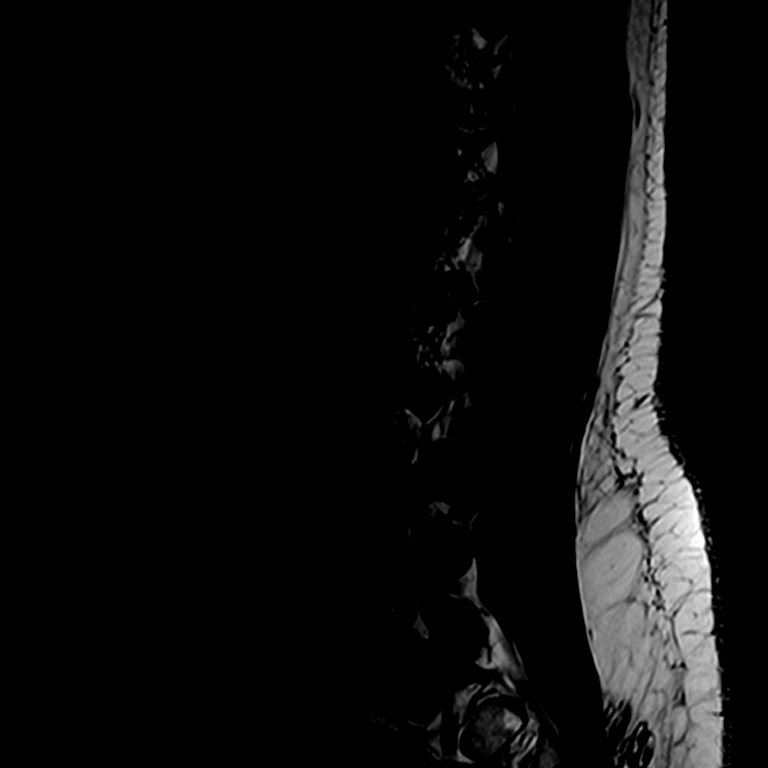
[im 10/17]
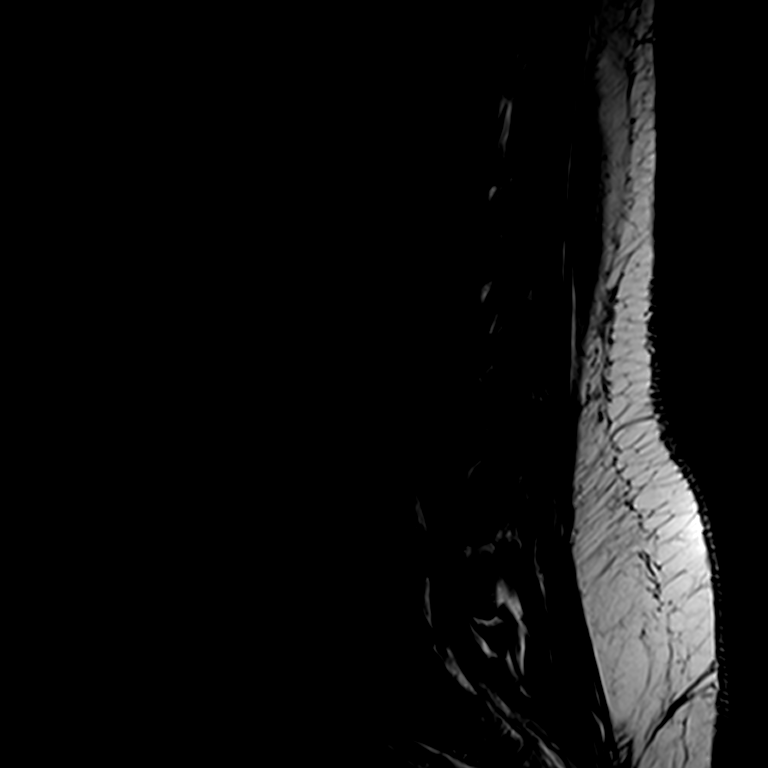
[im 17/17]
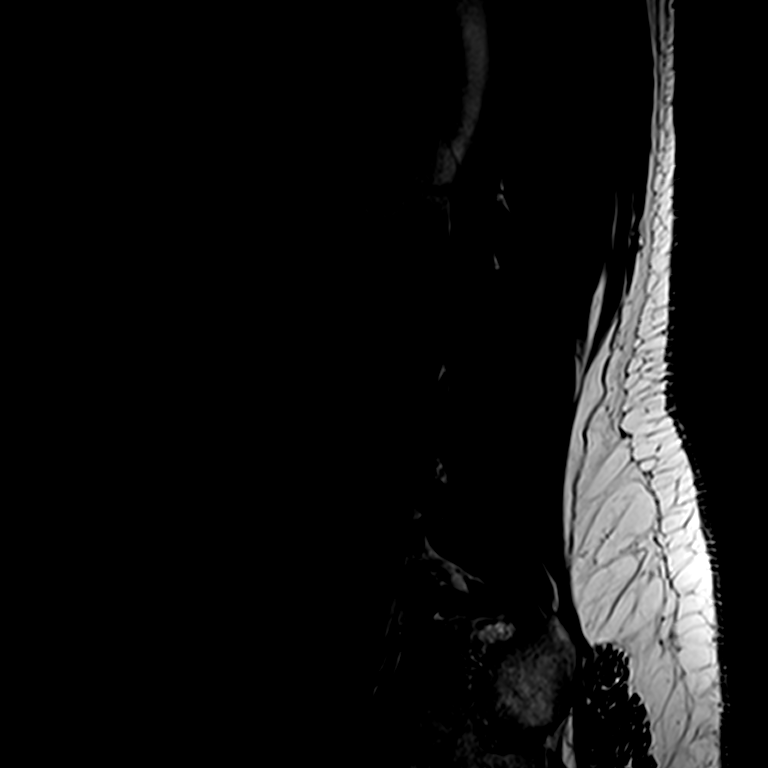

[Series 6: T2 · axial · 4.0mm · 0.23mm/px · z∈[-175,-43]mm · 3 of 39 slices shown (2 of 2)]
[im 6/39]
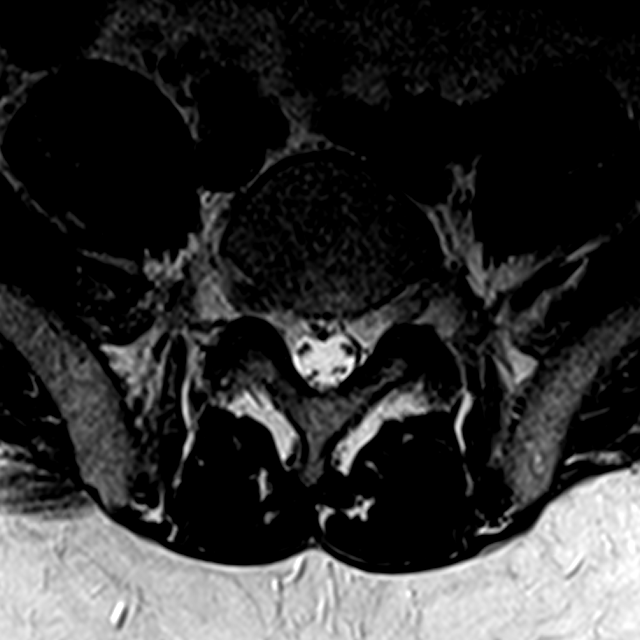
[im 20/39]
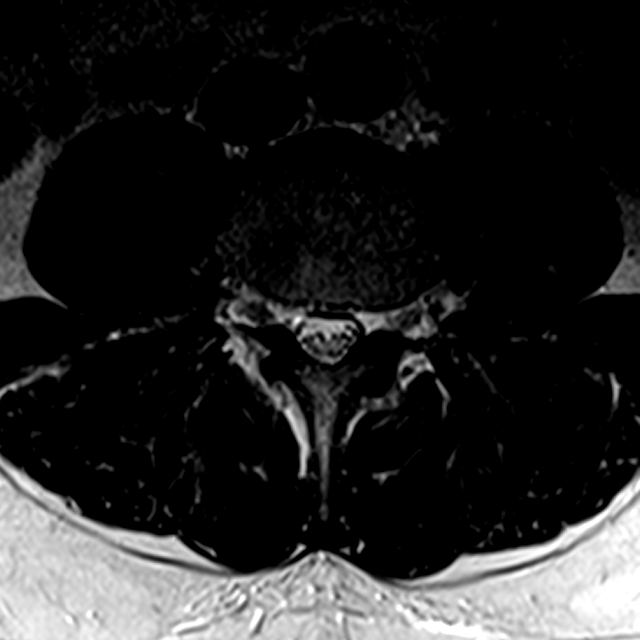
[im 33/39]
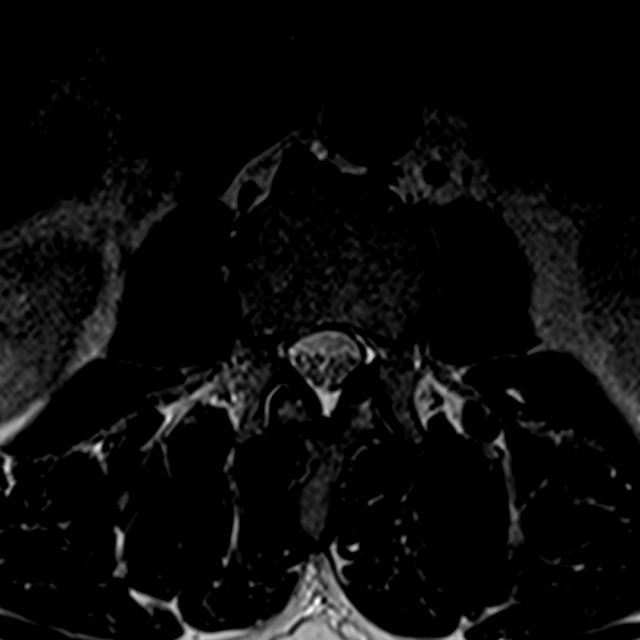

[Series 7: T1 · axial · 4.0mm · 0.22mm/px · z∈[-173,-43]mm · 3 of 39 slices shown (2 of 2)]
[im 6/39]
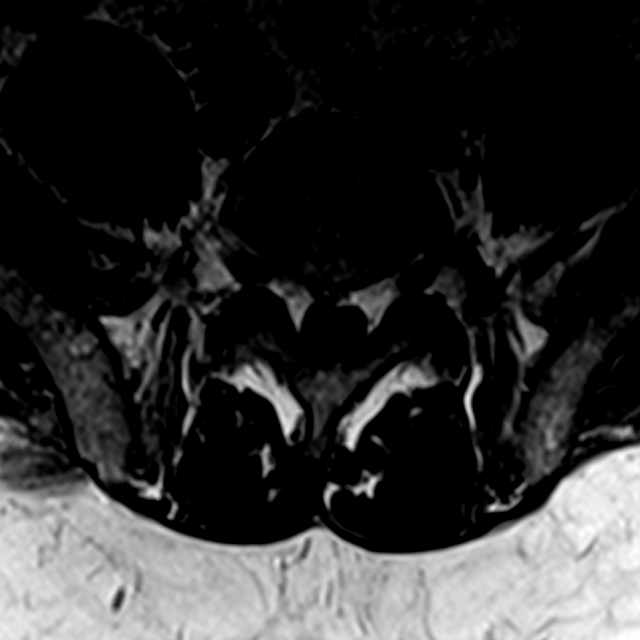
[im 20/39]
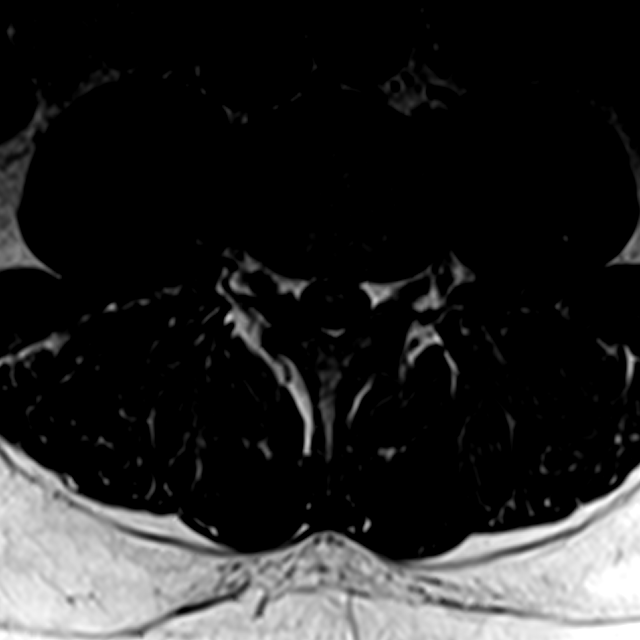
[im 33/39]
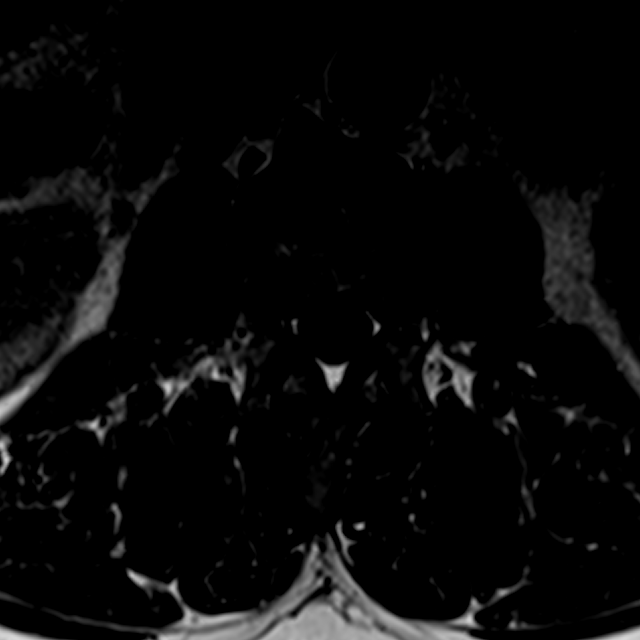

[15 of 48 positions shown; findings below may reference images not displayed]

FINDINGS: Normal conus tip at L1-2. Normal paraspinal soft tissues. 21 mm
benign appearing cyst in the lower pole the left kidney,
incompletely visualized.

T11-12 through L2-3:  Normal.

L3-4: Tiny central disc protrusion with annular fissure slightly
indenting the ventral aspect of the thecal sac without focal neural
impingement.

L4-5: Large central soft disc extrusion extending inferiorly behind
the body of L5 slightly to the right of midline. This measures 11 x
17 x 28 mm. There is marked compression of both sides of the thecal
sac. The disc protrusion extends into both neural foramina but the
L4 nerves exit without impingement.

L5-S1:  Normal.
IMPRESSION: 1. Large soft disc protrusion and extrusion at L4-5 markedly
compressing the thecal sac and extending to the right and left, but
slightly more to the right. This should affect the L5 and S1 nerves,
more on the right than the left.
2. Small central soft disc protrusion at L3-4 without focal neural
impingement.

## 2016-02-21 ENCOUNTER — Other Ambulatory Visit: Payer: Self-pay | Admitting: Family Medicine

## 2016-03-04 ENCOUNTER — Other Ambulatory Visit: Payer: Self-pay | Admitting: Family Medicine

## 2016-03-05 NOTE — Telephone Encounter (Signed)
30 d worth needs six mo f u

## 2016-03-07 ENCOUNTER — Other Ambulatory Visit: Payer: Self-pay | Admitting: Family Medicine

## 2016-03-08 ENCOUNTER — Telehealth: Payer: Self-pay | Admitting: Family Medicine

## 2016-03-08 NOTE — Telephone Encounter (Signed)
Pt is needing a refill on his atorvastatin (LIPITOR) 40 MG tablet.    [facility] [place]

## 2016-03-08 NOTE — Telephone Encounter (Signed)
Spoke with patient and informed him that refill on Atorvastatin was sent into pharmacy. Patient verbalized understanding.

## 2016-03-20 ENCOUNTER — Other Ambulatory Visit: Payer: Self-pay | Admitting: *Deleted

## 2016-03-20 MED ORDER — ENALAPRIL MALEATE 10 MG PO TABS: ORAL_TABLET | ORAL | 0 refills | Status: DC

## 2016-03-22 ENCOUNTER — Ambulatory Visit (INDEPENDENT_AMBULATORY_CARE_PROVIDER_SITE_OTHER): Payer: 59 | Admitting: Family Medicine

## 2016-03-22 ENCOUNTER — Encounter: Payer: Self-pay | Admitting: Family Medicine

## 2016-03-22 VITALS — BP 132/78 | Ht 71.5 in | Wt 253.0 lb

## 2016-03-22 DIAGNOSIS — Z79899 Other long term (current) drug therapy: Secondary | ICD-10-CM

## 2016-03-22 DIAGNOSIS — I10 Essential (primary) hypertension: Secondary | ICD-10-CM | POA: Diagnosis not present

## 2016-03-22 DIAGNOSIS — E785 Hyperlipidemia, unspecified: Secondary | ICD-10-CM | POA: Diagnosis not present

## 2016-03-22 DIAGNOSIS — G8929 Other chronic pain: Secondary | ICD-10-CM | POA: Diagnosis not present

## 2016-03-22 DIAGNOSIS — N5201 Erectile dysfunction due to arterial insufficiency: Secondary | ICD-10-CM | POA: Diagnosis not present

## 2016-03-22 DIAGNOSIS — M25552 Pain in left hip: Secondary | ICD-10-CM

## 2016-03-22 DIAGNOSIS — Z125 Encounter for screening for malignant neoplasm of prostate: Secondary | ICD-10-CM

## 2016-03-22 MED ORDER — ENALAPRIL MALEATE 10 MG PO TABS: ORAL_TABLET | ORAL | 1 refills | Status: DC

## 2016-03-22 MED ORDER — ATORVASTATIN CALCIUM 40 MG PO TABS: ORAL_TABLET | ORAL | 5 refills | Status: DC

## 2016-03-22 MED ORDER — VERAPAMIL HCL ER 180 MG PO TBCR: EXTENDED_RELEASE_TABLET | ORAL | 5 refills | Status: DC

## 2016-03-22 MED ORDER — TADALAFIL 5 MG PO TABS: 5.0000 mg | ORAL_TABLET | Freq: Every day | ORAL | 5 refills | Status: DC

## 2016-03-22 NOTE — Progress Notes (Signed)
   Subjective:    Patient ID: Craig GoreSteven D Michon, male    DOB: Mar 30, 1966, 50 y.o.   MRN: [id number]  HPI    Review of Systems     Objective:   Physical Exam        Assessment & Plan:

## 2016-03-22 NOTE — Progress Notes (Signed)
   Subjective:    Patient ID: Craig Robertson, male    DOB: 06/26/1966, 50 y.o.   MRN: [id number]  Hyperlipidemia  This is a chronic problem. The current episode started more than 1 year ago. Treatments tried: lipitor. Compliance problems include adherence to diet and adherence to exercise.    Results for orders placed or performed in visit on 04/08/15  Lipid panel  Result Value Ref Range   Cholesterol, Total 132 100 - 199 mg/dL   Triglycerides 64 0 - 149 mg/dL   HDL 57 >78>39 mg/dL   VLDL Cholesterol Cal 13 5 - 40 mg/dL   LDL Calculated 62 0 - 99 mg/dL   Chol/HDL Ratio 2.3 0.0 - 5.0 ratio units  Hepatic function panel  Result Value Ref Range   Total Protein 7.2 6.0 - 8.5 g/dL   Albumin 4.3 3.5 - 5.5 g/dL   Bilirubin Total 0.7 0.0 - 1.2 mg/dL   Bilirubin, Direct 2.950.21 0.00 - 0.40 mg/dL   Alkaline Phosphatase 64 39 - 117 IU/L   AST 35 0 - 40 IU/L   ALT 38 0 - 44 IU/L   Patient continues to take lipid medication regularly. No obvious side effects from it. Generally does not miss a dose. Prior blood work results are reviewed with patient. Patient continues to work on fat intake in diet  Blood pressure medicine and blood pressure levels reviewed today with patient. Compliant with blood pressure medicine. States does not miss a dose. No obvious side effects. Blood pressure generally good when checked elsewhere. Watching salt intake.   Left leg pain. Off and on for awhile. Deep in the left hip. On further history literally has been going on for years. Limiting the patient's ability to do things that he wants to do.  No hx of major injruy       Review of Systems No headache, no major weight loss or weight gain, no chest pain no back pain abdominal pain no change in bowel habits complete ROS otherwise negative     Objective:   Physical Exam  Alert vitals stable, NAD. Blood pressure good on repeat. HEENT normal. Lungs clear. Heart regular rate and rhythm. Left hip positive pain  with rotation. Negative straight leg raise. Ankles without edema      Assessment & Plan:  Impression 1 hypertension discuss good control maintain same meds #2 hyperlipidemia status uncertain prior blood work discussed maintain same for now #3 erectile dysfunction ongoing daily Cialis definitely helps wants to stay on will continue #4 hip pain etiology unclear

## 2016-03-30 ENCOUNTER — Telehealth: Payer: Self-pay | Admitting: Family Medicine

## 2016-03-30 NOTE — Telephone Encounter (Signed)
Left message on voicemail to return call.

## 2016-03-30 NOTE — Telephone Encounter (Signed)
Notified patient with his health history would recommend continuing the 81 mg aspirin. His regular doctor-Dr. Judeth CornfieldSteve-will be back next week we will send this message for his review. Obviously if the patient starts having severe epigastric pain discomfort or any signs of rectal bleeding would recommend stopping both medicines and notifying us in getting checked right away the chances of this happening is low. Patient verbalized understanding.

## 2016-03-30 NOTE — Telephone Encounter (Signed)
With his health history I would recommend continuing the 81 mg aspirin. His regular doctor-Dr. Judeth CornfieldSteve-will be back next week we will send this message for his review. Obviously if the patient starts having severe epigastric pain discomfort or any signs of rectal bleeding I would recommend stopping both medicines and notifying us in getting checked right away the chances of this happening is low.p

## 2016-03-30 NOTE — Telephone Encounter (Signed)
Patient went to the orthopedic today and was prescribed meloxicam.  He was told to call today and ask his PCP if its okay for him to take his aspirin.

## 2016-03-31 LAB — BASIC METABOLIC PANEL
BUN/Creatinine Ratio: 12 (ref 9–20)
BUN: 10 mg/dL (ref 6–24)
CALCIUM: 9.7 mg/dL (ref 8.7–10.2)
CHLORIDE: 102 mmol/L (ref 96–106)
CO2: 27 mmol/L (ref 18–29)
Creatinine, Ser: 0.86 mg/dL (ref 0.76–1.27)
GFR, EST AFRICAN AMERICAN: 119 mL/min/{1.73_m2} (ref >59)
GFR, EST NON AFRICAN AMERICAN: 103 mL/min/{1.73_m2} (ref >59)
Glucose: 97 mg/dL (ref 65–99)
POTASSIUM: 4.6 mmol/L (ref 3.5–5.2)
Sodium: 142 mmol/L (ref 134–144)

## 2016-03-31 LAB — HEPATIC FUNCTION PANEL
ALBUMIN: 4.5 g/dL (ref 3.5–5.5)
ALT: 36 IU/L (ref 0–44)
AST: 37 IU/L (ref 0–40)
Alkaline Phosphatase: 72 IU/L (ref 39–117)
BILIRUBIN TOTAL: 0.5 mg/dL (ref 0.0–1.2)
Bilirubin, Direct: 0.14 mg/dL (ref 0.00–0.40)
Total Protein: 7.6 g/dL (ref 6.0–8.5)

## 2016-03-31 LAB — LIPID PANEL
CHOL/HDL RATIO: 2.1 ratio (ref 0.0–5.0)
Cholesterol, Total: 131 mg/dL (ref 100–199)
HDL: 61 mg/dL (ref >39)
LDL CALC: 57 mg/dL (ref 0–99)
Triglycerides: 64 mg/dL (ref 0–149)
VLDL Cholesterol Cal: 13 mg/dL (ref 5–40)

## 2016-03-31 LAB — PSA: Prostate Specific Ag, Serum: 1 ng/mL (ref 0.0–4.0)

## 2016-03-31 NOTE — Telephone Encounter (Signed)
I agree with asa 81 mg with hx

## 2016-04-01 ENCOUNTER — Encounter: Payer: Self-pay | Admitting: Family Medicine

## 2016-04-05 ENCOUNTER — Other Ambulatory Visit: Payer: Self-pay

## 2016-04-05 MED ORDER — ATORVASTATIN CALCIUM 40 MG PO TABS: ORAL_TABLET | ORAL | 1 refills | Status: DC

## 2016-04-17 ENCOUNTER — Telehealth: Payer: Self-pay | Admitting: Family Medicine

## 2016-04-17 NOTE — Telephone Encounter (Signed)
Pt needs order for CPAP & supplies Is changing DME provifders to get one closer to his home  [facility] [address], [place] [phone]  THey will need order for Auto CPAP & supplies (I will write order & give to doc for signature)  Call pt when done

## 2016-04-20 NOTE — Telephone Encounter (Signed)
Gave order to Dr. Brett CanalesSteve for signature  Fx# [phone]

## 2016-04-23 ENCOUNTER — Telehealth: Payer: Self-pay | Admitting: Family Medicine

## 2016-04-23 NOTE — Telephone Encounter (Signed)
Faxed signed order with sleep study results, demographics, & insurance info to   [facility] [address], [place], [place] Ph# [phone] Fx# [phone]

## 2016-04-23 NOTE — Telephone Encounter (Signed)
[facility] called to notify us that they are not in network with pt's [employer]  Try [facility]  LMOVM to notify pt

## 2016-08-17 ENCOUNTER — Encounter: Payer: Self-pay | Admitting: Nurse Practitioner

## 2016-08-17 ENCOUNTER — Ambulatory Visit (INDEPENDENT_AMBULATORY_CARE_PROVIDER_SITE_OTHER): Payer: 59 | Admitting: Nurse Practitioner

## 2016-08-17 VITALS — BP 140/84 | Temp 98.5°F | Ht 71.5 in | Wt 259.4 lb

## 2016-08-17 DIAGNOSIS — N410 Acute prostatitis: Secondary | ICD-10-CM

## 2016-08-17 DIAGNOSIS — R300 Dysuria: Secondary | ICD-10-CM | POA: Diagnosis not present

## 2016-08-17 LAB — POCT UA - MICROSCOPIC ONLY
BACTERIA, U MICROSCOPIC: 0
EPITHELIAL CELLS, URINE PER MICROSCOPY: 0
RBC, URINE, MICROSCOPIC: 0
WBC, Ur, HPF, POC: 0

## 2016-08-17 LAB — POCT URINALYSIS DIPSTICK
Spec Grav, UA: 1.02
pH, UA: 5

## 2016-08-17 MED ORDER — CIPROFLOXACIN HCL 500 MG PO TABS: 500.0000 mg | ORAL_TABLET | Freq: Two times a day (BID) | ORAL | 0 refills | Status: DC

## 2016-08-17 NOTE — Patient Instructions (Signed)
Prostatitis Prostatitis is swelling of the prostate gland. The prostate helps to make semen. It is below a man's bladder, in front of the rectum. There are different types of prostatitis. Follow these instructions at home:  Take over-the-counter and prescription medicines only as told by your doctor.  If you were prescribed an antibiotic medicine, take it as told by your doctor. Do not stop taking the antibiotic even if you start to feel better.  If your doctor prescribed exercises, do them as directed.  Take sitz baths as told by your doctor. To take a sitz bath, sit in warm water that is deep enough to cover your hips and butt.  Keep all follow-up visits as told by your doctor. This is important. Contact a doctor if:  Your symptoms get worse.  You have a fever. Get help right away if:  You have chills.  You feel sick to your stomach (nauseous).  You throw up (vomit).  You feel light-headed.  You feel like you might pass out (faint).  You cannot pee (urinate).  You have blood or clumps of blood (blood clots) in your pee (urine). This information is not intended to replace advice given to you by your health care provider. Make sure you discuss any questions you have with your health care provider. Document Released: 01/08/2012 Document Revised: 03/29/2016 Document Reviewed: 03/29/2016 Elsevier Interactive Patient Education  2017 Elsevier Inc.  

## 2016-08-18 ENCOUNTER — Encounter: Payer: Self-pay | Admitting: Nurse Practitioner

## 2016-08-18 NOTE — Progress Notes (Signed)
Subjective:  Presents for c/o constant urinary burning and itching for the past week especially when urinating. No fever. No penile discharge. No rash. No new sexual partners. Slight change in urinary stream. Feels like bladder does not empty all the way. No change in urgency. Some frequency. No back or lower abdominal pain. Bowels nl. No pain with defecation. No recent prostatitis.   Objective:   BP 140/84   Temp 98.5 F (36.9 C) (Oral)   Ht 5' 11.5" (1.816 m)   Wt 259 lb 6.4 oz (117.7 kg)   BMI 35.67 kg/m  NAD. Alert, oriented. Lungs clear. Heart RRR. Abdomen soft, non tender. Prostate exam: boggy, slightly enlarged and mildly tender.  Results for orders placed or performed in visit on 08/17/16  POCT urinalysis dipstick  Result Value Ref Range   Color, UA     Clarity, UA     Glucose, UA     Bilirubin, UA     Ketones, UA     Spec Grav, UA 1.020    Blood, UA     pH, UA 5.0    Protein, UA     Urobilinogen, UA     Nitrite, UA     Leukocytes, UA  Negative  POCT UA - Microscopic Only  Result Value Ref Range   WBC, Ur, HPF, POC 0    RBC, urine, microscopic 0    Bacteria, U Microscopic 0    Mucus, UA     Epithelial cells, urine per micros 0    Crystals, Ur, HPF, POC     Casts, Ur, LPF, POC     Yeast, UA       Assessment:  Dysuria - Plan: POCT urinalysis dipstick, Chlamydia/Gonococcus/Trichomonas, NAA, POCT UA - Microscopic Only  Acute prostatitis    Plan:  Meds ordered this encounter  Medications  . ciprofloxacin (CIPRO) 500 MG tablet    Sig: Take 1 tablet (500 mg total) by mouth 2 (two) times daily. X 2 weeks    Dispense:  28 tablet    Refill:  0    Order Specific Question:   Supervising Provider    Answer:   Merlyn AlbertLUKING, WILLIAM S [[id number]]   STD testing pending. Warning signs reviewed. Call back by the end of next week if no improvement, sooner if worse.

## 2016-08-21 LAB — CHLAMYDIA/GONOCOCCUS/TRICHOMONAS, NAA
Chlamydia by NAA: NEGATIVE
Gonococcus by NAA: NEGATIVE
Trich vag by NAA: NEGATIVE

## 2016-09-04 ENCOUNTER — Telehealth: Payer: Self-pay

## 2016-09-04 NOTE — Telephone Encounter (Signed)
Patient is going to call his insurance company and see if they cover this class of medication and what is preferred under his plan. Patient will call back when he talks to insurance company if he needs us to change medication.

## 2016-09-04 NOTE — Telephone Encounter (Signed)
Patient needs to contact insurance company to find out what is preferred under his insurance plan

## 2016-09-04 NOTE — Telephone Encounter (Signed)
Cialis 5MG  tablets has been rejected by insurance.  Non-preferred agents typically have a higher patient co-pay than health insurance plan preferred agents.  This is a non-preferred med. Do you want to prescribe a preferred drug or proceed with PA?

## 2016-09-04 NOTE — Telephone Encounter (Signed)
Lets try a preferred drug need to know our choices

## 2016-09-12 ENCOUNTER — Other Ambulatory Visit: Payer: Self-pay | Admitting: Family Medicine

## 2016-09-14 ENCOUNTER — Telehealth: Payer: Self-pay | Admitting: Family Medicine

## 2016-09-14 NOTE — Telephone Encounter (Signed)
Patient is needing Rx sent over for c-pap supplies.  He says he was told his Rx has expired.  [facility]

## 2016-09-17 NOTE — Telephone Encounter (Signed)
done

## 2016-09-17 NOTE — Telephone Encounter (Signed)
Faxed order to [facility] 

## 2016-09-17 NOTE — Telephone Encounter (Signed)
Order for CPAP supplies in green folder in yellow box for Dr. Steve to sign  [facility] - fax # [phone]

## 2016-10-01 ENCOUNTER — Other Ambulatory Visit: Payer: Self-pay | Admitting: Family Medicine

## 2016-10-14 ENCOUNTER — Other Ambulatory Visit: Payer: Self-pay | Admitting: Family Medicine

## 2016-10-22 ENCOUNTER — Other Ambulatory Visit: Payer: Self-pay | Admitting: Family Medicine

## 2016-11-18 ENCOUNTER — Other Ambulatory Visit: Payer: Self-pay | Admitting: Family Medicine

## 2016-11-21 ENCOUNTER — Ambulatory Visit: Payer: 59 | Admitting: Family Medicine

## 2016-11-24 ENCOUNTER — Other Ambulatory Visit: Payer: Self-pay | Admitting: Family Medicine

## 2016-11-28 ENCOUNTER — Encounter: Payer: Self-pay | Admitting: Nurse Practitioner

## 2016-11-28 ENCOUNTER — Ambulatory Visit (INDEPENDENT_AMBULATORY_CARE_PROVIDER_SITE_OTHER): Payer: 59 | Admitting: Nurse Practitioner

## 2016-11-28 VITALS — BP 138/88 | HR 77 | Temp 97.2°F | Ht 71.5 in | Wt 271.8 lb

## 2016-11-28 DIAGNOSIS — J310 Chronic rhinitis: Secondary | ICD-10-CM

## 2016-11-28 DIAGNOSIS — J301 Allergic rhinitis due to pollen: Secondary | ICD-10-CM | POA: Diagnosis not present

## 2016-11-28 DIAGNOSIS — J329 Chronic sinusitis, unspecified: Secondary | ICD-10-CM

## 2016-11-28 MED ORDER — ALBUTEROL SULFATE HFA 108 (90 BASE) MCG/ACT IN AERS: 2.0000 | INHALATION_SPRAY | RESPIRATORY_TRACT | 0 refills | Status: DC | PRN

## 2016-11-28 MED ORDER — AZITHROMYCIN 250 MG PO TABS: ORAL_TABLET | ORAL | 0 refills | Status: DC

## 2016-11-28 MED ORDER — METHYLPREDNISOLONE ACETATE 40 MG/ML IJ SUSP
40.0000 mg | Freq: Once | INTRAMUSCULAR | Status: AC
  Administered 2016-11-28: 40 mg via INTRAMUSCULAR

## 2016-11-28 MED ORDER — PREDNISONE 20 MG PO TABS: ORAL_TABLET | ORAL | 0 refills | Status: DC

## 2016-11-28 NOTE — Progress Notes (Signed)
Subjective:  Presents for c/o sore throat and congestion for the past 2 weeks. No fever. Sore throat mainly in the mornings. Facial area pressure. Runny nose. Sneezing. Cough mainly in am; non productive. Wears CPAP. Some difficulty due to head congestion. No ear pain. Slight wheeze at times and tightness with deep breath. Unassociated with activity. Non smoker.   Objective:   BP 138/88   Pulse 77   Temp 97.2 F (36.2 C) (Oral)   Ht 5' 11.5" (1.816 m)   Wt 271 lb 12.8 oz (123.3 kg)   SpO2 98%   BMI 37.38 kg/m  NAD. Alert, oriented. TMs significantly retracted bilat; no erythema. Nasal mucosa pale and very boggy to the point of occlusion. Pharynx mildly injected. Neck supple with mild anterior adenopathy. Lungs clear. No tachypnea. Heart RRR.   Assessment:  Rhinosinusitis - Plan: methylPREDNISolone acetate (DEPO-MEDROL) injection 40 mg  Seasonal allergic rhinitis due to pollen - Plan: methylPREDNISolone acetate (DEPO-MEDROL) injection 40 mg    Plan:   Meds ordered this encounter  Medications  . azithromycin (ZITHROMAX Z-PAK) 250 MG tablet    Sig: Take 2 tablets (500 mg) on  Day 1,  followed by 1 tablet (250 mg) once daily on Days 2 through 5.    Dispense:  6 each    Refill:  0    Order Specific Question:   Supervising Provider    Answer:   Merlyn AlbertLUKING, WILLIAM S [[id number]]  . predniSONE (DELTASONE) 20 MG tablet    Sig: 3 po qd x 3 d then 2 po qd x 3 d then 1 po qd x 2 d    Dispense:  17 tablet    Refill:  0    Order Specific Question:   Supervising Provider    Answer:   Merlyn AlbertLUKING, WILLIAM S [[id number]]  . albuterol (PROVENTIL HFA;VENTOLIN HFA) 108 (90 Base) MCG/ACT inhaler    Sig: Inhale 2 puffs into the lungs every 4 (four) hours as needed.    Dispense:  1 Inhaler    Refill:  0    Order Specific Question:   Supervising Provider    Answer:   Merlyn AlbertLUKING, WILLIAM S [[id number]]  . methylPREDNISolone acetate (DEPO-MEDROL) injection 40 mg   Start Prednisone if no improvement in symptoms over the next 2-3  days. OTC meds as directed. Recheck if worsens or persists.

## 2016-11-28 NOTE — Patient Instructions (Addendum)
Zyrtec flonase, rhinocort, nasacort Zaditor eye drops

## 2016-12-21 ENCOUNTER — Telehealth: Payer: Self-pay | Admitting: Family Medicine

## 2016-12-21 NOTE — Telephone Encounter (Signed)
Patient is going to be seen on 12/28/16 by Dr. Brett CanalesSteve for clearance for hip surgery.  I spoke with Ophelia ShoulderAndrea Earles, HR Generalist at [facility].  We are going to treat him as a self-pay patient on that DOS because we do not file workers comp.  Both the patient and Sue Lushndrea understand that we do not file worker's comp.  She explains that without our clearance, [name] will not be able to have his hip surgery.  Sue Lushndrea has guaranteed that she will instruct [name] to bring the invoice to her from that DOS and she will get it to the correct person to make sure it is paid directly to us.

## 2016-12-27 ENCOUNTER — Other Ambulatory Visit: Payer: Self-pay | Admitting: Family Medicine

## 2016-12-28 ENCOUNTER — Ambulatory Visit (INDEPENDENT_AMBULATORY_CARE_PROVIDER_SITE_OTHER): Payer: Self-pay | Admitting: Family Medicine

## 2016-12-28 ENCOUNTER — Encounter: Payer: Self-pay | Admitting: Family Medicine

## 2016-12-28 VITALS — BP 142/80 | Ht 71.5 in | Wt 267.0 lb

## 2016-12-28 DIAGNOSIS — I10 Essential (primary) hypertension: Secondary | ICD-10-CM

## 2016-12-28 NOTE — Progress Notes (Signed)
   Subjective:    Patient ID: Craig Robertson, male    DOB: 1966/07/05, 51 y.o.   MRN: [id number] HPI Patient in today for surgical clearance for Left hip. [facility] Dr.Olin will be doing the surgery.  Planning to do 26 th visit  Breathing now ovdrall better than one mo ago  Uses the nasal strip at night   BP running good, overall good numbrs    Plans to be in the hospital one to w o days  Then will need rehab   Took a bad fall and had a hip fracture   States no other concerns this visit.    Review of Systems No headache, no major weight loss or weight gain, no chest pain no back pain abdominal pain no change in bowel habits complete ROS otherwise negative     Objective:   Physical Exam Alert vitals stable, NAD. Blood pressure good on repeat. HEENT normal. Lungs clear. Heart regular rate and rhythm.        Assessment & Plan:  Patient arrives office for preop discussion. He has hypertension and hyperlipidemia in good control. 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 At this time not taking testosterone overall I think he is reasonable surgical risk and besides he needs his surgery with near disabling pain occurring after a traumatic hip injury form filled out and sent

## 2017-01-06 NOTE — H&P (Signed)
TOTAL HIP ADMISSION H&P  Patient is admitted for left total hip arthroplasty, anterior approach.  Subjective:  Chief Complaint:      Left hip primary OA / pain  HPI: Epifania GoreSteven D Neira, 51 y.o. male, has a history of pain and functional disability in the left hip(s) due to arthritis and patient has failed non-surgical conservative treatments for greater than 12 weeks to include NSAID's and/or analgesics, corticosteriod injections and activity modification.  Onset of symptoms was gradual starting ~1 years ago with gradually worsening course since that time.The patient noted no past surgery on the left hip(s).  Patient currently rates pain in the left hip at 8 out of 10 with activity. Patient has night pain, worsening of pain with activity and weight bearing, trendelenberg gait, pain that interfers with activities of daily living and pain with passive range of motion. Patient has evidence of periarticular osteophytes and joint space narrowing by imaging studies. This condition presents safety issues increasing the risk of falls.  There is no current active infection.   Risks, benefits and expectations were discussed with the patient.  Risks including but not limited to the risk of anesthesia, blood clots, nerve damage, blood vessel damage, failure of the prosthesis, infection and up to and including death.  Patient understand the risks, benefits and expectations and wishes to proceed with surgery.    PCP: Merlyn AlbertLuking, William S, MD  D/C Plans:       Home   Post-op Meds:       No Rx given  Tranexamic Acid:      To be given - IV  Decadron:      Is to be given  FYI:     ASA  Norco (pt states it's ok even with codeine allergy listed)  CPAP  DME:    Rx given for - RW and 3-n-1  PT:    No PT    Patient Active Problem List   Diagnosis Date Noted  . Erectile dysfunction 04/24/2015  . Cerebrovascular accident (stroke) (HCC) 12/14/2013  . Other and unspecified hyperlipidemia 12/14/2013  . Muscle  weakness (generalized) 12/08/2013  . Decreased range of motion of upper extremity 12/08/2013  . 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 Hemiparesis, right (HCC) 11/23/2013  . 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 Lumbar radiculopathy 11/22/2013  . Essential hypertension, benign 12/01/2012  . Hypogonadism male 12/01/2012   Past Medical History:  Diagnosis Date  . ED (erectile dysfunction)   . Hyperlipidemia   . Hypertension   . Low testosterone     No past surgical history on file.   No prescriptions prior to admission.   Allergies  Allergen Reactions  . Codeine Nausea And Vomiting    Social History  Substance Use Topics  . Smoking status: Never Smoker  . Smokeless tobacco: Never Used  . Alcohol use Not on file    Family History  Problem Relation Age of Onset  . Heart attack Mother   . Hypertension Father   . Leukemia Father   . Hypertension Brother   . Diabetes Cousin      Review of Systems  Constitutional: Negative.   HENT: Negative.   Eyes: Negative.   Respiratory: Negative.   Cardiovascular: Negative.   Gastrointestinal: Positive for constipation.  Genitourinary: Negative.   Musculoskeletal: Positive for joint pain.  Skin: Negative.   Neurological: Negative.   Endo/Heme/Allergies: Negative.   Psychiatric/Behavioral: Negative.     Objective:  Physical Exam  Constitutional: He is  oriented to person, place, and time. He appears well-developed.  HENT:  Head: Normocephalic.  Eyes: Pupils are equal, round, and reactive to light.  Neck: Neck supple. No JVD present. No tracheal deviation present. No thyromegaly present.  Cardiovascular: Normal rate, regular rhythm and intact distal pulses.   Respiratory: Effort normal and breath sounds normal. No respiratory distress. He has no wheezes.  GI: Soft. There is no tenderness. There is no guarding.  Musculoskeletal:       Left hip: He exhibits decreased range of motion, decreased strength,  tenderness and bony tenderness. He exhibits no swelling, no deformity and no laceration.  Lymphadenopathy:    He has no cervical adenopathy.  Neurological: He is alert and oriented to person, place, and time.  Skin: Skin is warm and dry.  Psychiatric: He has a normal mood and affect.      Labs:  Estimated body mass index is 36.72 kg/m as calculated from the following:   Height as of 12/28/16: 5' 11.5" (1.816 m).   Weight as of 12/28/16: 121.1 kg (267 lb).   Imaging Review Plain radiographs demonstrate severe degenerative joint disease of the left hip(s). The bone quality appears to be good for age and reported activity level.  Assessment/Plan:  End stage arthritis, left hip  The patient history, physical examination, clinical judgement of the provider and imaging studies are consistent with end stage degenerative joint disease of the left hip(s) and total hip arthroplasty is deemed medically necessary. The treatment options including medical management, injection therapy, arthroscopy and arthroplasty were discussed at length. The risks and benefits of total hip arthroplasty were presented and reviewed. The risks due to aseptic loosening, infection, stiffness, dislocation/subluxation,  thromboembolic complications and other imponderables were discussed.  The patient acknowledged the explanation, agreed to proceed with the plan and consent was signed. Patient is being admitted for inpatient treatment for surgery, pain control, PT, OT, prophylactic antibiotics, VTE prophylaxis, progressive ambulation and ADL's and discharge planning.The patient is planning to be discharged home.      Anastasio Auerbach Hasina Kreager   PA-C  01/06/2017, 11:14 AM

## 2017-01-10 ENCOUNTER — Other Ambulatory Visit (HOSPITAL_COMMUNITY): Payer: Self-pay | Admitting: Emergency Medicine

## 2017-01-10 NOTE — Progress Notes (Signed)
LOV/ surgical clearance Luking MD 12-28-16 epic

## 2017-01-10 NOTE — Patient Instructions (Signed)
Craig GoreSteven D Robertson  01/10/2017   Your procedure is scheduled on: 01-15-17  Report to [facility] Main  Entrance    Report to admitting at [facility]9AM   Call this number if you have problems the morning of surgery  [phone]   Remember: ONLY 1 PERSON MAY GO WITH YOU TO SHORT STAY TO GET  READY MORNING OF YOUR SURGERY.  Do not eat food or drink liquids :After Midnight.     Bring CPAP mask and tubing only. Device will be provided for you!   Take these medicines the morning of surgery with A SIP OF WATER: verapamil(verelan), hydrocodone as needed , inhaler as needed (may bring to hospital)                                 You may not have any metal on your body including hair pins and              piercings  Do not wear jewelry, make-up, lotions, powders or perfumes, deodorant                      Men may shave face and neck.   Do not bring valuables to the hospital. [place] IS NOT             RESPONSIBLE   FOR VALUABLES.  Contacts, dentures or bridgework may not be worn into surgery.  Leave suitcase in the car. After surgery it may be brought to your room.                Please read over the following fact sheets you were given: _____________________________________________________________________             [facility] - Preparing for Surgery Before surgery, you can play an important role.  Because skin is not sterile, your skin needs to be as free of germs as possible.  You can reduce the number of germs on your skin by washing with CHG (chlorahexidine gluconate) soap before surgery.  CHG is an antiseptic cleaner which kills germs and bonds with the skin to continue killing germs even after washing. Please DO NOT use if you have an allergy to CHG or antibacterial soaps.  If your skin becomes reddened/irritated stop using the CHG and inform your nurse when you arrive at Short Stay. Do not shave (including legs and underarms) for at least 48 hours prior to  the first CHG shower.  You may shave your face/neck. Please follow these instructions carefully:  1.  Shower with CHG Soap the night before surgery and the  morning of Surgery.  2.  If you choose to wash your hair, wash your hair first as usual with your  normal  shampoo.  3.  After you shampoo, rinse your hair and body thoroughly to remove the  shampoo.                           4.  Use CHG as you would any other liquid soap.  You can apply chg directly  to the skin and wash                       Gently with a scrungie or clean washcloth.  5.  Apply the CHG  Soap to your body ONLY FROM THE NECK DOWN.   Do not use on face/ open                           Wound or open sores. Avoid contact with eyes, ears mouth and genitals (private parts).                       Wash face,  Genitals (private parts) with your normal soap.             6.  Wash thoroughly, paying special attention to the area where your surgery  will be performed.  7.  Thoroughly rinse your body with warm water from the neck down.  8.  DO NOT shower/wash with your normal soap after using and rinsing off  the CHG Soap.                9.  Pat yourself dry with a clean towel.            10.  Wear clean pajamas.            11.  Place clean sheets on your bed the night of your first shower and do not  sleep with pets. Day of Surgery : Do not apply any lotions/deodorants the morning of surgery.  Please wear clean clothes to the hospital/surgery center.  FAILURE TO FOLLOW THESE INSTRUCTIONS MAY RESULT IN THE CANCELLATION OF YOUR SURGERY PATIENT SIGNATURE_________________________________  NURSE SIGNATURE__________________________________  ________________________________________________________________________   Adam Phenix  An incentive spirometer is a tool that can help keep your lungs clear and active. This tool measures how well you are filling your lungs with each breath. Taking long deep breaths may help reverse or  decrease the chance of developing breathing (pulmonary) problems (especially infection) following:  A long period of time when you are unable to move or be active. BEFORE THE PROCEDURE   If the spirometer includes an indicator to show your best effort, your nurse or respiratory therapist will set it to a desired goal.  If possible, sit up straight or lean slightly forward. Try not to slouch.  Hold the incentive spirometer in an upright position. INSTRUCTIONS FOR USE  1. Sit on the edge of your bed if possible, or sit up as far as you can in bed or on a chair. 2. Hold the incentive spirometer in an upright position. 3. Breathe out normally. 4. Place the mouthpiece in your mouth and seal your lips tightly around it. 5. Breathe in slowly and as deeply as possible, raising the piston or the ball toward the top of the column. 6. Hold your breath for 3-5 seconds or for as long as possible. Allow the piston or ball to fall to the bottom of the column. 7. Remove the mouthpiece from your mouth and breathe out normally. 8. Rest for a few seconds and repeat Steps 1 through 7 at least 10 times every 1-2 hours when you are awake. Take your time and take a few normal breaths between deep breaths. 9. The spirometer may include an indicator to show your best effort. Use the indicator as a goal to work toward during each repetition. 10. After each set of 10 deep breaths, practice coughing to be sure your lungs are clear. If you have an incision (the cut made at the time of surgery), support your incision when coughing by placing a pillow or rolled up towels  firmly against it. Once you are able to get out of bed, walk around indoors and cough well. You may stop using the incentive spirometer when instructed by your caregiver.  RISKS AND COMPLICATIONS  Take your time so you do not get dizzy or light-headed.  If you are in pain, you may need to take or ask for pain medication before doing incentive spirometry.  It is harder to take a deep breath if you are having pain. AFTER USE  Rest and breathe slowly and easily.  It can be helpful to keep track of a log of your progress. Your caregiver can provide you with a simple table to help with this. If you are using the spirometer at home, follow these instructions: [place] IF:   You are having difficultly using the spirometer.  You have trouble using the spirometer as often as instructed.  Your pain medication is not giving enough relief while using the spirometer.  You develop fever of 100.5 F (38.1 C) or higher. SEEK IMMEDIATE MEDICAL CARE IF:   You cough up bloody sputum that had not been present before.  You develop fever of 102 F (38.9 C) or greater.  You develop worsening pain at or near the incision site. MAKE SURE YOU:   Understand these instructions.  Will watch your condition.  Will get help right away if you are not doing well or get worse. Document Released: 11/19/2006 Document Revised: 10/01/2011 Document Reviewed: 01/20/2007 Exit[employer] Patient Information 2014 ExitCare, [place].   ________________________________________________________________________  WHAT IS A BLOOD TRANSFUSION? Blood Transfusion Information  A transfusion is the replacement of blood or some of its parts. Blood is made up of multiple cells which provide different functions.  Red blood cells carry oxygen and are used for blood loss replacement.  White blood cells fight against infection.  Platelets control bleeding.  Plasma helps clot blood.  Other blood products are available for specialized needs, such as hemophilia or other clotting disorders. BEFORE THE TRANSFUSION  Who gives blood for transfusions?   Healthy volunteers who are fully evaluated to make sure their blood is safe. This is blood bank blood. Transfusion therapy is the safest it has ever been in the practice of medicine. Before blood is taken from a donor, a complete  history is taken to make sure that person has no history of diseases nor engages in risky social behavior (examples are intravenous drug use or sexual activity with multiple partners). The donor's travel history is screened to minimize risk of transmitting infections, such as malaria. The donated blood is tested for signs of infectious diseases, such as HIV and hepatitis. The blood is then tested to be sure it is compatible with you in order to minimize the chance of a transfusion reaction. If you or a relative donates blood, this is often done in anticipation of surgery and is not appropriate for emergency situations. It takes many days to process the donated blood. RISKS AND COMPLICATIONS Although transfusion therapy is very safe and saves many lives, the main dangers of transfusion include:   Getting an infectious disease.  Developing a transfusion reaction. This is an allergic reaction to something in the blood you were given. Every precaution is taken to prevent this. The decision to have a blood transfusion has been considered carefully by your caregiver before blood is given. Blood is not given unless the benefits outweigh the risks. AFTER THE TRANSFUSION  Right after receiving a blood transfusion, you will usually feel much better  and more energetic. This is especially true if your red blood cells have gotten low (anemic). The transfusion raises the level of the red blood cells which carry oxygen, and this usually causes an energy increase.  The nurse administering the transfusion will monitor you carefully for complications. HOME CARE INSTRUCTIONS  No special instructions are needed after a transfusion. You may find your energy is better. Speak with your caregiver about any limitations on activity for underlying diseases you may have. SEEK MEDICAL CARE IF:   Your condition is not improving after your transfusion.  You develop redness or irritation at the intravenous (IV) site. SEEK  IMMEDIATE MEDICAL CARE IF:  Any of the following symptoms occur over the next 12 hours:  Shaking chills.  You have a temperature by mouth above 102 F (38.9 C), not controlled by medicine.  Chest, back, or muscle pain.  People around you feel you are not acting correctly or are confused.  Shortness of breath or difficulty breathing.  Dizziness and fainting.  You get a rash or develop hives.  You have a decrease in urine output.  Your urine turns a dark color or changes to pink, red, or brown. Any of the following symptoms occur over the next 10 days:  You have a temperature by mouth above 102 F (38.9 C), not controlled by medicine.  Shortness of breath.  Weakness after normal activity.  The white part of the eye turns yellow (jaundice).  You have a decrease in the amount of urine or are urinating less often.  Your urine turns a dark color or changes to pink, red, or brown. Document Released: 07/06/2000 Document Revised: 10/01/2011 Document Reviewed: 02/23/2008 [facility] Patient Information 2014 [place], [place].  _______________________________________________________________________

## 2017-01-11 ENCOUNTER — Encounter (HOSPITAL_COMMUNITY)
Admission: RE | Admit: 2017-01-11 | Discharge: 2017-01-11 | Disposition: A | Discharge status: Home / Self Care | Payer: Worker's Compensation | Source: Ambulatory Visit | Attending: Orthopedic Surgery | Admitting: Orthopedic Surgery

## 2017-01-11 ENCOUNTER — Other Ambulatory Visit: Payer: Self-pay | Admitting: Family Medicine

## 2017-01-11 ENCOUNTER — Encounter (HOSPITAL_COMMUNITY): Payer: Self-pay

## 2017-01-11 DIAGNOSIS — R9431 Abnormal electrocardiogram [ECG] [EKG]: Secondary | ICD-10-CM | POA: Diagnosis not present

## 2017-01-11 DIAGNOSIS — M1612 Unilateral primary osteoarthritis, left hip: Secondary | ICD-10-CM | POA: Insufficient documentation

## 2017-01-11 DIAGNOSIS — I10 Essential (primary) hypertension: Secondary | ICD-10-CM | POA: Diagnosis not present

## 2017-01-11 DIAGNOSIS — Z0183 Encounter for blood typing: Secondary | ICD-10-CM | POA: Insufficient documentation

## 2017-01-11 DIAGNOSIS — Z01812 Encounter for preprocedural laboratory examination: Secondary | ICD-10-CM | POA: Insufficient documentation

## 2017-01-11 DIAGNOSIS — Z01818 Encounter for other preprocedural examination: Secondary | ICD-10-CM | POA: Diagnosis present

## 2017-01-11 HISTORY — DX: Unspecified osteoarthritis, unspecified site: M19.90

## 2017-01-11 HISTORY — DX: Personal history of other diseases of the digestive system: Z87.19

## 2017-01-11 HISTORY — DX: Dyspnea, unspecified: R06.00

## 2017-01-11 HISTORY — DX: Cerebral infarction, unspecified: I63.9

## 2017-01-11 LAB — CBC
HEMATOCRIT: 38.3 % — AB (ref 39.0–52.0)
Hemoglobin: 12.9 g/dL — ABNORMAL LOW (ref 13.0–17.0)
MCH: 29.9 pg (ref 26.0–34.0)
MCHC: 33.7 g/dL (ref 30.0–36.0)
MCV: 88.9 fL (ref 78.0–100.0)
Platelets: 290 10*3/uL (ref 150–400)
RBC: 4.31 MIL/uL (ref 4.22–5.81)
RDW: 12.8 % (ref 11.5–15.5)
WBC: 6.8 10*3/uL (ref 4.0–10.5)

## 2017-01-11 LAB — ABO/RH: ABO/RH(D): B POS

## 2017-01-11 LAB — BASIC METABOLIC PANEL
ANION GAP: 6 (ref 5–15)
BUN: 12 mg/dL (ref 6–20)
CO2: 30 mmol/L (ref 22–32)
Calcium: 9.3 mg/dL (ref 8.9–10.3)
Chloride: 100 mmol/L — ABNORMAL LOW (ref 101–111)
Creatinine, Ser: 0.95 mg/dL (ref 0.61–1.24)
GFR calc Af Amer: >60 mL/min (ref >60)
Glucose, Bld: 352 mg/dL — ABNORMAL HIGH (ref 65–99)
POTASSIUM: 4.5 mmol/L (ref 3.5–5.1)
SODIUM: 136 mmol/L (ref 135–145)

## 2017-01-11 LAB — SURGICAL PCR SCREEN
MRSA, PCR: NEGATIVE
Staphylococcus aureus: NEGATIVE

## 2017-01-11 NOTE — Progress Notes (Signed)
Patient with abnormal EKG at pre-op and hx of TIA some years ago. RN took EKG and reviewed with Dr Okey Dupreose of anesthesia along with pertinent medical hx and medciations. Rose gave ok to proceed with surgery. No further recommendations. RN informed patient of recommendation and patient verbalized understanding.

## 2017-01-11 NOTE — Progress Notes (Signed)
BMP routed via epic to Dr Olin  

## 2017-01-14 MED ORDER — DEXTROSE 5 % IV SOLN
3.0000 g | INTRAVENOUS | Status: DC
  Filled 2017-01-14: qty 3000

## 2017-01-15 ENCOUNTER — Encounter (HOSPITAL_COMMUNITY)
Admission: RE | Disposition: A | Discharge status: Home / Self Care | Payer: Self-pay | Source: Ambulatory Visit | Attending: Orthopedic Surgery

## 2017-01-15 ENCOUNTER — Inpatient Hospital Stay (HOSPITAL_COMMUNITY): Payer: Worker's Compensation | Admitting: Certified Registered"

## 2017-01-15 ENCOUNTER — Ambulatory Visit (HOSPITAL_COMMUNITY)
Admission: RE | Admit: 2017-01-15 | Discharge: 2017-01-15 | Disposition: A | Discharge status: Home / Self Care | Payer: Worker's Compensation | Source: Ambulatory Visit | Attending: Orthopedic Surgery | Admitting: Orthopedic Surgery

## 2017-01-15 ENCOUNTER — Encounter (HOSPITAL_COMMUNITY): Payer: Self-pay | Admitting: *Deleted

## 2017-01-15 DIAGNOSIS — Z8673 Personal history of transient ischemic attack (TIA), and cerebral infarction without residual deficits: Secondary | ICD-10-CM | POA: Insufficient documentation

## 2017-01-15 DIAGNOSIS — I10 Essential (primary) hypertension: Secondary | ICD-10-CM | POA: Diagnosis not present

## 2017-01-15 DIAGNOSIS — M1612 Unilateral primary osteoarthritis, left hip: Secondary | ICD-10-CM | POA: Insufficient documentation

## 2017-01-15 DIAGNOSIS — Z5309 Procedure and treatment not carried out because of other contraindication: Secondary | ICD-10-CM | POA: Insufficient documentation

## 2017-01-15 LAB — TYPE AND SCREEN
ABO/RH(D): B POS
ANTIBODY SCREEN: NEGATIVE

## 2017-01-15 LAB — GLUCOSE, CAPILLARY: Glucose-Capillary: 160 mg/dL — ABNORMAL HIGH (ref 65–99)

## 2017-01-15 SURGERY — ARTHROPLASTY, HIP, TOTAL, ANTERIOR APPROACH
Anesthesia: Spinal | Site: Hip | Laterality: Left

## 2017-01-15 MED ORDER — TRANEXAMIC ACID 1000 MG/10ML IV SOLN
1000.0000 mg | INTRAVENOUS | Status: DC
  Filled 2017-01-15: qty 10

## 2017-01-15 MED ORDER — LIDOCAINE 2% (20 MG/ML) 5 ML SYRINGE
INTRAMUSCULAR | Status: AC
  Filled 2017-01-15: qty 15

## 2017-01-15 MED ORDER — FENTANYL CITRATE (PF) 100 MCG/2ML IJ SOLN
INTRAMUSCULAR | Status: AC
  Filled 2017-01-15: qty 2

## 2017-01-15 MED ORDER — SUGAMMADEX SODIUM 200 MG/2ML IV SOLN
INTRAVENOUS | Status: AC
  Filled 2017-01-15: qty 2

## 2017-01-15 MED ORDER — DEXAMETHASONE SODIUM PHOSPHATE 10 MG/ML IJ SOLN: 10.0000 mg | Freq: Once | INTRAMUSCULAR | Status: DC

## 2017-01-15 MED ORDER — PHENYLEPHRINE 40 MCG/ML (10ML) SYRINGE FOR IV PUSH (FOR BLOOD PRESSURE SUPPORT)
PREFILLED_SYRINGE | INTRAVENOUS | Status: AC
  Filled 2017-01-15: qty 10

## 2017-01-15 MED ORDER — ALBUMIN HUMAN 5 % IV SOLN
INTRAVENOUS | Status: AC
  Filled 2017-01-15: qty 500

## 2017-01-15 MED ORDER — CHLORHEXIDINE GLUCONATE 4 % EX LIQD: 60.0000 mL | Freq: Once | CUTANEOUS | Status: DC

## 2017-01-15 MED ORDER — OXYCODONE HCL 5 MG/5ML PO SOLN
5.0000 mg | Freq: Once | ORAL | Status: DC | PRN
  Filled 2017-01-15: qty 5

## 2017-01-15 MED ORDER — MEPERIDINE HCL 50 MG/ML IJ SOLN: 6.2500 mg | INTRAMUSCULAR | Status: DC | PRN

## 2017-01-15 MED ORDER — OXYCODONE HCL 5 MG PO TABS: 5.0000 mg | ORAL_TABLET | Freq: Once | ORAL | Status: DC | PRN

## 2017-01-15 MED ORDER — ONDANSETRON HCL 4 MG/2ML IJ SOLN
INTRAMUSCULAR | Status: AC
  Filled 2017-01-15: qty 4

## 2017-01-15 MED ORDER — LACTATED RINGERS IV SOLN
INTRAVENOUS | Status: DC
  Administered 2017-01-15: 10:00:00 via INTRAVENOUS

## 2017-01-15 MED ORDER — PROPOFOL 10 MG/ML IV BOLUS
INTRAVENOUS | Status: AC
  Filled 2017-01-15: qty 20

## 2017-01-15 MED ORDER — DEXAMETHASONE SODIUM PHOSPHATE 10 MG/ML IJ SOLN
INTRAMUSCULAR | Status: AC
  Filled 2017-01-15: qty 1

## 2017-01-15 MED ORDER — HYDROMORPHONE HCL 1 MG/ML IJ SOLN: 0.2500 mg | INTRAMUSCULAR | Status: DC | PRN

## 2017-01-15 MED ORDER — PROMETHAZINE HCL 25 MG/ML IJ SOLN: 6.2500 mg | INTRAMUSCULAR | Status: DC | PRN

## 2017-01-15 MED ORDER — ROCURONIUM BROMIDE 50 MG/5ML IV SOSY
PREFILLED_SYRINGE | INTRAVENOUS | Status: AC
  Filled 2017-01-15: qty 10

## 2017-01-15 MED ORDER — MIDAZOLAM HCL 2 MG/2ML IJ SOLN
INTRAMUSCULAR | Status: AC
  Filled 2017-01-15: qty 2

## 2017-01-15 MED ORDER — EPHEDRINE 5 MG/ML INJ
INTRAVENOUS | Status: AC
  Filled 2017-01-15: qty 20

## 2017-01-15 NOTE — Interval H&P Note (Signed)
History and Physical Interval Note:  01/15/2017 10:07 AM  Craig Robertson  has presented today for surgery, with the diagnosis of Left hip osteoarthritis  The various methods of treatment have been discussed with the patient and family. After consideration of risks, benefits and other options for treatment, the patient has consented to  Procedure(s): LEFT TOTAL HIP ARTHROPLASTY ANTERIOR APPROACH (Left) as a surgical intervention .  The patient's history has been reviewed, patient examined, no change in status, stable for surgery.  I have reviewed the patient's chart and labs.  Questions were answered to the patient's satisfaction.     [name],Weiland Tomich D

## 2017-01-15 NOTE — Anesthesia Preprocedure Evaluation (Deleted)
Anesthesia Evaluation  Patient identified by MRN, date of birth, ID band Patient awake    Reviewed: Allergy & Precautions, NPO status , Patient's Chart, lab work & pertinent test results  Airway Mallampati: II  TM Distance: >3 FB Neck ROM: Full    Dental no notable dental hx.    Pulmonary shortness of breath,    Pulmonary exam normal breath sounds clear to auscultation       Cardiovascular hypertension, Pt. on medications Normal cardiovascular exam Rhythm:Regular Rate:Normal     Neuro/Psych  Neuromuscular disease CVA negative psych ROS   GI/Hepatic negative GI ROS, Neg liver ROS,   Endo/Other  diabetes, Poorly Controlled  Renal/GU negative Renal ROS     Musculoskeletal negative musculoskeletal ROS (+) Arthritis ,   Abdominal   Peds  Hematology negative hematology ROS (+)   Anesthesia Other Findings   Reproductive/Obstetrics                             Anesthesia Physical Anesthesia Plan  ASA: III  Anesthesia Plan:    Post-op Pain Management:    Induction:   PONV Risk Score and Plan:   Airway Management Planned:   Additional Equipment:   Intra-op Plan:   Post-operative Plan:   Informed Consent: I have reviewed the patients History and Physical, chart, labs and discussed the procedure including the risks, benefits and alternatives for the proposed anesthesia with the patient or authorized representative who has indicated his/her understanding and acceptance.   Dental advisory given  Plan Discussed with: CRNA  Anesthesia Plan Comments:         Anesthesia Quick Evaluation

## 2017-01-15 NOTE — Progress Notes (Signed)
Surgery cancelled, pt to follow up with primary care MD regarding Blood sugar x 2. Pt and wife verbalize understanding of instructions.  Upset but understand. Discharged to home.

## 2017-01-15 NOTE — Progress Notes (Signed)
Anesthesia Eval Pt with preop non fasting FSBG of 352. Fasting FSBG 160 this AM. No diagnosis of DM. Discussed with Dr. Charlann Boxerlin and with the patient and wife that untreated DM is a contraindication to elective surgery. I recommended that he go back to his PCP for DM workup and therapy. He needs to have 2 weeks of normoglycemia prior to being brought to the operating room.

## 2017-01-16 ENCOUNTER — Encounter: Payer: Self-pay | Admitting: Family Medicine

## 2017-01-16 ENCOUNTER — Ambulatory Visit (INDEPENDENT_AMBULATORY_CARE_PROVIDER_SITE_OTHER): Payer: 59 | Admitting: Family Medicine

## 2017-01-16 VITALS — BP 128/84 | Ht 71.5 in | Wt 261.8 lb

## 2017-01-16 DIAGNOSIS — R739 Hyperglycemia, unspecified: Secondary | ICD-10-CM | POA: Diagnosis not present

## 2017-01-16 DIAGNOSIS — E118 Type 2 diabetes mellitus with unspecified complications: Secondary | ICD-10-CM

## 2017-01-16 DIAGNOSIS — E119 Type 2 diabetes mellitus without complications: Secondary | ICD-10-CM | POA: Insufficient documentation

## 2017-01-16 LAB — POCT GLYCOSYLATED HEMOGLOBIN (HGB A1C): HEMOGLOBIN A1C: 7

## 2017-01-16 MED ORDER — METFORMIN HCL 500 MG PO TABS: ORAL_TABLET | ORAL | 5 refills | Status: DC

## 2017-01-16 NOTE — Patient Instructions (Addendum)
Free educ session at [facility] , need to do  Diabetes Mellitus and Standards of Medical Care Managing diabetes (diabetes mellitus) can be complicated. Your diabetes treatment may be managed by a team of health care providers, including:  A diet and nutrition specialist (registered dietitian).  A nurse.  A certified diabetes educator (CDE).  A diabetes specialist (endocrinologist).  An eye doctor.  A primary care provider.  A dentist.  Your health care providers follow a schedule in order to help you get the best quality of care. The following schedule is a general guideline for your diabetes management plan. Your health care providers may also give you more specific instructions. HbA1c ( hemoglobin A1c) test This test provides information about blood sugar (glucose) control over the previous 2-3 months. It is used to check whether your diabetes management plan needs to be adjusted.  If you are meeting your treatment goals, this test is done at least 2 times a year.  If you are not meeting treatment goals or if your treatment goals have changed, this test is done 4 times a year.  Blood pressure test  This test is done at every routine medical visit. For most people, the goal is less than 130/80. Ask your health care provider what your goal blood pressure should be. Dental and eye exams  Visit your dentist two times a year.  If you have type 1 diabetes, get an eye exam 3-5 years after you are diagnosed, and then once a year after your first exam. ? If you were diagnosed with type 1 diabetes as a child, get an eye exam when you are age 45 or older and have had diabetes for 3-5 years. After the first exam, you should get an eye exam once a year.  If you have type 2 diabetes, have an eye exam as soon as you are diagnosed, and then once a year after your first exam. Foot care exam  Visual foot exams are done at every routine medical visit. The exams check for cuts,  bruises, redness, blisters, sores, or other problems with the feet.  A complete foot exam is done by your health care provider once a year. This exam includes an inspection of the structure and skin of your feet, and a check of the pulses and sensation in your feet. ? Type 1 diabetes: Get your first exam 3-5 years after diagnosis. ? Type 2 diabetes: Get your first exam as soon as you are diagnosed.  Check your feet every day for cuts, bruises, redness, blisters, or sores. If you have any of these or other problems that are not healing, contact your health care provider. Kidney function test ( urine microalbumin)  This test is done once a year. ? Type 1 diabetes: Get your first test 5 years after diagnosis. ? Type 2 diabetes: Get your first test as soon as you are diagnosed.  If you have chronic kidney disease (CKD), get a serum creatinine and estimated glomerular filtration rate (eGFR) test once a year. Lipid profile (cholesterol, HDL, LDL, triglycerides)  This test should be done when you are diagnosed with diabetes, and every 5 years after the first test. If you are on medicines to lower your cholesterol, you may need to get this test done every year. ? The goal for LDL is less than 100 mg/dL (5.5 mmol/L). If you are at high risk, the goal is less than 70 mg/dL (3.9 mmol/L). ? The goal for HDL is 40 mg/dL (  2.2 mmol/L) for men and 50 mg/dL(2.8 mmol/L) for women. An HDL cholesterol of 60 mg/dL (3.3 mmol/L) or higher gives some protection against heart disease. ? The goal for triglycerides is less than 150 mg/dL (8.3 mmol/L). Immunizations  The yearly flu (influenza) vaccine is recommended for everyone 6 months or older who has diabetes.  The pneumonia (pneumococcal) vaccine is recommended for everyone 2 years or older who has diabetes. If you are 8 or older, you may get the pneumonia vaccine as a series of two separate shots.  The hepatitis B vaccine is recommended for adults shortly  after they have been diagnosed with diabetes.  The Tdap (tetanus, diphtheria, and pertussis) vaccine should be given: ? According to normal childhood vaccination schedules, for children. ? Every 10 years, for adults who have diabetes.  The shingles vaccine is recommended for people who have had chicken pox and are 50 years or older. Mental and emotional health  Screening for symptoms of eating disorders, anxiety, and depression is recommended at the time of diagnosis and afterward as needed. If your screening shows that you have symptoms (you have a positive screening result), you may need further evaluation and be referred to a mental health care provider. Diabetes self-management education  Education about how to manage your diabetes is recommended at diagnosis and ongoing as needed. Treatment plan  Your treatment plan will be reviewed at every medical visit. Summary  Managing diabetes (diabetes mellitus) can be complicated. Your diabetes treatment may be managed by a team of health care providers.  Your health care providers follow a schedule in order to help you get the best quality of care.  Standards of care including having regular physical exams, blood tests, blood pressure monitoring, immunizations, screening tests, and education about how to manage your diabetes.  Your health care providers may also give you more specific instructions based on your individual health. This information is not intended to replace advice given to you by your health care provider. Make sure you discuss any questions you have with your health care provider. Document Released: 05/06/2009 Document Revised: 04/06/2016 Document Reviewed: 04/06/2016 Elsevier Interactive Patient Education  2018 [employer].  Diabetes Mellitus and Exercise Exercising regularly is important for your overall health, especially when you have diabetes (diabetes mellitus). Exercising is not only about losing weight. It has  many health benefits, such as increasing muscle strength and bone density and reducing body fat and stress. This leads to improved fitness, flexibility, and endurance, all of which result in better overall health. Exercise has additional benefits for people with diabetes, including:  Reducing appetite.  Helping to lower and control blood glucose.  Lowering blood pressure.  Helping to control amounts of fatty substances (lipids) in the blood, such as cholesterol and triglycerides.  Helping the body to respond better to insulin (improving insulin sensitivity).  Reducing how much insulin the body needs.  Decreasing the risk for heart disease by: ? Lowering cholesterol and triglyceride levels. ? Increasing the levels of good cholesterol. ? Lowering blood glucose levels.  What is my activity plan? Your health care provider or certified diabetes educator can help you make a plan for the type and frequency of exercise (activity plan) that works for you. Make sure that you:  Do at least 150 minutes of moderate-intensity or vigorous-intensity exercise each week. This could be brisk walking, biking, or water aerobics. ? Do stretching and strength exercises, such as yoga or weightlifting, at least 2 times a week. ? Spread out  your activity over at least 3 days of the week.  Get some form of physical activity every day. ? Do not go more than 2 days in a row without some kind of physical activity. ? Avoid being inactive for more than 90 minutes at a time. Take frequent breaks to walk or stretch.  Choose a type of exercise or activity that you enjoy, and set realistic goals.  Start slowly, and gradually increase the intensity of your exercise over time.  What do I need to know about managing my diabetes?  Check your blood glucose before and after exercising. ? If your blood glucose is higher than 240 mg/dL (13.3 mmol/L) before you exercise, check your urine for ketones. If you have ketones in  your urine, do not exercise until your blood glucose returns to normal.  Know the symptoms of low blood glucose (hypoglycemia) and how to treat it. Your risk for hypoglycemia increases during and after exercise. Common symptoms of hypoglycemia can include: ? Hunger. ? Anxiety. ? Sweating and feeling clammy. ? Confusion. ? Dizziness or feeling light-headed. ? Increased heart rate or palpitations. ? Blurry vision. ? Tingling or numbness around the mouth, lips, or tongue. ? Tremors or shakes. ? Irritability.  Keep a rapid-acting carbohydrate snack available before, during, and after exercise to help prevent or treat hypoglycemia.  Avoid injecting insulin into areas of the body that are going to be exercised. For example, avoid injecting insulin into: ? The arms, when playing tennis. ? The legs, when jogging.  Keep records of your exercise habits. Doing this can help you and your health care provider adjust your diabetes management plan as needed. Write down: ? Food that you eat before and after you exercise. ? Blood glucose levels before and after you exercise. ? The type and amount of exercise you have done. ? When your insulin is expected to peak, if you use insulin. Avoid exercising at times when your insulin is peaking.  When you start a new exercise or activity, work with your health care provider to make sure the activity is safe for you, and to adjust your insulin, medicines, or food intake as needed.  Drink plenty of water while you exercise to prevent dehydration or heat stroke. Drink enough fluid to keep your urine clear or pale yellow. This information is not intended to replace advice given to you by your health care provider. Make sure you discuss any questions you have with your health care provider. Document Released: 09/29/2003 Document Revised: 01/27/2016 Document Reviewed: 12/19/2015 Elsevier Interactive Patient Education  2018 [employer].

## 2017-01-16 NOTE — Progress Notes (Signed)
   Subjective:    Patient ID: Craig GoreSteven D Dennard, male    DOB: 07-14-1966, [age] y.o.   MRN: [id number]  HPI Patient arrives to discuss elevated sugar on preop exam.   Patient was due for hip replacement yesterday and they cancelled his surgery due to elevated sugar.  Def incr urin and incr thirst  Some nocturia    Mo has diab, on shots  Diet not good, pt likes ginger ale   Some potoato chips etc.  Results for orders placed or performed in visit on 01/16/17  POCT glycosylated hemoglobin (Hb A1C)  Result Value Ref Range   Hemoglobin A1C 7.0    Not exrcising  No alcool  Urin more frequently  Review of Systems No headache, no major weight loss or weight gain, no chest pain no back pain abdominal pain no change in bowel habits complete ROS otherwise negative     Objective:   Physical Exam Alert and oriented, vitals reviewed and stable, NAD ENT-TM's and ext canals WNL bilat via otoscopic exam Soft palate, tonsils and post pharynx WNL via oropharyngeal exam Neck-symmetric, no masses; thyroid nonpalpable and nontender Pulmonary-no tachypnea or accessory muscle use; Clear without wheezes via auscultation Card--no abnrml murmurs, rhythm reg and rate WNL Carotid pulses symmetric, without bruits        Assessment & Plan:  Impression 1 type 2 diabetes. New-onset. Of note sugar was excellent last fall. Strong family history. Hip surgery had to be delayed due to impressive hyperglycemia. Patient notes dietary poor habits. Long discussion held. Regarding the nature of type 2 diabetes treatment short and long-term ramifications. Diet. Exercise etc. plan Will prescribe a glucometer check sugars at least twice per week fasting. Educational information given regarding hypoglycemia. Initiate metformin. Diet discussed. Session at the hospital strongly encourage. Yearly eye checkups. We'll recheck in this a few weeks with patient's motivation to get better ASAP for surgery

## 2017-01-21 ENCOUNTER — Other Ambulatory Visit: Payer: Self-pay | Admitting: *Deleted

## 2017-01-21 ENCOUNTER — Telehealth: Payer: Self-pay | Admitting: Family Medicine

## 2017-01-21 ENCOUNTER — Encounter: Payer: Self-pay | Admitting: *Deleted

## 2017-01-21 MED ORDER — ONDANSETRON 8 MG PO TBDP: 8.0000 mg | ORAL_TABLET | Freq: Three times a day (TID) | ORAL | 1 refills | Status: DC | PRN

## 2017-01-21 NOTE — Telephone Encounter (Signed)
Pt called stating that the metformin is causing him to have diarrhea and nausea. Pt is wanting to know if there is something else he can take. Please advise.

## 2017-01-21 NOTE — Telephone Encounter (Signed)
Pt called stating he has been experiencing diarrhea( x 1)  and nausea ( constantly) since starting the Metformin. He states he felt better taking the Metformin 500 mg one BID x 2 days, but once he started the two BID he feels awful.He really does not know what his blood sugars have been running he says he does know that one day it was 97 and yesterday it was 138 after eating. Wants to know if there is something else he can try or can we call in something for the nausea. Please advise. Thanks

## 2017-01-21 NOTE — Telephone Encounter (Signed)
Please talk with the patient. See if he can tolerate taking 1 tablet twice daily. Many individuals can tolerate this dose better then 2 tablets twice a day. He would still need to check his sugars periodically a few times per week in the morning a few times a week either before supper or bedtime. Obviously if the nausea does not go away after being on one tablet twice daily for a week then we should consider starting a completely different medicine. Please let the patient know that met Shanda Bumps does reduce the risk of other diabetic complications. Therefore I would like for him to try the reduced dose. Please discuss with the patient see what he is willing to do. He may also use Zofran 8 mg 1 3 times a day when necessary nausea, #20, 1 refill the patient if willing should try the above then let us know in 1 week how he is doing-if he definitely wants to no longer take metformin I will need to start a different medicine

## 2017-01-21 NOTE — Telephone Encounter (Signed)
Discussed with pt. Pt verbalized understanding. Pt states he will try to take one bid for one week and give us an update in one week. He will record sugars and let us know also. zofran sent to pharm.

## 2017-01-24 ENCOUNTER — Other Ambulatory Visit: Payer: Self-pay | Admitting: Family Medicine

## 2017-02-08 ENCOUNTER — Encounter: Payer: Self-pay | Admitting: Family Medicine

## 2017-02-08 ENCOUNTER — Ambulatory Visit (INDEPENDENT_AMBULATORY_CARE_PROVIDER_SITE_OTHER): Payer: 59 | Admitting: Family Medicine

## 2017-02-08 VITALS — BP 128/84 | Ht 72.0 in | Wt 261.5 lb

## 2017-02-08 DIAGNOSIS — E118 Type 2 diabetes mellitus with unspecified complications: Secondary | ICD-10-CM | POA: Diagnosis not present

## 2017-02-08 LAB — POCT GLYCOSYLATED HEMOGLOBIN (HGB A1C): Hemoglobin A1C: 7.2

## 2017-02-08 NOTE — Progress Notes (Signed)
   Subjective:    Patient ID: Craig Robertson, male    DOB: Oct 30, 1965, 51 y.o.   MRN: [id number]  Diabetes  He presents for his follow-up diabetic visit. He has type 2 diabetes mellitus.  Patient had loose stools on metformin. Has backed off to one tablet twice per day brings in his sugars. Overall running good control.  Results for orders placed or performed in visit on 02/08/17  POCT HgB A1C  Result Value Ref Range   Hemoglobin A1C 7.2     Patient states no other concerns this visit.   Did not go to session  Numbers ovrall good    Review of Systems No headache, no major weight loss or weight gain, no chest pain no back pain abdominal pain no change in bowel habits complete ROS otherwise negative     Objective:   Physical Exam  Alert vitals stable, NAD. Blood pressure good on repeat. HEENT normal. Lungs clear. Heart regular rate and rhythm.       Assessment & Plan:  Impression 1 type 2 diabetes control stable. Patient has surgery pending plan patient may press on with surgery. We will send a fax to patient's orthopedic surgeon in this regard. Diet exercise discussed. Follow-up as scheduled.

## 2017-02-11 ENCOUNTER — Telehealth: Payer: Self-pay | Admitting: Family Medicine

## 2017-02-11 NOTE — Telephone Encounter (Signed)
This was faxed

## 2017-02-11 NOTE — Telephone Encounter (Signed)
Spoke with patient's mother and informed her per Dr.Steve Luking- letter being faxed to Dr.Olins office. Patient verbalized understanding.

## 2017-02-11 NOTE — Telephone Encounter (Signed)
Done today.

## 2017-02-11 NOTE — Telephone Encounter (Signed)
Patient said that Dr. Brett CanalesSteve was supposed to send something over to Dr. Nilsa Nuttinglin's office on Friday so that he could go ahead and schedule his surgery.  He wants to know if this has been done?

## 2017-02-11 NOTE — Telephone Encounter (Signed)
Pt called to check on this. Pt states he is needing this for surgery.

## 2017-02-12 ENCOUNTER — Other Ambulatory Visit: Payer: Self-pay | Admitting: Family Medicine

## 2017-02-20 NOTE — Progress Notes (Signed)
Please place orders in epic as patient is being scheduled for a pre-op appointment! Thank you!

## 2017-02-22 NOTE — H&P (Signed)
TOTAL HIP ADMISSION H&P  Patient is admitted for left total hip arthroplasty, anterior approach.  Subjective:  Chief Complaint:   Left hip primary OA / pain  HPI: Craig Robertson, 51 y.o. male, has a history of pain and functional disability in the left hip(s) due to arthritis and patient has failed non-surgical conservative treatments for greater than 12 weeks to include NSAID's and/or analgesics, corticosteriod injections and activity modification.  Onset of symptoms was gradual starting ~1 years ago with gradually worsening course since that time.The patient noted no past surgery on the left hip(s).  Patient currently rates pain in the left hip at 8 out of 10 with activity. Patient has night pain, worsening of pain with activity and weight bearing, trendelenberg gait, pain that interfers with activities of daily living and pain with passive range of motion. Patient has evidence of periarticular osteophytes and joint space narrowing by imaging studies. This condition presents safety issues increasing the risk of falls.  There is no current active infection.  Risks, benefits and expectations were discussed with the patient.  Risks including but not limited to the risk of anesthesia, blood clots, nerve damage, blood vessel damage, failure of the prosthesis, infection and up to and including death.  Patient understand the risks, benefits and expectations and wishes to proceed with surgery.   PCP: Merlyn AlbertLuking, William S, MD  D/C Plans:       Home   Post-op Meds:       No Rx given   Tranexamic Acid:      To be given - IV   Decadron:      Is to be given  FYI:     ASA             Norco (pt states it's ok even with codeine allergy listed)             CPAP  DME:      Rx given for - RW and 3-n-1  PT:         No PT     Patient Active Problem List   Diagnosis Date Noted  . Type 2 diabetes mellitus (HCC) 01/16/2017  . Erectile dysfunction 04/24/2015  . Cerebrovascular accident (stroke) (HCC)  12/14/2013  . Other and unspecified hyperlipidemia 12/14/2013  . Muscle weakness (generalized) 12/08/2013  . Decreased range of motion of upper extremity 12/08/2013  . 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 Hemiparesis, right (HCC) 11/23/2013  . 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 Lumbar radiculopathy 11/22/2013  . Essential hypertension, benign 12/01/2012  . Hypogonadism male 12/01/2012   Past Medical History:  Diagnosis Date  . Arthritis   . Dyspnea    allegy season   . ED (erectile dysfunction)   . History of hiatal hernia   . Hyperlipidemia   . Hypertension   . Low testosterone   . Stroke [facility](HCC) 2015   TIA     Past Surgical History:  Procedure Laterality Date  . BACK SURGERY  2015   lumbar surgery for bulging disc at surgery center     No prescriptions prior to admission.   Allergies  Allergen Reactions  . Codeine Nausea And Vomiting    Social History  Substance Use Topics  . Smoking status: Never Smoker  . Smokeless tobacco: Never Used  . Alcohol use No    Family History  Problem Relation Age of Onset  . Diabetes Cousin   . Heart attack Mother   . Hypertension  Father   . Leukemia Father   . Hypertension Brother      Review of Systems  Constitutional: Negative.   HENT: Negative.   Eyes: Negative.   Respiratory: Negative.   Cardiovascular: Negative.   Gastrointestinal: Positive for constipation.  Genitourinary: Negative.   Musculoskeletal: Positive for joint pain.  Skin: Negative.   Neurological: Negative.   Endo/Heme/Allergies: Negative.   Psychiatric/Behavioral: Negative.     Objective:  Physical Exam  Constitutional: He is oriented to person, place, and time. He appears well-developed.  HENT:  Head: Normocephalic.  Eyes: Pupils are equal, round, and reactive to light.  Neck: Neck supple. No JVD present. No tracheal deviation present. No thyromegaly present.  Cardiovascular: Normal rate, regular rhythm and intact distal  pulses.   Respiratory: Effort normal and breath sounds normal. No respiratory distress. He has no wheezes.  GI: Soft. There is no tenderness. There is no guarding.  Musculoskeletal:       Left hip: He exhibits decreased range of motion, decreased strength, tenderness and bony tenderness. He exhibits no swelling, no deformity and no laceration.  Lymphadenopathy:    He has no cervical adenopathy.  Neurological: He is alert and oriented to person, place, and time.  Skin: Skin is warm and dry.  Psychiatric: He has a normal mood and affect.      Labs:  Estimated body mass index is 35.47 kg/m as calculated from the following:   Height as of 02/08/17: 6' (1.829 m).   Weight as of 02/08/17: 118.6 kg (261 lb 8 oz).   Imaging Review Plain radiographs demonstrate severe degenerative joint disease of the left hip(s). The bone quality appears to be good for age and reported activity level.  Assessment/Plan:  End stage arthritis, right hip(s)  The patient history, physical examination, clinical judgement of the provider and imaging studies are consistent with end stage degenerative joint disease of the left hip(s) and total hip arthroplasty is deemed medically necessary. The treatment options including medical management, injection therapy, arthroscopy and arthroplasty were discussed at length. The risks and benefits of total hip arthroplasty were presented and reviewed. The risks due to aseptic loosening, infection, stiffness, dislocation/subluxation,  thromboembolic complications and other imponderables were discussed.  The patient acknowledged the explanation, agreed to proceed with the plan and consent was signed. Patient is being admitted for inpatient treatment for surgery, pain control, PT, OT, prophylactic antibiotics, VTE prophylaxis, progressive ambulation and ADL's and discharge planning.The patient is planning to be discharged home.    Anastasio AuerbachMatthew S. Jocob Dambach   PA-C  02/23/2017, 8:56 PM

## 2017-02-23 ENCOUNTER — Other Ambulatory Visit: Payer: Self-pay | Admitting: Family Medicine

## 2017-02-26 ENCOUNTER — Other Ambulatory Visit (HOSPITAL_COMMUNITY): Payer: Self-pay | Admitting: Emergency Medicine

## 2017-02-26 NOTE — Patient Instructions (Signed)
Epifania GoreSteven D Wadlow  02/26/2017   Your procedure is scheduled on: 03-05-17  [facility]    Report to admitting at 6:10AM   Call this number if you have problems the morning of surgery [phone]   Remember: ONLY 1 PERSON MAY GO WITH YOU TO SHORT STAY TO GET  READY MORNING OF YOUR SURGERY.  Do not eat food or drink liquids :After Midnight.     Take these medicines the morning of surgery with A SIP OF WATER: inhaler as needed, verapamil                                 You may not have any metal on your body including hair pins and              piercings  Do not wear jewelry, make-up, lotions, powders or perfumes, deodorant                          Men may shave face and neck.   Do not bring valuables to the hospital. [facility] IS NOT             RESPONSIBLE   FOR VALUABLES.  Contacts, dentures or bridgework may not be worn into surgery.  Leave suitcase in the car. After surgery it may be brought to your room.                Please read over the following fact sheets you were given: _____________________________________________________________________      How to Manage Your Diabetes Before and After Surgery  Why is it important to control my blood sugar before and after surgery? . Improving blood sugar levels before and after surgery helps healing and can limit problems. . A way of improving blood sugar control is eating a healthy diet by: o  Eating less sugar and carbohydrates o  Increasing activity/exercise o  Talking with your doctor about reaching your blood sugar goals . High blood sugars (greater than 180 mg/dL) can raise your risk of infections and slow your recovery, so you will need to focus on controlling your diabetes during the weeks before surgery. . Make sure that the doctor who takes care of your diabetes knows about your planned surgery including the date and location.  How do I manage my blood sugar before  surgery? . Check your blood sugar at least 4 times a day, starting 2 days before surgery, to make sure that the level is not too high or low. o Check your blood sugar the morning of your surgery when you wake up and every 2 hours until you get to the Short Stay unit. . If your blood sugar is less than 70 mg/dL, you will need to treat for low blood sugar: o Do not take insulin. o Treat a low blood sugar (less than 70 mg/dL) with  cup of clear juice (cranberry or apple), 4 glucose tablets, OR glucose gel. o Recheck blood sugar in 15 minutes after treatment (to make sure it is greater than 70 mg/dL). If your blood sugar is not greater than 70 mg/dL on recheck, call [phone] for further instructions. . Report your blood sugar to the short stay nurse when you get to Short Stay.  . If you are admitted to  the hospital after surgery: o Your blood sugar will be checked by the staff and you will probably be given insulin after surgery (instead of oral diabetes medicines) to make sure you have good blood sugar levels. o The goal for blood sugar control after surgery is 80-180 mg/dL.   WHAT DO I DO ABOUT MY DIABETES MEDICATION?    . THE DAY BEFORE SURGERY, take    METFORMIN as normal     . THE MORNING OF SURGERY, do not take METFORMIN  Patient Signature:  Date:   Nurse Signature:  Date:   Reviewed and Endorsed by [facility] Patient Education Committee, August 2015       [facility] - Preparing for Surgery Before surgery, you can play an important role.  Because skin is not sterile, your skin needs to be as free of germs as possible.  You can reduce the number of germs on your skin by washing with CHG (chlorahexidine gluconate) soap before surgery.  CHG is an antiseptic cleaner which kills germs and bonds with the skin to continue killing germs even after washing. Please DO NOT use if you have an allergy to CHG or antibacterial soaps.  If your skin becomes reddened/irritated stop using the  CHG and inform your nurse when you arrive at Short Stay. Do not shave (including legs and underarms) for at least 48 hours prior to the first CHG shower.  You may shave your face/neck. Please follow these instructions carefully:  1.  Shower with CHG Soap the night before surgery and the  morning of Surgery.  2.  If you choose to wash your hair, wash your hair first as usual with your  normal  shampoo.  3.  After you shampoo, rinse your hair and body thoroughly to remove the  shampoo.                           4.  Use CHG as you would any other liquid soap.  You can apply chg directly  to the skin and wash                       Gently with a scrungie or clean washcloth.  5.  Apply the CHG Soap to your body ONLY FROM THE NECK DOWN.   Do not use on face/ open                           Wound or open sores. Avoid contact with eyes, ears mouth and genitals (private parts).                       Wash face,  Genitals (private parts) with your normal soap.             6.  Wash thoroughly, paying special attention to the area where your surgery  will be performed.  7.  Thoroughly rinse your body with warm water from the neck down.  8.  DO NOT shower/wash with your normal soap after using and rinsing off  the CHG Soap.                9.  Pat yourself dry with a clean towel.            10.  Wear clean pajamas.            11.  Place clean sheets  on your bed the night of your first shower and do not  sleep with pets. Day of Surgery : Do not apply any lotions/deodorants the morning of surgery.  Please wear clean clothes to the hospital/surgery center.  FAILURE TO FOLLOW THESE INSTRUCTIONS MAY RESULT IN THE CANCELLATION OF YOUR SURGERY PATIENT SIGNATURE_________________________________  NURSE SIGNATURE__________________________________  ________________________________________________________________________   Rogelia Mire  An incentive spirometer is a tool that can help keep your lungs clear  and active. This tool measures how well you are filling your lungs with each breath. Taking long deep breaths may help reverse or decrease the chance of developing breathing (pulmonary) problems (especially infection) following:  A long period of time when you are unable to move or be active. BEFORE THE PROCEDURE   If the spirometer includes an indicator to show your best effort, your nurse or respiratory therapist will set it to a desired goal.  If possible, sit up straight or lean slightly forward. Try not to slouch.  Hold the incentive spirometer in an upright position. INSTRUCTIONS FOR USE  1. Sit on the edge of your bed if possible, or sit up as far as you can in bed or on a chair. 2. Hold the incentive spirometer in an upright position. 3. Breathe out normally. 4. Place the mouthpiece in your mouth and seal your lips tightly around it. 5. Breathe in slowly and as deeply as possible, raising the piston or the ball toward the top of the column. 6. Hold your breath for 3-5 seconds or for as long as possible. Allow the piston or ball to fall to the bottom of the column. 7. Remove the mouthpiece from your mouth and breathe out normally. 8. Rest for a few seconds and repeat Steps 1 through 7 at least 10 times every 1-2 hours when you are awake. Take your time and take a few normal breaths between deep breaths. 9. The spirometer may include an indicator to show your best effort. Use the indicator as a goal to work toward during each repetition. 10. After each set of 10 deep breaths, practice coughing to be sure your lungs are clear. If you have an incision (the cut made at the time of surgery), support your incision when coughing by placing a pillow or rolled up towels firmly against it. Once you are able to get out of bed, walk around indoors and cough well. You may stop using the incentive spirometer when instructed by your caregiver.  RISKS AND COMPLICATIONS  Take your time so you do not  get dizzy or light-headed.  If you are in pain, you may need to take or ask for pain medication before doing incentive spirometry. It is harder to take a deep breath if you are having pain. AFTER USE  Rest and breathe slowly and easily.  It can be helpful to keep track of a log of your progress. Your caregiver can provide you with a simple table to help with this. If you are using the spirometer at home, follow these instructions: SEEK MEDICAL CARE IF:   You are having difficultly using the spirometer.  You have trouble using the spirometer as often as instructed.  Your pain medication is not giving enough relief while using the spirometer.  You develop fever of 100.5 F (38.1 C) or higher. SEEK IMMEDIATE MEDICAL CARE IF:   You cough up bloody sputum that had not been present before.  You develop fever of 102 F (38.9 C) or greater.  You develop  worsening pain at or near the incision site. MAKE SURE YOU:   Understand these instructions.  Will watch your condition.  Will get help right away if you are not doing well or get worse. Document Released: 11/19/2006 Document Revised: 10/01/2011 Document Reviewed: 01/20/2007 Exit[employer] Patient Information 2014 ExitCare, [place].   ________________________________________________________________________  WHAT IS A BLOOD TRANSFUSION? Blood Transfusion Information  A transfusion is the replacement of blood or some of its parts. Blood is made up of multiple cells which provide different functions.  Red blood cells carry oxygen and are used for blood loss replacement.  White blood cells fight against infection.  Platelets control bleeding.  Plasma helps clot blood.  Other blood products are available for specialized needs, such as hemophilia or other clotting disorders. BEFORE THE TRANSFUSION  Who gives blood for transfusions?   Healthy volunteers who are fully evaluated to make sure their blood is safe. This is blood bank  blood. Transfusion therapy is the safest it has ever been in the practice of medicine. Before blood is taken from a donor, a complete history is taken to make sure that person has no history of diseases nor engages in risky social behavior (examples are intravenous drug use or sexual activity with multiple partners). The donor's travel history is screened to minimize risk of transmitting infections, such as malaria. The donated blood is tested for signs of infectious diseases, such as HIV and hepatitis. The blood is then tested to be sure it is compatible with you in order to minimize the chance of a transfusion reaction. If you or a relative donates blood, this is often done in anticipation of surgery and is not appropriate for emergency situations. It takes many days to process the donated blood. RISKS AND COMPLICATIONS Although transfusion therapy is very safe and saves many lives, the main dangers of transfusion include:   Getting an infectious disease.  Developing a transfusion reaction. This is an allergic reaction to something in the blood you were given. Every precaution is taken to prevent this. The decision to have a blood transfusion has been considered carefully by your caregiver before blood is given. Blood is not given unless the benefits outweigh the risks. AFTER THE TRANSFUSION  Right after receiving a blood transfusion, you will usually feel much better and more energetic. This is especially true if your red blood cells have gotten low (anemic). The transfusion raises the level of the red blood cells which carry oxygen, and this usually causes an energy increase.  The nurse administering the transfusion will monitor you carefully for complications. HOME CARE INSTRUCTIONS  No special instructions are needed after a transfusion. You may find your energy is better. Speak with your caregiver about any limitations on activity for underlying diseases you may have. SEEK MEDICAL CARE IF:    Your condition is not improving after your transfusion.  You develop redness or irritation at the intravenous (IV) site. SEEK IMMEDIATE MEDICAL CARE IF:  Any of the following symptoms occur over the next 12 hours:  Shaking chills.  You have a temperature by mouth above 102 F (38.9 C), not controlled by medicine.  Chest, back, or muscle pain.  People around you feel you are not acting correctly or are confused.  Shortness of breath or difficulty breathing.  Dizziness and fainting.  You get a rash or develop hives.  You have a decrease in urine output.  Your urine turns a dark color or changes to pink, red, or brown. Any of the  following symptoms occur over the next 10 days:  You have a temperature by mouth above 102 F (38.9 C), not controlled by medicine.  Shortness of breath.  Weakness after normal activity.  The white part of the eye turns yellow (jaundice).  You have a decrease in the amount of urine or are urinating less often.  Your urine turns a dark color or changes to pink, red, or brown. Document Released: 07/06/2000 Document Revised: 10/01/2011 Document Reviewed: 02/23/2008 [facility] Patient Information 2014 [place], [place].  _______________________________________________________________________

## 2017-02-26 NOTE — Progress Notes (Signed)
hga1c 02-08-17 epic  Surgical clearance Dr Gerda DissLuking 02-08-17 epic "Impression 1 type 2 diabetes control stable. Patient has surgery pending plan patient may press on with surgery. We will send a fax to patient's orthopedic surgeon in this regard."  OV Dr Gerda DissLuking 12-28-16 epic ; per Dr Gerda DissLuking "He has hypertension and hyperlipidemia in good control. 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 At this time not taking testosterone overall I think he is reasonable surgical risk "  LOV neurology 01-2014 Dr Marisa SprinklesKraft ; "Transthoracic echocardiogram showed no embolic source and no right-to-left shunt. This was followed by a transesophageal echocardiogram that was limited due to issues with propofol. However it did not demonstrate any embolic source or right to left shunt. Lipid panel demonstrated a slightly elevated LDL at 115, cholesterol 187, triglycerides 81. Hypocoagulable screen was negative. Hemoglobin A1c 5.6. The patient has had almost complete resolution of his deficit. No longer is having spasms and denies headache.  "

## 2017-02-27 ENCOUNTER — Encounter (HOSPITAL_COMMUNITY)
Admission: RE | Admit: 2017-02-27 | Discharge: 2017-02-27 | Disposition: A | Discharge status: Home / Self Care | Payer: Worker's Compensation | Source: Ambulatory Visit | Attending: Orthopedic Surgery | Admitting: Orthopedic Surgery

## 2017-02-27 ENCOUNTER — Encounter (HOSPITAL_COMMUNITY): Payer: Self-pay

## 2017-02-27 DIAGNOSIS — M6281 Muscle weakness (generalized): Secondary | ICD-10-CM | POA: Insufficient documentation

## 2017-02-27 DIAGNOSIS — E119 Type 2 diabetes mellitus without complications: Secondary | ICD-10-CM | POA: Insufficient documentation

## 2017-02-27 DIAGNOSIS — I6931 Attention and concentration deficit following cerebral infarction: Secondary | ICD-10-CM | POA: Insufficient documentation

## 2017-02-27 DIAGNOSIS — M5412 Radiculopathy, cervical region: Secondary | ICD-10-CM | POA: Insufficient documentation

## 2017-02-27 DIAGNOSIS — M5416 Radiculopathy, lumbar region: Secondary | ICD-10-CM | POA: Insufficient documentation

## 2017-02-27 DIAGNOSIS — E291 Testicular hypofunction: Secondary | ICD-10-CM | POA: Diagnosis not present

## 2017-02-27 DIAGNOSIS — I10 Essential (primary) hypertension: Secondary | ICD-10-CM | POA: Insufficient documentation

## 2017-02-27 DIAGNOSIS — I69351 Hemiplegia and hemiparesis following cerebral infarction affecting right dominant side: Secondary | ICD-10-CM | POA: Insufficient documentation

## 2017-02-27 DIAGNOSIS — I69398 Other sequelae of cerebral infarction: Secondary | ICD-10-CM | POA: Diagnosis not present

## 2017-02-27 DIAGNOSIS — Z01812 Encounter for preprocedural laboratory examination: Secondary | ICD-10-CM | POA: Diagnosis present

## 2017-02-27 HISTORY — DX: Sleep apnea, unspecified: G47.30

## 2017-02-27 HISTORY — DX: Other complications of anesthesia, initial encounter: T88.59XA

## 2017-02-27 HISTORY — DX: Adverse effect of unspecified anesthetic, initial encounter: T41.45XA

## 2017-02-27 HISTORY — DX: Type 2 diabetes mellitus without complications: E11.9

## 2017-02-27 LAB — GLUCOSE, CAPILLARY: GLUCOSE-CAPILLARY: 94 mg/dL (ref 65–99)

## 2017-02-27 LAB — SURGICAL PCR SCREEN
MRSA, PCR: NEGATIVE
Staphylococcus aureus: NEGATIVE

## 2017-02-27 LAB — BASIC METABOLIC PANEL
ANION GAP: 8 (ref 5–15)
BUN: 13 mg/dL (ref 6–20)
CALCIUM: 9.3 mg/dL (ref 8.9–10.3)
CO2: 27 mmol/L (ref 22–32)
Chloride: 104 mmol/L (ref 101–111)
Creatinine, Ser: 0.89 mg/dL (ref 0.61–1.24)
Glucose, Bld: 83 mg/dL (ref 65–99)
Potassium: 4.3 mmol/L (ref 3.5–5.1)
SODIUM: 139 mmol/L (ref 135–145)

## 2017-02-27 LAB — CBC
HEMATOCRIT: 38.7 % — AB (ref 39.0–52.0)
HEMOGLOBIN: 12.8 g/dL — AB (ref 13.0–17.0)
MCH: 29.5 pg (ref 26.0–34.0)
MCHC: 33.1 g/dL (ref 30.0–36.0)
MCV: 89.2 fL (ref 78.0–100.0)
Platelets: 305 10*3/uL (ref 150–400)
RBC: 4.34 MIL/uL (ref 4.22–5.81)
RDW: 13 % (ref 11.5–15.5)
WBC: 6.6 10*3/uL (ref 4.0–10.5)

## 2017-03-01 ENCOUNTER — Other Ambulatory Visit (HOSPITAL_COMMUNITY): Payer: Self-pay

## 2017-03-05 ENCOUNTER — Inpatient Hospital Stay (HOSPITAL_COMMUNITY): Payer: Worker's Compensation

## 2017-03-05 ENCOUNTER — Encounter (HOSPITAL_COMMUNITY)
Admission: RE | Disposition: A | Discharge status: Home / Self Care | Payer: Self-pay | Source: Ambulatory Visit | Attending: Orthopedic Surgery

## 2017-03-05 ENCOUNTER — Encounter (HOSPITAL_COMMUNITY): Payer: Self-pay

## 2017-03-05 ENCOUNTER — Inpatient Hospital Stay (HOSPITAL_COMMUNITY)
Admission: RE | Admit: 2017-03-05 | Discharge: 2017-03-07 | DRG: 470 | Disposition: A | Discharge status: Home / Self Care | Payer: Worker's Compensation | Source: Ambulatory Visit | Attending: Orthopedic Surgery | Admitting: Orthopedic Surgery

## 2017-03-05 ENCOUNTER — Inpatient Hospital Stay (HOSPITAL_COMMUNITY): Payer: Worker's Compensation | Admitting: Certified Registered Nurse Anesthetist

## 2017-03-05 ENCOUNTER — Inpatient Hospital Stay: Admit: 2017-03-05 | Payer: Managed Care, Other (non HMO) | Admitting: Orthopedic Surgery

## 2017-03-05 DIAGNOSIS — I10 Essential (primary) hypertension: Secondary | ICD-10-CM | POA: Diagnosis present

## 2017-03-05 DIAGNOSIS — Z885 Allergy status to narcotic agent status: Secondary | ICD-10-CM | POA: Diagnosis not present

## 2017-03-05 DIAGNOSIS — M25552 Pain in left hip: Secondary | ICD-10-CM

## 2017-03-05 DIAGNOSIS — Z8249 Family history of ischemic heart disease and other diseases of the circulatory system: Secondary | ICD-10-CM

## 2017-03-05 DIAGNOSIS — D62 Acute posthemorrhagic anemia: Secondary | ICD-10-CM | POA: Diagnosis not present

## 2017-03-05 DIAGNOSIS — E785 Hyperlipidemia, unspecified: Secondary | ICD-10-CM | POA: Diagnosis present

## 2017-03-05 DIAGNOSIS — Z833 Family history of diabetes mellitus: Secondary | ICD-10-CM | POA: Diagnosis not present

## 2017-03-05 DIAGNOSIS — G4730 Sleep apnea, unspecified: Secondary | ICD-10-CM | POA: Diagnosis present

## 2017-03-05 DIAGNOSIS — E119 Type 2 diabetes mellitus without complications: Secondary | ICD-10-CM | POA: Diagnosis present

## 2017-03-05 DIAGNOSIS — G8191 Hemiplegia, unspecified affecting right dominant side: Secondary | ICD-10-CM | POA: Diagnosis present

## 2017-03-05 DIAGNOSIS — M1611 Unilateral primary osteoarthritis, right hip: Principal | ICD-10-CM | POA: Diagnosis present

## 2017-03-05 DIAGNOSIS — Z96649 Presence of unspecified artificial hip joint: Secondary | ICD-10-CM

## 2017-03-05 DIAGNOSIS — Z96642 Presence of left artificial hip joint: Secondary | ICD-10-CM | POA: Insufficient documentation

## 2017-03-05 HISTORY — PX: TOTAL HIP ARTHROPLASTY: SHX124

## 2017-03-05 LAB — GLUCOSE, CAPILLARY
GLUCOSE-CAPILLARY: 80 mg/dL (ref 65–99)
Glucose-Capillary: 82 mg/dL (ref 65–99)
Glucose-Capillary: 147 mg/dL — ABNORMAL HIGH (ref 65–99)

## 2017-03-05 LAB — PREPARE RBC (CROSSMATCH)

## 2017-03-05 SURGERY — ARTHROPLASTY, HIP, TOTAL, ANTERIOR APPROACH
Anesthesia: Spinal | Site: Hip | Laterality: Left

## 2017-03-05 MED ORDER — HYDROCODONE-ACETAMINOPHEN 7.5-325 MG PO TABS
1.0000 | ORAL_TABLET | ORAL | Status: DC | PRN
Start: 2017-03-05 — End: 2017-03-07
  Administered 2017-03-05 – 2017-03-06 (×4): 2 via ORAL
  Administered 2017-03-06 (×2): 1 via ORAL
  Administered 2017-03-06 (×2): 2 via ORAL
  Filled 2017-03-05: qty 2
  Filled 2017-03-05: qty 1
  Filled 2017-03-05 (×6): qty 2

## 2017-03-05 MED ORDER — MIDAZOLAM HCL 5 MG/5ML IJ SOLN
INTRAMUSCULAR | Status: DC | PRN
  Administered 2017-03-05: 2 mg via INTRAVENOUS

## 2017-03-05 MED ORDER — PHENYLEPHRINE HCL 10 MG/ML IJ SOLN
INTRAMUSCULAR | Status: DC | PRN
  Administered 2017-03-05: 80 ug via INTRAVENOUS
  Administered 2017-03-05: 40 ug via INTRAVENOUS
  Administered 2017-03-05 (×3): 80 ug via INTRAVENOUS
  Administered 2017-03-05: 40 ug via INTRAVENOUS
  Administered 2017-03-05 (×2): 80 ug via INTRAVENOUS

## 2017-03-05 MED ORDER — LACTATED RINGERS IV SOLN
INTRAVENOUS | Status: DC
  Administered 2017-03-05 (×2): via INTRAVENOUS
  Administered 2017-03-05: 1000 mL via INTRAVENOUS

## 2017-03-05 MED ORDER — DIPHENHYDRAMINE HCL 12.5 MG/5ML PO ELIX: 12.5000 mg | ORAL_SOLUTION | ORAL | Status: DC | PRN

## 2017-03-05 MED ORDER — MENTHOL 3 MG MT LOZG
1.0000 | LOZENGE | OROMUCOSAL | Status: DC | PRN
  Administered 2017-03-05: 3 mg via ORAL
  Filled 2017-03-05: qty 9

## 2017-03-05 MED ORDER — CEFAZOLIN SODIUM-DEXTROSE 2-4 GM/100ML-% IV SOLN
2.0000 g | INTRAVENOUS | Status: AC
  Administered 2017-03-05: 2 g via INTRAVENOUS

## 2017-03-05 MED ORDER — ALBUMIN HUMAN 5 % IV SOLN
INTRAVENOUS | Status: DC | PRN
  Administered 2017-03-05: 10:00:00 via INTRAVENOUS

## 2017-03-05 MED ORDER — LACTATED RINGERS IV SOLN
INTRAVENOUS | Status: DC
  Administered 2017-03-05: 12:00:00 via INTRAVENOUS

## 2017-03-05 MED ORDER — FENTANYL CITRATE (PF) 100 MCG/2ML IJ SOLN
25.0000 ug | INTRAMUSCULAR | Status: DC | PRN
  Administered 2017-03-05: 50 ug via INTRAVENOUS

## 2017-03-05 MED ORDER — ASPIRIN 81 MG PO CHEW
81.0000 mg | CHEWABLE_TABLET | Freq: Two times a day (BID) | ORAL | Status: DC
  Administered 2017-03-05 – 2017-03-07 (×4): 81 mg via ORAL
  Filled 2017-03-05 (×4): qty 1

## 2017-03-05 MED ORDER — FERROUS SULFATE 325 (65 FE) MG PO TABS
325.0000 mg | ORAL_TABLET | Freq: Three times a day (TID) | ORAL | Status: DC
  Administered 2017-03-06 – 2017-03-07 (×4): 325 mg via ORAL
  Filled 2017-03-05 (×4): qty 1

## 2017-03-05 MED ORDER — METOCLOPRAMIDE HCL 5 MG PO TABS
5.0000 mg | ORAL_TABLET | Freq: Three times a day (TID) | ORAL | Status: DC | PRN
Start: 2017-03-05 — End: 2017-03-07

## 2017-03-05 MED ORDER — METHOCARBAMOL 500 MG PO TABS
500.0000 mg | ORAL_TABLET | Freq: Four times a day (QID) | ORAL | Status: DC | PRN
  Administered 2017-03-05 – 2017-03-06 (×2): 500 mg via ORAL
  Filled 2017-03-05 (×2): qty 1

## 2017-03-05 MED ORDER — LIDOCAINE HCL (CARDIAC) 20 MG/ML IV SOLN
INTRAVENOUS | Status: DC | PRN
  Administered 2017-03-05: 50 mg via INTRAVENOUS

## 2017-03-05 MED ORDER — MIDAZOLAM HCL 2 MG/2ML IJ SOLN
INTRAMUSCULAR | Status: AC
  Filled 2017-03-05: qty 2

## 2017-03-05 MED ORDER — MEPERIDINE HCL 50 MG/ML IJ SOLN: 6.2500 mg | INTRAMUSCULAR | Status: DC | PRN

## 2017-03-05 MED ORDER — GLYCOPYRROLATE 0.2 MG/ML IJ SOLN: INTRAMUSCULAR | Status: DC | PRN

## 2017-03-05 MED ORDER — LIDOCAINE 2% (20 MG/ML) 5 ML SYRINGE
INTRAMUSCULAR | Status: AC
  Filled 2017-03-05: qty 5

## 2017-03-05 MED ORDER — ACETAMINOPHEN 325 MG PO TABS
650.0000 mg | ORAL_TABLET | Freq: Four times a day (QID) | ORAL | Status: DC | PRN
  Administered 2017-03-07: 650 mg via ORAL
  Filled 2017-03-05: qty 2

## 2017-03-05 MED ORDER — ACETAMINOPHEN 650 MG RE SUPP: 650.0000 mg | Freq: Four times a day (QID) | RECTAL | Status: DC | PRN

## 2017-03-05 MED ORDER — BUPIVACAINE IN DEXTROSE 0.75-8.25 % IT SOLN
INTRATHECAL | Status: DC | PRN
  Administered 2017-03-05: 1.8 mL via INTRATHECAL

## 2017-03-05 MED ORDER — GLYCOPYRROLATE 0.2 MG/ML IV SOSY
PREFILLED_SYRINGE | INTRAVENOUS | Status: DC | PRN
  Administered 2017-03-05 (×2): .2 mg via INTRAVENOUS

## 2017-03-05 MED ORDER — FENTANYL CITRATE (PF) 100 MCG/2ML IJ SOLN
INTRAMUSCULAR | Status: AC
  Filled 2017-03-05: qty 2

## 2017-03-05 MED ORDER — ONDANSETRON HCL 4 MG PO TABS: 4.0000 mg | ORAL_TABLET | Freq: Four times a day (QID) | ORAL | Status: DC | PRN

## 2017-03-05 MED ORDER — METHOCARBAMOL 1000 MG/10ML IJ SOLN
500.0000 mg | Freq: Four times a day (QID) | INTRAMUSCULAR | Status: DC | PRN
  Administered 2017-03-05: 500 mg via INTRAVENOUS
  Filled 2017-03-05: qty 550

## 2017-03-05 MED ORDER — DEXAMETHASONE SODIUM PHOSPHATE 10 MG/ML IJ SOLN
10.0000 mg | Freq: Once | INTRAMUSCULAR | Status: AC
  Administered 2017-03-05: 10 mg via INTRAVENOUS

## 2017-03-05 MED ORDER — METFORMIN HCL 500 MG PO TABS
1000.0000 mg | ORAL_TABLET | Freq: Two times a day (BID) | ORAL | Status: DC
  Administered 2017-03-06 – 2017-03-07 (×3): 1000 mg via ORAL
  Filled 2017-03-05 (×3): qty 2

## 2017-03-05 MED ORDER — PROPOFOL 10 MG/ML IV BOLUS
INTRAVENOUS | Status: DC | PRN
  Administered 2017-03-05: 20 mg via INTRAVENOUS

## 2017-03-05 MED ORDER — PROPOFOL 10 MG/ML IV BOLUS
INTRAVENOUS | Status: AC
  Filled 2017-03-05: qty 20

## 2017-03-05 MED ORDER — ONDANSETRON HCL 4 MG/2ML IJ SOLN
INTRAMUSCULAR | Status: AC
  Filled 2017-03-05: qty 2

## 2017-03-05 MED ORDER — PROPOFOL 10 MG/ML IV BOLUS
INTRAVENOUS | Status: AC
  Filled 2017-03-05: qty 40

## 2017-03-05 MED ORDER — ONDANSETRON HCL 4 MG/2ML IJ SOLN
INTRAMUSCULAR | Status: DC | PRN
  Administered 2017-03-05: 4 mg via INTRAVENOUS

## 2017-03-05 MED ORDER — ALBUTEROL SULFATE (2.5 MG/3ML) 0.083% IN NEBU
2.5000 mg | INHALATION_SOLUTION | RESPIRATORY_TRACT | Status: DC | PRN
Start: 2017-03-05 — End: 2017-03-07

## 2017-03-05 MED ORDER — PROPOFOL 500 MG/50ML IV EMUL
INTRAVENOUS | Status: DC | PRN
  Administered 2017-03-05: 100 ug/kg/min via INTRAVENOUS

## 2017-03-05 MED ORDER — CHLORHEXIDINE GLUCONATE 4 % EX LIQD: 60.0000 mL | Freq: Once | CUTANEOUS | Status: DC

## 2017-03-05 MED ORDER — TRANEXAMIC ACID 1000 MG/10ML IV SOLN
1000.0000 mg | Freq: Once | INTRAVENOUS | Status: AC
  Administered 2017-03-05: 1000 mg via INTRAVENOUS
  Filled 2017-03-05: qty 1100

## 2017-03-05 MED ORDER — GLYCOPYRROLATE 0.2 MG/ML IV SOSY
PREFILLED_SYRINGE | INTRAVENOUS | Status: AC
  Filled 2017-03-05: qty 5

## 2017-03-05 MED ORDER — ALUM & MAG HYDROXIDE-SIMETH 200-200-20 MG/5ML PO SUSP: 30.0000 mL | ORAL | Status: DC | PRN

## 2017-03-05 MED ORDER — ONDANSETRON 4 MG PO TBDP: 8.0000 mg | ORAL_TABLET | Freq: Every day | ORAL | Status: DC | PRN

## 2017-03-05 MED ORDER — ATORVASTATIN CALCIUM 40 MG PO TABS
40.0000 mg | ORAL_TABLET | Freq: Every evening | ORAL | Status: DC
  Administered 2017-03-06: 40 mg via ORAL
  Filled 2017-03-05: qty 1

## 2017-03-05 MED ORDER — PNEUMOCOCCAL VAC POLYVALENT 25 MCG/0.5ML IJ INJ
0.5000 mL | INJECTION | INTRAMUSCULAR | Status: DC
  Filled 2017-03-05: qty 0.5

## 2017-03-05 MED ORDER — SODIUM CHLORIDE 0.9 % IV SOLN: INTRAVENOUS | Status: DC

## 2017-03-05 MED ORDER — CEFAZOLIN SODIUM-DEXTROSE 2-4 GM/100ML-% IV SOLN
2.0000 g | Freq: Four times a day (QID) | INTRAVENOUS | Status: AC
  Administered 2017-03-05 (×2): 2 g via INTRAVENOUS
  Filled 2017-03-05 (×2): qty 100

## 2017-03-05 MED ORDER — DEXAMETHASONE SODIUM PHOSPHATE 10 MG/ML IJ SOLN
10.0000 mg | Freq: Once | INTRAMUSCULAR | Status: AC
  Administered 2017-03-06: 10 mg via INTRAVENOUS
  Filled 2017-03-05: qty 1

## 2017-03-05 MED ORDER — PHENYLEPHRINE 40 MCG/ML (10ML) SYRINGE FOR IV PUSH (FOR BLOOD PRESSURE SUPPORT)
PREFILLED_SYRINGE | INTRAVENOUS | Status: AC
  Filled 2017-03-05: qty 20

## 2017-03-05 MED ORDER — SODIUM CHLORIDE 0.9 % IR SOLN
Status: DC | PRN
  Administered 2017-03-05: 1000 mL

## 2017-03-05 MED ORDER — DOCUSATE SODIUM 100 MG PO CAPS
100.0000 mg | ORAL_CAPSULE | Freq: Two times a day (BID) | ORAL | Status: DC
  Administered 2017-03-05 – 2017-03-07 (×4): 100 mg via ORAL
  Filled 2017-03-05 (×4): qty 1

## 2017-03-05 MED ORDER — TRANEXAMIC ACID 1000 MG/10ML IV SOLN
1000.0000 mg | INTRAVENOUS | Status: AC
  Administered 2017-03-05: 1000 mg via INTRAVENOUS
  Filled 2017-03-05: qty 1100

## 2017-03-05 MED ORDER — CEFAZOLIN SODIUM-DEXTROSE 2-4 GM/100ML-% IV SOLN
INTRAVENOUS | Status: AC
  Filled 2017-03-05: qty 100

## 2017-03-05 MED ORDER — METOCLOPRAMIDE HCL 5 MG/ML IJ SOLN: 10.0000 mg | Freq: Once | INTRAMUSCULAR | Status: DC | PRN

## 2017-03-05 MED ORDER — METOCLOPRAMIDE HCL 5 MG/ML IJ SOLN: 5.0000 mg | Freq: Three times a day (TID) | INTRAMUSCULAR | Status: DC | PRN

## 2017-03-05 MED ORDER — DEXAMETHASONE SODIUM PHOSPHATE 10 MG/ML IJ SOLN
INTRAMUSCULAR | Status: AC
  Filled 2017-03-05: qty 1

## 2017-03-05 MED ORDER — HYDROMORPHONE HCL-NACL 0.5-0.9 MG/ML-% IV SOSY
0.5000 mg | PREFILLED_SYRINGE | INTRAVENOUS | Status: DC | PRN
  Administered 2017-03-05: 1 mg via INTRAVENOUS
  Filled 2017-03-05: qty 2

## 2017-03-05 MED ORDER — STERILE WATER FOR IRRIGATION IR SOLN
Status: DC | PRN
  Administered 2017-03-05: 2000 mL

## 2017-03-05 MED ORDER — PHENOL 1.4 % MT LIQD: 1.0000 | OROMUCOSAL | Status: DC | PRN

## 2017-03-05 MED ORDER — ONDANSETRON HCL 4 MG/2ML IJ SOLN
4.0000 mg | Freq: Four times a day (QID) | INTRAMUSCULAR | Status: DC | PRN
  Administered 2017-03-05: 4 mg via INTRAVENOUS
  Filled 2017-03-05: qty 2

## 2017-03-05 MED ORDER — POLYETHYLENE GLYCOL 3350 17 G PO PACK: 17.0000 g | PACK | Freq: Every day | ORAL | Status: DC | PRN

## 2017-03-05 SURGICAL SUPPLY — 38 items
BAG DECANTER FOR FLEXI CONT (MISCELLANEOUS) IMPLANT
BAG ZIPLOCK 12X15 (MISCELLANEOUS) ×2 IMPLANT
BLADE SAG 18X100X1.27 (BLADE) ×2 IMPLANT
CAPT HIP TOTAL 2 ×2 IMPLANT
CLOTH BEACON ORANGE TIMEOUT ST (SAFETY) ×2 IMPLANT
COVER PERINEAL POST (MISCELLANEOUS) ×2 IMPLANT
COVER SURGICAL LIGHT HANDLE (MISCELLANEOUS) ×2 IMPLANT
DERMABOND ADVANCED (GAUZE/BANDAGES/DRESSINGS) ×1
DERMABOND ADVANCED .7 DNX12 (GAUZE/BANDAGES/DRESSINGS) ×1 IMPLANT
DRAPE STERI IOBAN 125X83 (DRAPES) ×2 IMPLANT
DRAPE U-SHAPE 47X51 STRL (DRAPES) ×4 IMPLANT
DRESSING AQUACEL AG SP 3.5X10 (GAUZE/BANDAGES/DRESSINGS) ×1 IMPLANT
DRSG AQUACEL AG SP 3.5X10 (GAUZE/BANDAGES/DRESSINGS) ×2
DURAPREP 26ML APPLICATOR (WOUND CARE) ×2 IMPLANT
ELECT REM PT RETURN 15FT ADLT (MISCELLANEOUS) ×2 IMPLANT
GLOVE BIOGEL M STRL SZ7.5 (GLOVE) IMPLANT
GLOVE BIOGEL PI IND STRL 7.5 (GLOVE) ×1 IMPLANT
GLOVE BIOGEL PI IND STRL 8 (GLOVE) ×1 IMPLANT
GLOVE BIOGEL PI IND STRL 8.5 (GLOVE) IMPLANT
GLOVE BIOGEL PI INDICATOR 7.5 (GLOVE) ×1
GLOVE BIOGEL PI INDICATOR 8 (GLOVE) ×1
GLOVE BIOGEL PI INDICATOR 8.5 (GLOVE)
GLOVE ECLIPSE 8.0 STRL XLNG CF (GLOVE) ×4 IMPLANT
GLOVE ORTHO TXT STRL SZ7.5 (GLOVE) ×2 IMPLANT
GOWN STRL REUS W/TWL LRG LVL3 (GOWN DISPOSABLE) ×2 IMPLANT
GOWN STRL REUS W/TWL XL LVL3 (GOWN DISPOSABLE) ×2 IMPLANT
HOLDER FOLEY CATH W/STRAP (MISCELLANEOUS) ×2 IMPLANT
PACK ANTERIOR HIP CUSTOM (KITS) ×2 IMPLANT
SPONGE LAP 18X18 X RAY DECT (DISPOSABLE) ×2 IMPLANT
SUT MNCRL AB 4-0 PS2 18 (SUTURE) ×2 IMPLANT
SUT STRATAFIX 0 PDS 27 VIOLET (SUTURE) ×2
SUT VIC AB 1 CT1 36 (SUTURE) ×6 IMPLANT
SUT VIC AB 2-0 CT1 27 (SUTURE) ×2
SUT VIC AB 2-0 CT1 TAPERPNT 27 (SUTURE) ×2 IMPLANT
SUTURE STRATFX 0 PDS 27 VIOLET (SUTURE) ×1 IMPLANT
TRAY FOLEY W/METER SILVER 16FR (SET/KITS/TRAYS/PACK) ×2 IMPLANT
WATER STERILE IRR 1500ML POUR (IV SOLUTION) ×2 IMPLANT
YANKAUER SUCT BULB TIP 10FT TU (MISCELLANEOUS) ×2 IMPLANT

## 2017-03-05 NOTE — Anesthesia Postprocedure Evaluation (Signed)
Anesthesia Post Note  Patient: Craig Robertson  Procedure(s) Performed: Procedure(s) (LRB): LEFT TOTAL HIP ARTHROPLASTY ANTERIOR APPROACH (Left)     Patient location during evaluation: PACU Anesthesia Type: Spinal Level of consciousness: awake and alert Pain management: pain level controlled Vital Signs Assessment: post-procedure vital signs reviewed and stable Respiratory status: spontaneous breathing and respiratory function stable Cardiovascular status: blood pressure returned to baseline and stable Postop Assessment: no headache, no backache and spinal receding Anesthetic complications: no    Last Vitals:  Vitals:   03/05/17 1254 03/05/17 1325  BP: 125/82 125/81  Pulse: (!) 57 65  Resp: 16 16  Temp: (!) 36.4 C 37 C  SpO2: 100% 100%    Last Pain:  Vitals:   03/05/17 1325  TempSrc: Oral  PainSc:                  Phillips Groutarignan, Aurora Rody

## 2017-03-05 NOTE — Progress Notes (Signed)
Portable AP Pelvis and Lateral Left hip X-rays done.

## 2017-03-05 NOTE — Transfer of Care (Signed)
Immediate Anesthesia Transfer of Care Note  Patient: Craig Robertson  Procedure(s) Performed: Procedure(s) with comments: LEFT TOTAL HIP ARTHROPLASTY ANTERIOR APPROACH (Left) - 70 mins  Patient Location: PACU  Anesthesia Type:Spinal  Level of Consciousness: sedated  Airway & Oxygen Therapy: Patient Spontanous Breathing and Patient connected to face mask oxygen  Post-op Assessment: Report given to RN and Post -op Vital signs reviewed and stable  Post vital signs: Reviewed and stable  Last Vitals:  Vitals:   03/05/17 0611 03/05/17 0634  BP: (!) 166/102 (!) 152/99  Pulse: 63   Resp: 18   Temp: (!) 36.4 C   SpO2: 99%     Last Pain:  Vitals:   03/05/17 0611  TempSrc: Oral      Patients Stated Pain Goal: 4 (03/05/17 [id number])  Complications: No apparent anesthesia complications

## 2017-03-05 NOTE — Evaluation (Signed)
Physical Therapy Evaluation Patient Details Name: Craig Robertson MRN: [id number] DOB: Feb 07, 1966 Today's Date: 03/05/2017   History of Present Illness  s/p L THA  Clinical Impression  Pt is s/p THA resulting in the deficits listed below (see PT Problem List). * Pt will benefit from skilled PT to increase their independence and safety with mobility to allow discharge to the venue listed below.  Pt limited today by dizziness and nausea, amb 11' with RW and min assist, second person for chair---chair to pt to prevent LOC; pt diaphoretic with BP 88/61 see below for further details; will continue to follow in acute setting     Follow Up Recommendations DC plan and follow up therapy as arranged by surgeon    Equipment Recommendations  None recommended by PT    Recommendations for Other Services       Precautions / Restrictions Precautions Precautions: Fall Restrictions Weight Bearing Restrictions: No Other Position/Activity Restrictions: WBAT      Mobility  Bed Mobility Overal bed mobility: Needs Assistance Bed Mobility: Supine to Sit     Supine to sit: +2 for physical assistance;Mod assist     General bed mobility comments: incr time, cues to self assist; assist needed with trunk and LLE  Transfers Overall transfer level: Needs assistance   Transfers: Sit to/from Stand Sit to Stand: Min guard         General transfer comment: cues for hand placement and safety   Ambulation/Gait Ambulation/Gait assistance: Min assist;+2 safety/equipment;+2 physical assistance Ambulation Distance (Feet): 11 Feet Assistive device: Rolling walker (2 wheeled) Gait Pattern/deviations: Step-to pattern;Antalgic;Trunk flexed     General Gait Details: cues for upright trunk, sequence, breathing; pt dizzy and nauseous after above distance--chair to pt, near LOC with HR 48-51, O2 sats 100% on RA BP 88/61--RN aware  Stairs            Wheelchair Mobility    Modified Rankin  (Stroke Patients Only)       Balance                                             Pertinent Vitals/Pain Pain Assessment: 0-10 Pain Score: 3  Pain Location: L hip Pain Descriptors / Indicators: Sore Pain Intervention(s): Limited activity within patient's tolerance;Premedicated before session;Monitored during session    Home Living Family/patient expects to be discharged to:: Private residence Living Arrangements: Spouse/significant other Available Help at Discharge: Family Type of Home: House Home Access: Stairs to enter Entrance Stairs-Rails: None Entrance Stairs-Number of Steps: 3 Home Layout: Multi-level Home Equipment: Environmental consultantWalker - 2 wheels;Bedside commode Additional Comments: plans to sleep down stairs for a few days; master bedroom and  shower are up stairs    Prior Function Level of Independence: Independent               Hand Dominance        Extremity/Trunk Assessment   Upper Extremity Assessment Upper Extremity Assessment: Overall WFL for tasks assessed    Lower Extremity Assessment Lower Extremity Assessment: LLE deficits/detail LLE Deficits / Details: ankle WFL; sensation intact to light touch LLE: Unable to fully assess due to pain       Communication   Communication: No difficulties  Cognition Arousal/Alertness: Awake/alert Behavior During Therapy: WFL for tasks assessed/performed Overall Cognitive Status: Within Functional Limits for tasks assessed  General Comments      Exercises Total Joint Exercises Ankle Circles/Pumps: AROM;Both;5 reps Heel Slides: AAROM;Left;5 reps   Assessment/Plan    PT Assessment Patient needs continued PT services  PT Problem List Decreased balance;Decreased activity tolerance;Decreased mobility;Decreased knowledge of use of DME;Pain;Decreased strength;Decreased range of motion       PT Treatment Interventions DME instruction;Gait  training;Functional mobility training;Therapeutic activities;Therapeutic exercise;Stair training;Patient/family education    PT Goals (Current goals can be found in the Care Plan section)  Acute Rehab PT Goals Patient Stated Goal: none stated PT Goal Formulation: With patient Time For Goal Achievement: 03/09/17 Potential to Achieve Goals: Good    Frequency 7X/week   Barriers to discharge        Co-evaluation               AM-PAC PT "6 Clicks" Daily Activity  Outcome Measure Difficulty turning over in bed (including adjusting bedclothes, sheets and blankets)?: Total Difficulty moving from lying on back to sitting on the side of the bed? : Total Difficulty sitting down on and standing up from a chair with arms (e.g., wheelchair, bedside commode, etc,.)?: A Little Help needed moving to and from a bed to chair (including a wheelchair)?: A Little Help needed walking in hospital room?: A Lot Help needed climbing 3-5 steps with a railing? : A Lot 6 Click Score: 12    End of Session   Activity Tolerance: Treatment limited secondary to medical complications (Comment) (dizziness and nausea) Patient left: in chair;with call bell/phone within reach;with nursing/sitter in room   PT Visit Diagnosis: Difficulty in walking, not elsewhere classified (R26.2);Pain Pain - Right/Left: Left Pain - part of body: Hip    Time: 1650-1710 PT Time Calculation (min) (ACUTE ONLY): 20 min   Charges:   PT Evaluation $PT Eval Low Complexity: 1 Low     PT G CodesDrucilla Chalet, PT Pager: [phone] 03/05/2017   Drucilla Chalet 03/05/2017, 6:02 PM

## 2017-03-05 NOTE — Anesthesia Procedure Notes (Signed)
Date/Time: 03/05/2017 8:34 AM Performed by: Ludwig LeanJONES, Leatrice Parilla C Pre-anesthesia Checklist: Patient identified, Emergency Drugs available, Suction available, Patient being monitored and Timeout performed Patient Re-evaluated:Patient Re-evaluated prior to induction Oxygen Delivery Method: Nasal cannula

## 2017-03-05 NOTE — Interval H&P Note (Signed)
History and Physical Interval Note:  03/05/2017 7:53 AM  Craig Robertson  has presented today for surgery, with the diagnosis of Left hip osteoarthritis  The various methods of treatment have been discussed with the patient and family. After consideration of risks, benefits and other options for treatment, the patient has consented to  Procedure(s) with comments: LEFT TOTAL HIP ARTHROPLASTY ANTERIOR APPROACH (Left) - 70 mins as a surgical intervention .  The patient's history has been reviewed, patient examined, no change in status, stable for surgery.  I have reviewed the patient's chart and labs.  Questions were answered to the patient's satisfaction.     [name],Deontra Pereyra D

## 2017-03-05 NOTE — Progress Notes (Signed)
X-ray results noted 

## 2017-03-05 NOTE — Anesthesia Procedure Notes (Signed)
Spinal  Patient location during procedure: OR Staffing Anesthesiologist: Bedie Dominey Performed: anesthesiologist  Preanesthetic Checklist Completed: patient identified, site marked, surgical consent, pre-op evaluation, timeout performed, IV checked, risks and benefits discussed and monitors and equipment checked Spinal Block Patient position: sitting Prep: Betadine Patient monitoring: heart rate, continuous pulse ox and blood pressure Approach: right paramedian Location: L3-4 Injection technique: single-shot Needle Needle type: Sprotte  Needle gauge: 24 G Needle length: 9 cm Additional Notes Expiration date of kit checked and confirmed. Patient tolerated procedure well, without complications.       

## 2017-03-05 NOTE — Op Note (Signed)
NAME:  Craig Robertson                ACCOUNT NO.: [id number]      MEDICAL RECORD NO.: [id number]      [facility]      PHYSICIAN:  Durene RomansLIN,Kewan Mcnease D  DATE OF BIRTH:  11-23-1965     DATE OF PROCEDURE:  03/05/2017                                 OPERATIVE REPORT         PREOPERATIVE DIAGNOSIS: Left  hip osteoarthritis.      POSTOPERATIVE DIAGNOSIS:  Left hip osteoarthritis.      PROCEDURE:  Left total hip replacement through an anterior approach   utilizing DePuy THR system, component size 56mm pinnacle cup, a size 36+4 neutral   Altrex liner, a size 7 Hi Tri Lock stem with a 36+5 delta ceramic   ball.      SURGEON:  Madlyn FrankelMatthew D. Charlann Boxerlin, M.D.      ASSISTANT:  Leilani AbleSteve Chabon, PA-C     ANESTHESIA:  Spinal.      SPECIMENS:  None.      COMPLICATIONS:  None.      BLOOD LOSS:  1500 cc     DRAINS:  None.      INDICATION OF THE PROCEDURE:  Craig Robertson is a 51 y.o. male who had   presented to office for evaluation of left hip pain.  Radiographs revealed   progressive degenerative changes with bone-on-bone   articulation to the  hip joint.  The patient had painful limited range of   motion significantly affecting their overall quality of life.  The patient was failing to    respond to conservative measures, and at this point was ready   to proceed with more definitive measures.  The patient has noted progressive   degenerative changes in his hip, progressive problems and dysfunction   with regarding the hip prior to surgery.  Consent was obtained for   benefit of pain relief.  Specific risk of infection, DVT, component   failure, dislocation, need for revision surgery, as well discussion of   the anterior versus posterior approach were reviewed.  Consent was   obtained for benefit of anterior pain relief through an anterior   approach.      PROCEDURE IN DETAIL:  The patient was brought to operative theater.   Once adequate anesthesia, preoperative  antibiotics, 2 gm of Ancef, 1 gm of Tranexamic Acid, and 10 mg of Decadron administered.   The patient was positioned supine on the OSI Hanna table.  Once adequate   padding of boney process was carried out, we had predraped out the hip, and  used fluoroscopy to confirm orientation of the pelvis and position.      The left hip was then prepped and draped from proximal iliac crest to   mid thigh with shower curtain technique.      Time-out was performed identifying the patient, planned procedure, and   extremity.     An incision was then made 2 cm distal and lateral to the   anterior superior iliac spine extending over the orientation of the   tensor fascia lata muscle and sharp dissection was carried down to the   fascia of the muscle and protractor placed in the soft tissues.      The fascia  was then incised.  The muscle belly was identified and swept   laterally and retractor placed along the superior neck.  Following   cauterization of the circumflex vessels and removing some pericapsular   fat, a second cobra retractor was placed on the inferior neck.  A third   retractor was placed on the anterior acetabulum after elevating the   anterior rectus.  A L-capsulotomy was along the line of the   superior neck to the trochanteric fossa, then extended proximally and   distally.  Tag sutures were placed and the retractors were then placed   intracapsular.  We then identified the trochanteric fossa and   orientation of my neck cut, confirmed this radiographically   and then made a neck osteotomy with the femur on traction.  The femoral   head was removed without difficulty or complication.  Traction was let   off and retractors were placed posterior and anterior around the   acetabulum.      The labrum and foveal tissue were debrided.  I began reaming with a 48mm   reamer and reamed up to 55mm reamer with good bony bed preparation and a 56mm   cup was chosen.  The final 56mm Pinnacle cup  was then impacted under fluoroscopy  to confirm the depth of penetration and orientation with respect to   abduction.  A screw was placed followed by the hole eliminator.  The final   36+4 Altrex liner was impacted with good visualized rim fit.  The cup was positioned anatomically within the acetabular portion of the pelvis.  Peri-acetabular osteophytes were removed from around the acetabulum.     At this point, the femur was rolled at 80 degrees.  Further capsule was   released off the inferior aspect of the femoral neck.  I then   released the superior capsule proximally.  The hook was placed laterally   along the femur and elevated manually and held in position with the bed   hook.  The leg was then extended and adducted with the leg rolled to 100   degrees of external rotation.  Once the proximal femur was fully   exposed, I used a box osteotome to set orientation.  I then began   broaching with the starting chili pepper broach and passed this by hand and then broached up to 7 (following an initial trial with the 6 and high offset neck trial).  With the 7 broach in place I chose a high offset neck and did a trial reduction.  The offset was appropriate, leg lengths   appeared to be equal best matched with the +5 head ball confirmed radiographically.   Given these findings, I went ahead and dislocated the hip, repositioned all   retractors and positioned the right hip in the extended and abducted position.  The final 7 Hi Tri Lock stem was   chosen and it was impacted down to the level of neck cut.  Based on this   and the trial reduction, a 36+5 delta ceramic ball was chosen and   impacted onto a clean and dry trunnion, and the hip was reduced.  The   hip had been irrigated throughout the case again at this point.  I did   reapproximate the superior capsular leaflet to the anterior leaflet   using #1 Vicryl.  The fascia of the   tensor fascia lata muscle was then reapproximated using #1  Vicryl.  The   remaining wound was  closed with 2-0 Vicryl and running 4-0 Monocryl.   The hip was cleaned, dried, and dressed sterilely using Dermabond and   Aquacel dressing.  He was then brought   to recovery room in stable condition tolerating the procedure well.    Leilani Able, PA-C was present for the entirety of the case involved from   preoperative positioning, perioperative retractor management, general   facilitation of the case, as well as primary wound closure as assistant.            Madlyn Frankel Charlann Boxer, M.D.        03/05/2017 10:15 AM

## 2017-03-05 NOTE — Anesthesia Preprocedure Evaluation (Signed)
Anesthesia Evaluation  Patient identified by MRN, date of birth, ID band Patient awake    Reviewed: Allergy & Precautions, NPO status , Patient's Chart, lab work & pertinent test results  Airway Mallampati: II  TM Distance: >3 FB Neck ROM: Full    Dental no notable dental hx.    Pulmonary sleep apnea and Continuous Positive Airway Pressure Ventilation ,    Pulmonary exam normal breath sounds clear to auscultation       Cardiovascular hypertension, Pt. on medications Normal cardiovascular exam Rhythm:Regular Rate:Normal     Neuro/Psych TIAnegative neurological ROS  negative psych ROS   GI/Hepatic negative GI ROS, Neg liver ROS,   Endo/Other  diabetes, Type 2, Oral Hypoglycemic Agents  Renal/GU negative Renal ROS  negative genitourinary   Musculoskeletal negative musculoskeletal ROS (+)   Abdominal   Peds negative pediatric ROS (+)  Hematology negative hematology ROS (+)   Anesthesia Other Findings   Reproductive/Obstetrics negative OB ROS                             Anesthesia Physical Anesthesia Plan  ASA: III  Anesthesia Plan: Spinal   Post-op Pain Management:    Induction:   PONV Risk Score and Plan: 1 and Ondansetron, Dexamethasone and Treatment may vary due to age or medical condition  Airway Management Planned: Simple Face Mask  Additional Equipment:   Intra-op Plan:   Post-operative Plan:   Informed Consent: I have reviewed the patients History and Physical, chart, labs and discussed the procedure including the risks, benefits and alternatives for the proposed anesthesia with the patient or authorized representative who has indicated his/her understanding and acceptance.   Dental advisory given  Plan Discussed with:   Anesthesia Plan Comments:         Anesthesia Quick Evaluation

## 2017-03-06 LAB — CBC
HEMATOCRIT: 27.9 % — AB (ref 39.0–52.0)
HEMOGLOBIN: 9.8 g/dL — AB (ref 13.0–17.0)
MCH: 30.9 pg (ref 26.0–34.0)
MCHC: 35.1 g/dL (ref 30.0–36.0)
MCV: 88 fL (ref 78.0–100.0)
Platelets: 242 10*3/uL (ref 150–400)
RBC: 3.17 MIL/uL — AB (ref 4.22–5.81)
RDW: 13.2 % (ref 11.5–15.5)
WBC: 13.1 10*3/uL — ABNORMAL HIGH (ref 4.0–10.5)

## 2017-03-06 LAB — BASIC METABOLIC PANEL
Anion gap: 6 (ref 5–15)
BUN: 16 mg/dL (ref 6–20)
CHLORIDE: 101 mmol/L (ref 101–111)
CO2: 29 mmol/L (ref 22–32)
CREATININE: 1.05 mg/dL (ref 0.61–1.24)
Calcium: 8.4 mg/dL — ABNORMAL LOW (ref 8.9–10.3)
GFR calc non Af Amer: >60 mL/min (ref >60)
Glucose, Bld: 171 mg/dL — ABNORMAL HIGH (ref 65–99)
POTASSIUM: 4.3 mmol/L (ref 3.5–5.1)
SODIUM: 136 mmol/L (ref 135–145)

## 2017-03-06 LAB — GLUCOSE, CAPILLARY
GLUCOSE-CAPILLARY: 155 mg/dL — AB (ref 65–99)
GLUCOSE-CAPILLARY: 114 mg/dL — AB (ref 65–99)
GLUCOSE-CAPILLARY: 132 mg/dL — AB (ref 65–99)
GLUCOSE-CAPILLARY: 151 mg/dL — AB (ref 65–99)

## 2017-03-06 MED ORDER — HYDROCODONE-ACETAMINOPHEN 7.5-325 MG PO TABS: 1.0000 | ORAL_TABLET | ORAL | 0 refills | Status: DC | PRN

## 2017-03-06 MED ORDER — INSULIN ASPART 100 UNIT/ML ~~LOC~~ SOLN: 0.0000 [IU] | Freq: Every day | SUBCUTANEOUS | Status: DC

## 2017-03-06 MED ORDER — SODIUM CHLORIDE 0.9 % IV BOLUS (SEPSIS)
500.0000 mL | Freq: Once | INTRAVENOUS | Status: AC
  Administered 2017-03-06: 500 mL via INTRAVENOUS

## 2017-03-06 MED ORDER — POLYETHYLENE GLYCOL 3350 17 G PO PACK: 17.0000 g | PACK | Freq: Every day | ORAL | 0 refills | Status: DC | PRN

## 2017-03-06 MED ORDER — DOCUSATE SODIUM 100 MG PO CAPS: 100.0000 mg | ORAL_CAPSULE | Freq: Two times a day (BID) | ORAL | 0 refills | Status: DC

## 2017-03-06 MED ORDER — FERROUS SULFATE 325 (65 FE) MG PO TABS: 325.0000 mg | ORAL_TABLET | Freq: Three times a day (TID) | ORAL | 0 refills | Status: DC

## 2017-03-06 MED ORDER — INSULIN ASPART 100 UNIT/ML ~~LOC~~ SOLN: 0.0000 [IU] | Freq: Three times a day (TID) | SUBCUTANEOUS | Status: DC

## 2017-03-06 MED ORDER — METHOCARBAMOL 500 MG PO TABS: 500.0000 mg | ORAL_TABLET | Freq: Four times a day (QID) | ORAL | 0 refills | Status: DC | PRN

## 2017-03-06 MED ORDER — ASPIRIN 81 MG PO CHEW: 81.0000 mg | CHEWABLE_TABLET | Freq: Two times a day (BID) | ORAL | 0 refills | Status: DC

## 2017-03-06 NOTE — Progress Notes (Signed)
Patient ID: Craig Robertson, male   DOB: 1966/06/15, 51 y.o.   MRN: [id number] Subjective: 1 Day Post-Op Procedure(s) (LRB): LEFT TOTAL HIP ARTHROPLASTY ANTERIOR APPROACH (Left)    Patient reports pain as moderate.  Weak with PT yesterday most likely related to ABLA from surgery.  No other events reported  Objective:   VITALS:   Vitals:   03/06/17 0249 03/06/17 0525  BP: 118/80 116/60  Pulse: (!) 58 70  Resp: 15 15  Temp: 97.9 F (36.6 C) 97.7 F (36.5 C)  SpO2: 100% 100%    Neurovascular intact Incision: dressing C/D/I  LABS No results for input(s): HGB, HCT, WBC, PLT in the last 72 hours.  No results for input(s): NA, K, BUN, CREATININE, GLUCOSE in the last 72 hours.  No results for input(s): LABPT, INR in the last 72 hours.   Assessment/Plan: 1 Day Post-Op Procedure(s) (LRB): LEFT TOTAL HIP ARTHROPLASTY ANTERIOR APPROACH (Left)   Advance diet Up with therapy   Will increase IVF rate and bolus prior to therpay Labs pending Discharge today versus tomorrow depending on labs, vitals and PT  ABLA - follow  Labs, FeSo4 ordered, bolus as noted

## 2017-03-06 NOTE — Discharge Instructions (Signed)

## 2017-03-06 NOTE — Progress Notes (Signed)
Physical Therapy Treatment Patient Details Name: Craig Robertson MRN: [id number] DOB: 08-16-1965 Today's Date: 03/06/2017    History of Present Illness s/p L THA    PT Comments    Pt continues motivated and progressing with mobility but ltd by c/o increasing dizziness with ambulation - BP 134/84.  Pt hopeful for dc home tomorrow.   Follow Up Recommendations  DC plan and follow up therapy as arranged by surgeon     Equipment Recommendations  None recommended by PT    Recommendations for Other Services OT consult     Precautions / Restrictions Precautions Precautions: Fall Restrictions Weight Bearing Restrictions: No Other Position/Activity Restrictions: WBAT    Mobility  Bed Mobility Overal bed mobility: Needs Assistance Bed Mobility: Supine to Sit     Supine to sit: Min assist     General bed mobility comments: Pt OOB and requests back to chair  Transfers Overall transfer level: Needs assistance Equipment used: Rolling walker (2 wheeled) Transfers: Sit to/from Stand Sit to Stand: Min guard         General transfer comment: cues for hand placement and safety   Ambulation/Gait Ambulation/Gait assistance: Min assist;Min guard Ambulation Distance (Feet): 121 Feet Assistive device: Rolling walker (2 wheeled) Gait Pattern/deviations: Step-to pattern;Decreased step length - right;Decreased step length - left;Shuffle;Trunk flexed Gait velocity: decr Gait velocity interpretation: Below normal speed for age/gender General Gait Details: cues for sequence, posture, ER on L and position from RW; Pt ltd by increasing dizziness   Stairs            Wheelchair Mobility    Modified Rankin (Stroke Patients Only)       Balance Overall balance assessment: No apparent balance deficits (not formally assessed)                                          Cognition Arousal/Alertness: Awake/alert Behavior During Therapy: WFL for tasks  assessed/performed Overall Cognitive Status: Within Functional Limits for tasks assessed                                        Exercises Total Joint Exercises Ankle Circles/Pumps: AROM;Both;20 reps;Supine Quad Sets: AROM;Both;15 reps;Supine Heel Slides: AAROM;20 reps;Supine;Left Hip ABduction/ADduction: AAROM;Left;15 reps;Supine    General Comments        Pertinent Vitals/Pain Pain Assessment: 0-10 Pain Score: 4  Pain Location: L hip Pain Descriptors / Indicators: Sore Pain Intervention(s): Limited activity within patient's tolerance;Monitored during session;Premedicated before session;Ice applied    Home Living                      Prior Function            PT Goals (current goals can now be found in the care plan section) Acute Rehab PT Goals Patient Stated Goal: none stated PT Goal Formulation: With patient Time For Goal Achievement: 03/09/17 Potential to Achieve Goals: Good Progress towards PT goals: Progressing toward goals    Frequency    7X/week      PT Plan Current plan remains appropriate    Co-evaluation              AM-PAC PT "6 Clicks" Daily Activity  Outcome Measure  Difficulty turning over in bed (including adjusting bedclothes, sheets and blankets)?: Total  Difficulty moving from lying on back to sitting on the side of the bed? : Total Difficulty sitting down on and standing up from a chair with arms (e.g., wheelchair, bedside commode, etc,.)?: A Little Help needed moving to and from a bed to chair (including a wheelchair)?: A Little Help needed walking in hospital room?: A Lot Help needed climbing 3-5 steps with a railing? : A Lot 6 Click Score: 10    End of Session Equipment Utilized During Treatment: Gait belt Activity Tolerance: Patient limited by fatigue;Other (comment) Patient left: in chair;with call bell/phone within reach;with family/visitor present Nurse Communication: Other (comment);Mobility  status (dizziness) PT Visit Diagnosis: Difficulty in walking, not elsewhere classified (R26.2) Pain - Right/Left: Left Pain - part of body: Hip     Time: [id number] PT Time Calculation (min) (ACUTE ONLY): 24 min  Charges:  $Gait Training: 23-37 mins $Therapeutic Exercise: 8-22 mins                    G Codes:       Pg [phone]    Estus Krakowski 03/06/2017, 3:15 PM

## 2017-03-06 NOTE — Progress Notes (Signed)
Spoke with patient and spouse at bedside. Plan for OP PT, got DME preoperatively. Requesting a gerichair vs a recliner for home use as he does not think he will be able to make it up the stairs to his bedroom. Contacted [employer] at [phone], she requested I fax the request to them at [phone]. They will arrange delivery to the patient.

## 2017-03-06 NOTE — Progress Notes (Signed)
Physical Therapy Treatment Patient Details Name: Craig GoreSteven D Robertson MRN: [id number] DOB: Apr 18, 1966 Today's Date: 03/06/2017    History of Present Illness s/p L THA    PT Comments    Pt motivated but continues ltd by c/o dizziness with OOB activity.   Follow Up Recommendations  DC plan and follow up therapy as arranged by surgeon     Equipment Recommendations  None recommended by PT    Recommendations for Other Services OT consult     Precautions / Restrictions Precautions Precautions: Fall Restrictions Weight Bearing Restrictions: No Other Position/Activity Restrictions: WBAT    Mobility  Bed Mobility Overal bed mobility: Needs Assistance Bed Mobility: Supine to Sit     Supine to sit: Min assist     General bed mobility comments: increased time with cues for sequence and use of R LE to self assist  Transfers Overall transfer level: Needs assistance Equipment used: Rolling walker (2 wheeled) Transfers: Sit to/from Stand Sit to Stand: Min guard         General transfer comment: cues for hand placement and safety   Ambulation/Gait Ambulation/Gait assistance: Min assist;+2 safety/equipment;+2 physical assistance Ambulation Distance (Feet): 50 Feet Assistive device: Rolling walker (2 wheeled) Gait Pattern/deviations: Step-to pattern;Decreased step length - right;Decreased step length - left;Shuffle;Trunk flexed;Antalgic Gait velocity: decr Gait velocity interpretation: Below normal speed for age/gender General Gait Details: cues for sequence, posture and position from RW; Pt ltd by increasing dizziness   Stairs            Wheelchair Mobility    Modified Rankin (Stroke Patients Only)       Balance                                            Cognition Arousal/Alertness: Awake/alert Behavior During Therapy: WFL for tasks assessed/performed Overall Cognitive Status: Within Functional Limits for tasks assessed                                         Exercises Total Joint Exercises Ankle Circles/Pumps: AROM;Both;20 reps;Supine Quad Sets: AROM;Both;15 reps;Supine Heel Slides: AAROM;20 reps;Supine;Left Hip ABduction/ADduction: AAROM;Left;15 reps;Supine    General Comments        Pertinent Vitals/Pain Pain Assessment: 0-10 Pain Score: 5  Pain Location: L hip Pain Descriptors / Indicators: Sore Pain Intervention(s): Limited activity within patient's tolerance;Monitored during session;Premedicated before session;Ice applied    Home Living                      Prior Function            PT Goals (current goals can now be found in the care plan section) Acute Rehab PT Goals Patient Stated Goal: none stated PT Goal Formulation: With patient Time For Goal Achievement: 03/09/17 Potential to Achieve Goals: Good Progress towards PT goals: Progressing toward goals    Frequency    7X/week      PT Plan Current plan remains appropriate    Co-evaluation              AM-PAC PT "6 Clicks" Daily Activity  Outcome Measure  Difficulty turning over in bed (including adjusting bedclothes, sheets and blankets)?: Total Difficulty moving from lying on back to sitting on the side of the bed? : Total  Difficulty sitting down on and standing up from a chair with arms (e.g., wheelchair, bedside commode, etc,.)?: A Little Help needed moving to and from a bed to chair (including a wheelchair)?: A Little Help needed walking in hospital room?: A Lot Help needed climbing 3-5 steps with a railing? : A Lot 6 Click Score: 12    End of Session Equipment Utilized During Treatment: Gait belt Activity Tolerance: Patient limited by fatigue;Other (comment) (dizziness) Patient left: in chair;with call bell/phone within reach;with family/visitor present Nurse Communication: Mobility status PT Visit Diagnosis: Difficulty in walking, not elsewhere classified (R26.2);Pain Pain - Right/Left:  Left Pain - part of body: Hip     Time: 1610-9604 PT Time Calculation (min) (ACUTE ONLY): 29 min  Charges:  $Gait Training: 8-22 mins $Therapeutic Exercise: 8-22 mins                    G Codes:       Pg [phone]    Nekesha Font 03/06/2017, 1:02 PM

## 2017-03-07 LAB — GLUCOSE, CAPILLARY: Glucose-Capillary: 100 mg/dL — ABNORMAL HIGH (ref 65–99)

## 2017-03-07 LAB — CBC WITH DIFFERENTIAL/PLATELET
Basophils Absolute: 0 10*3/uL (ref 0.0–0.1)
Basophils Relative: 0 %
Eosinophils Absolute: 0 10*3/uL (ref 0.0–0.7)
Eosinophils Relative: 0 %
HCT: 27.7 % — ABNORMAL LOW (ref 39.0–52.0)
Hemoglobin: 9.4 g/dL — ABNORMAL LOW (ref 13.0–17.0)
Lymphocytes Relative: 14 %
Lymphs Abs: 2.4 10*3/uL (ref 0.7–4.0)
MCH: 30.1 pg (ref 26.0–34.0)
MCHC: 33.9 g/dL (ref 30.0–36.0)
MCV: 88.8 fL (ref 78.0–100.0)
Monocytes Absolute: 1.3 10*3/uL — ABNORMAL HIGH (ref 0.1–1.0)
Monocytes Relative: 8 %
Neutro Abs: 14 10*3/uL — ABNORMAL HIGH (ref 1.7–7.7)
Neutrophils Relative %: 78 %
Platelets: 229 10*3/uL (ref 150–400)
RBC: 3.12 MIL/uL — ABNORMAL LOW (ref 4.22–5.81)
RDW: 13.5 % (ref 11.5–15.5)
WBC: 17.7 10*3/uL — ABNORMAL HIGH (ref 4.0–10.5)

## 2017-03-07 NOTE — Progress Notes (Signed)
Patient ID: Craig Robertson, male   DOB: May 12, 1966, 51 y.o.   MRN: [id number] Subjective: 2 Days Post-Op Procedure(s) (LRB): LEFT TOTAL HIP ARTHROPLASTY ANTERIOR APPROACH (Left)    Patient reports pain as moderate.  Did well with therapy yesterday.  Ready to go home today  Objective:   VITALS:   Vitals:   03/06/17 2155 03/07/17 0649  BP: 128/77 140/70  Pulse: 82 75  Resp: 16 16  Temp: 98.2 F (36.8 C) (!) 97.5 F (36.4 C)  SpO2: 99% 99%    Neurovascular intact Incision: dressing C/D/I  LABS  Recent Labs  03/06/17 0909  HGB 9.8*  HCT 27.9*  WBC 13.1*  PLT 242     Recent Labs  03/06/17 0909  NA 136  K 4.3  BUN 16  CREATININE 1.05  GLUCOSE 171*    No results for input(s): LABPT, INR in the last 72 hours.   Assessment/Plan: 2 Days Post-Op Procedure(s) (LRB): LEFT TOTAL HIP ARTHROPLASTY ANTERIOR APPROACH (Left)   Advance diet Up with therapy  Will check a follow up CBC this am  But plan for D/C to home this am Vitals remain stable despite initial ABLA ABLA - observe, treat with IRON at discharge

## 2017-03-07 NOTE — Progress Notes (Signed)
Discharge instructions were reviewed with the patient and all questions were answered. Nurse Tech wheeled the patient with his belongings to his vehicle.

## 2017-03-07 NOTE — Progress Notes (Signed)
Physical Therapy Treatment Patient Details Name: Craig GoreSteven D Robertson MRN: [id number] DOB: Dec 04, 1965 Today's Date: 03/07/2017    History of Present Illness s/p L THA    PT Comments    Pt progressing well with mobility.  Reviewed stairs and car transfers with pt and spouse.  Follow Up Recommendations  DC plan and follow up therapy as arranged by surgeon     Equipment Recommendations  None recommended by PT    Recommendations for Other Services OT consult     Precautions / Restrictions Precautions Precautions: Fall Restrictions Weight Bearing Restrictions: No Other Position/Activity Restrictions: WBAT    Mobility  Bed Mobility Overal bed mobility: Needs Assistance Bed Mobility: Supine to Sit;Sit to Supine     Supine to sit: Min guard;Supervision Sit to supine: Min assist;Min guard   General bed mobility comments: Pt in/out bed and educated on use of sheet/leg lifter to self assist.  Transfers Overall transfer level: Needs assistance Equipment used: Rolling walker (2 wheeled) Transfers: Sit to/from Stand Sit to Stand: Supervision         General transfer comment: cues for hand placement and safety   Ambulation/Gait Ambulation/Gait assistance: Min guard;Supervision Ambulation Distance (Feet): 100 Feet Assistive device: Rolling walker (2 wheeled) Gait Pattern/deviations: Step-to pattern;Decreased step length - right;Decreased step length - left;Shuffle;Trunk flexed Gait velocity: decr Gait velocity interpretation: Below normal speed for age/gender General Gait Details: cues for sequence, posture, ER on L and position from RW   Stairs Stairs: Yes   Stair Management: No rails;Step to pattern;Backwards;With walker Number of Stairs: 4 General stair comments: 2 stairs twice with spouse assisting on second attempt.  Pt's spouse assisting on second attempt.  Also reviewed stairs with rail and cane for future move upstairs to bedroom.  Pt declines to attempt at this  time.  Wheelchair Mobility    Modified Rankin (Stroke Patients Only)       Balance Overall balance assessment: No apparent balance deficits (not formally assessed)                                          Cognition Arousal/Alertness: Awake/alert Behavior During Therapy: WFL for tasks assessed/performed Overall Cognitive Status: Within Functional Limits for tasks assessed                                        Exercises Total Joint Exercises Ankle Circles/Pumps: AROM;Both;20 reps;Supine Quad Sets: AROM;Both;Supine;10 reps Short Arc Quad: AROM;10 reps;Seated;Left Heel Slides: AAROM;20 reps;Supine;Left Hip ABduction/ADduction: AAROM;Left;15 reps;Supine    General Comments        Pertinent Vitals/Pain Pain Assessment: 0-10 Pain Score: 5  Pain Location: L hip Pain Descriptors / Indicators: Sore Pain Intervention(s): Limited activity within patient's tolerance;Monitored during session;Premedicated before session;Ice applied    Home Living                      Prior Function            PT Goals (current goals can now be found in the care plan section) Acute Rehab PT Goals Patient Stated Goal: none stated PT Goal Formulation: With patient Time For Goal Achievement: 03/09/17 Potential to Achieve Goals: Good Progress towards PT goals: Progressing toward goals    Frequency    7X/week  PT Plan Current plan remains appropriate    Co-evaluation              AM-PAC PT "6 Clicks" Daily Activity  Outcome Measure  Difficulty turning over in bed (including adjusting bedclothes, sheets and blankets)?: Total Difficulty moving from lying on back to sitting on the side of the bed? : A Lot Difficulty sitting down on and standing up from a chair with arms (e.g., wheelchair, bedside commode, etc,.)?: A Little Help needed moving to and from a bed to chair (including a wheelchair)?: A Little Help needed walking in  hospital room?: A Little Help needed climbing 3-5 steps with a railing? : A Little 6 Click Score: 15    End of Session Equipment Utilized During Treatment: Gait belt Activity Tolerance: Patient tolerated treatment well Patient left: with call bell/phone within reach;in chair;with family/visitor present Nurse Communication: Mobility status PT Visit Diagnosis: Difficulty in walking, not elsewhere classified (R26.2)     Time: 1610-9604 PT Time Calculation (min) (ACUTE ONLY): 25 min  Charges:  $Gait Training: 23-37 mins $Therapeutic Exercise: 8-22 mins $Therapeutic Activity: 8-22 mins                    G Codes:       Pg [phone]    Mattia Liford 03/07/2017, 11:01 AM

## 2017-03-07 NOTE — Discharge Summary (Signed)
Physician Discharge Summary   Patient ID: Craig Robertson MRN: [id number] DOB/AGE: Nov 28, 1965 51 y.o.  Admit date: 03/05/2017 Discharge date: 03/07/2017  Primary Diagnosis: Primary osteoarthritis left hip  Admission Diagnoses:  Past Medical History:  Diagnosis Date  . Arthritis   . Complication of anesthesia    "slow to wake"  . Diabetes mellitus without complication ([place])   . Dyspnea    allegy season   . ED (erectile dysfunction)   . History of hiatal hernia   . Hyperlipidemia   . Hypertension   . Low testosterone   . Sleep apnea    on CPAP   . Stroke [facility]) 2015   TIA    Discharge Diagnoses:   Active Problems:   Status post total hip replacement, left  Estimated body mass index is 35.13 kg/m as calculated from the following:   Height as of this encounter: 6' (1.829 m).   Weight as of this encounter: 117.5 kg (259 lb).  Procedure(s) (LRB): LEFT TOTAL HIP ARTHROPLASTY ANTERIOR APPROACH (Left)   Consults: None  HPI: Craig Robertson, 51 y.o. male, has a history of pain and functional disability in the left hip(s) due to arthritis and patient has failed non-surgical conservative treatments for greater than 12 weeks to include NSAID's and/or analgesics, corticosteriod injections and activity modification.  Onset of symptoms was gradual starting ~1 years ago with gradually worsening course since that time.The patient noted no past surgery on the left hip(s).  Patient currently rates pain in the left hip at 8 out of 10 with activity. Patient has night pain, worsening of pain with activity and weight bearing, trendelenberg gait, pain that interfers with activities of daily living and pain with passive range of motion. Patient has evidence of periarticular osteophytes and joint space narrowing by imaging studies. This condition presents safety issues increasing the risk of falls. There is no current active infection.  Risks, benefits and expectations were discussed with the  patient.  Risks including but not limited to the risk of anesthesia, blood clots, nerve damage, blood vessel damage, failure of the prosthesis, infection and up to and including death.  Patient understand the risks, benefits and expectations and wishes to proceed with surgery.   Laboratory Data: Admission on 03/05/2017, Discharged on 03/07/2017  Component Date Value Ref Range Status  . Glucose-Capillary 03/05/2017 80  65 - 99 mg/dL Final  . Order Confirmation 03/05/2017 ORDER PROCESSED BY BLOOD BANK   Final  . Glucose-Capillary 03/05/2017 82  65 - 99 mg/dL Final  . Comment 1 03/05/2017 Notify RN   Final  . Comment 2 03/05/2017 Document in Chart   Final  . Glucose-Capillary 03/05/2017 147* 65 - 99 mg/dL Final  . Comment 1 03/05/2017 Document in Chart   Final  . WBC 03/06/2017 13.1* 4.0 - 10.5 K/uL Final  . RBC 03/06/2017 3.17* 4.22 - 5.81 MIL/uL Final  . Hemoglobin 03/06/2017 9.8* 13.0 - 17.0 g/dL Final  . HCT 03/06/2017 27.9* 39.0 - 52.0 % Final  . MCV 03/06/2017 88.0  78.0 - 100.0 fL Final  . MCH 03/06/2017 30.9  26.0 - 34.0 pg Final  . MCHC 03/06/2017 35.1  30.0 - 36.0 g/dL Final  . RDW 03/06/2017 13.2  11.5 - 15.5 % Final  . Platelets 03/06/2017 242  150 - 400 K/uL Final  . Sodium 03/06/2017 136  135 - 145 mmol/L Final  . Potassium 03/06/2017 4.3  3.5 - 5.1 mmol/L Final  . Chloride 03/06/2017 101  101 - 111  mmol/L Final  . CO2 03/06/2017 29  22 - 32 mmol/L Final  . Glucose, Bld 03/06/2017 171* 65 - 99 mg/dL Final  . BUN 03/06/2017 16  6 - 20 mg/dL Final  . Creatinine, Ser 03/06/2017 1.05  0.61 - 1.24 mg/dL Final  . Calcium 03/06/2017 8.4* 8.9 - 10.3 mg/dL Final  . GFR calc non Af Amer 03/06/2017 >60  >60 mL/min Final  . GFR calc Af Amer 03/06/2017 >60  >60 mL/min Final   Comment: (NOTE) The eGFR has been calculated using the CKD EPI equation. This calculation has not been validated in all clinical situations. eGFR's persistently <60 mL/min signify possible Chronic  Kidney Disease.   . Anion gap 03/06/2017 6  5 - 15 Final  . Glucose-Capillary 03/06/2017 155* 65 - 99 mg/dL Final  . Glucose-Capillary 03/06/2017 114* 65 - 99 mg/dL Final  . Glucose-Capillary 03/06/2017 132* 65 - 99 mg/dL Final  . Glucose-Capillary 03/06/2017 151* 65 - 99 mg/dL Final  . Glucose-Capillary 03/07/2017 100* 65 - 99 mg/dL Final  . WBC 03/07/2017 17.7* 4.0 - 10.5 K/uL Final  . RBC 03/07/2017 3.12* 4.22 - 5.81 MIL/uL Final  . Hemoglobin 03/07/2017 9.4* 13.0 - 17.0 g/dL Final  . HCT 03/07/2017 27.7* 39.0 - 52.0 % Final  . MCV 03/07/2017 88.8  78.0 - 100.0 fL Final  . MCH 03/07/2017 30.1  26.0 - 34.0 pg Final  . MCHC 03/07/2017 33.9  30.0 - 36.0 g/dL Final  . RDW 03/07/2017 13.5  11.5 - 15.5 % Final  . Platelets 03/07/2017 229  150 - 400 K/uL Final  . Neutrophils Relative % 03/07/2017 78  % Final  . Neutro Abs 03/07/2017 14.0* 1.7 - 7.7 K/uL Final  . Lymphocytes Relative 03/07/2017 14  % Final  . Lymphs Abs 03/07/2017 2.4  0.7 - 4.0 K/uL Final  . Monocytes Relative 03/07/2017 8  % Final  . Monocytes Absolute 03/07/2017 1.3* 0.1 - 1.0 K/uL Final  . Eosinophils Relative 03/07/2017 0  % Final  . Eosinophils Absolute 03/07/2017 0.0  0.0 - 0.7 K/uL Final  . Basophils Relative 03/07/2017 0  % Final  . Basophils Absolute 03/07/2017 0.0  0.0 - 0.1 K/uL Final  Hospital Outpatient Visit on 02/27/2017  Component Date Value Ref Range Status  . Glucose-Capillary 02/27/2017 94  65 - 99 mg/dL Final  . WBC 02/27/2017 6.6  4.0 - 10.5 K/uL Final  . RBC 02/27/2017 4.34  4.22 - 5.81 MIL/uL Final  . Hemoglobin 02/27/2017 12.8* 13.0 - 17.0 g/dL Final  . HCT 02/27/2017 38.7* 39.0 - 52.0 % Final  . MCV 02/27/2017 89.2  78.0 - 100.0 fL Final  . MCH 02/27/2017 29.5  26.0 - 34.0 pg Final  . MCHC 02/27/2017 33.1  30.0 - 36.0 g/dL Final  . RDW 02/27/2017 13.0  11.5 - 15.5 % Final  . Platelets 02/27/2017 305  150 - 400 K/uL Final  . Sodium 02/27/2017 139  135 - 145 mmol/L Final  . Potassium  02/27/2017 4.3  3.5 - 5.1 mmol/L Final  . Chloride 02/27/2017 104  101 - 111 mmol/L Final  . CO2 02/27/2017 27  22 - 32 mmol/L Final  . Glucose, Bld 02/27/2017 83  65 - 99 mg/dL Final  . BUN 02/27/2017 13  6 - 20 mg/dL Final  . Creatinine, Ser 02/27/2017 0.89  0.61 - 1.24 mg/dL Final  . Calcium 02/27/2017 9.3  8.9 - 10.3 mg/dL Final  . GFR calc non Af Amer 02/27/2017 >60  >60  mL/min Final  . GFR calc Af Amer 02/27/2017 >60  >60 mL/min Final   Comment: (NOTE) The eGFR has been calculated using the CKD EPI equation. This calculation has not been validated in all clinical situations. eGFR's persistently <60 mL/min signify possible Chronic Kidney Disease.   . Anion gap 02/27/2017 8  5 - 15 Final  . ABO/RH(D) 02/27/2017 B POS   Final  . Antibody Screen 02/27/2017 NEG   Final  . Sample Expiration 02/27/2017 03/08/2017   Final  . Extend sample reason 02/27/2017 NO TRANSFUSIONS OR PREGNANCY IN THE PAST 3 MONTHS   Final  . Unit Number 02/27/2017 [id number]   Final  . Blood Component Type 02/27/2017 RED CELLS,LR   Final  . Unit division 02/27/2017 00   Final  . Status of Unit 02/27/2017 ALLOCATED   Final  . Transfusion Status 02/27/2017 OK TO TRANSFUSE   Final  . Crossmatch Result 02/27/2017 Compatible   Final  . Unit Number 02/27/2017 [id number]   Final  . Blood Component Type 02/27/2017 RED CELLS,LR   Final  . Unit division 02/27/2017 00   Final  . Status of Unit 02/27/2017 ALLOCATED   Final  . Transfusion Status 02/27/2017 OK TO TRANSFUSE   Final  . Crossmatch Result 02/27/2017 Compatible   Final  . MRSA, PCR 02/27/2017 NEGATIVE  NEGATIVE Final  . Staphylococcus aureus 02/27/2017 NEGATIVE  NEGATIVE Final   Comment:        The Xpert SA Assay (FDA approved for NASAL specimens in patients over 79 years of age), is one component of a comprehensive surveillance program.  Test performance has been validated by [facility] for patients greater than or equal to 7 year old. It is  not intended to diagnose infection nor to guide or monitor treatment.   . Blood Product Unit Number 02/27/2017 [id number]   Final  . Unit Type and Rh 02/27/2017 7300   Final  . Blood Product Expiration Date 02/27/2017 [id number]   Final  . Blood Product Unit Number 02/27/2017 [id number]   Final  . Unit Type and Rh 02/27/2017 7300   Final  . Blood Product Expiration Date 02/27/2017 [id number]   Final  Office Visit on 02/08/2017  Component Date Value Ref Range Status  . Hemoglobin A1C 02/08/2017 7.2   Final  Office Visit on 01/16/2017  Component Date Value Ref Range Status  . Hemoglobin A1C 01/16/2017 7.0   Final  Admission on 01/15/2017, Discharged on 01/15/2017  Component Date Value Ref Range Status  . Glucose-Capillary 01/15/2017 160* 65 - 99 mg/dL Final  . Comment 1 01/15/2017 Notify RN   Final  Hospital Outpatient Visit on 01/11/2017  Component Date Value Ref Range Status  . Sodium 01/11/2017 136  135 - 145 mmol/L Final  . Potassium 01/11/2017 4.5  3.5 - 5.1 mmol/L Final  . Chloride 01/11/2017 100* 101 - 111 mmol/L Final  . CO2 01/11/2017 30  22 - 32 mmol/L Final  . Glucose, Bld 01/11/2017 352* 65 - 99 mg/dL Final  . BUN 01/11/2017 12  6 - 20 mg/dL Final  . Creatinine, Ser 01/11/2017 0.95  0.61 - 1.24 mg/dL Final  . Calcium 01/11/2017 9.3  8.9 - 10.3 mg/dL Final  . GFR calc non Af Amer 01/11/2017 >60  >60 mL/min Final  . GFR calc Af Amer 01/11/2017 >60  >60 mL/min Final   Comment: (NOTE) The eGFR has been calculated using the CKD EPI equation. This calculation has not been validated in all clinical situations.  eGFR's persistently <60 mL/min signify possible Chronic Kidney Disease.   . Anion gap 01/11/2017 6  5 - 15 Final  . WBC 01/11/2017 6.8  4.0 - 10.5 K/uL Final  . RBC 01/11/2017 4.31  4.22 - 5.81 MIL/uL Final  . Hemoglobin 01/11/2017 12.9* 13.0 - 17.0 g/dL Final  . HCT 01/11/2017 38.3* 39.0 - 52.0 % Final  . MCV 01/11/2017 88.9  78.0 - 100.0 fL Final  .  MCH 01/11/2017 29.9  26.0 - 34.0 pg Final  . MCHC 01/11/2017 33.7  30.0 - 36.0 g/dL Final  . RDW 01/11/2017 12.8  11.5 - 15.5 % Final  . Platelets 01/11/2017 290  150 - 400 K/uL Final  . ABO/RH(D) 01/11/2017 B POS   Final  . Antibody Screen 01/11/2017 NEG   Final  . Sample Expiration 01/11/2017 01/18/2017   Final  . Extend sample reason 01/11/2017 NO TRANSFUSIONS OR PREGNANCY IN THE PAST 3 MONTHS   Final  . MRSA, PCR 01/11/2017 NEGATIVE  NEGATIVE Final  . Staphylococcus aureus 01/11/2017 NEGATIVE  NEGATIVE Final   Comment:        The Xpert SA Assay (FDA approved for NASAL specimens in patients over 55 years of age), is one component of a comprehensive surveillance program.  Test performance has been validated by [facility] for patients greater than or equal to 23 year old. It is not intended to diagnose infection nor to guide or monitor treatment.   . ABO/RH(D) 01/11/2017 B POS   Final     X-Rays:Dg C-arm 61-120 Min-no Report  Result Date: 03/05/2017 Fluoroscopy was utilized by the requesting physician.  No radiographic interpretation.   Dg Hip Port Unilat With Pelvis 1v Left  Result Date: 03/05/2017 CLINICAL DATA:  Post- operative left total hip joint prosthesis placement using anterior approach EXAM: DG HIP (WITH OR WITHOUT PELVIS) 1V PORT LEFT COMPARISON:  Fluoro spot radiographs of earlier today. FINDINGS: The patient has undergone left total hip joint prosthesis placement. Radiographic positioning of the prosthetic components is good. The interface with the native bone appears normal. IMPRESSION: No immediate postprocedure complication following anterior approach left hip joint prosthesis placement. Electronically Signed   By: David  Martinique M.D.   On: 03/05/2017 11:32    EKG: Orders placed or performed during the hospital encounter of 01/11/17  . EKG 12 lead  . EKG 12 lead     Hospital Course: Patient was admitted to [facility] and taken to the OR and  underwent the above state procedure without complications.  Patient tolerated the procedure well and was later transferred to the recovery room and then to the orthopaedic floor for postoperative care.  They were given PO and IV analgesics for pain control following their surgery.  They were given 24 hours of postoperative antibiotics of  Anti-infectives    Start     Dose/Rate Route Frequency Ordered Stop   03/05/17 1400  ceFAZolin (ANCEF) IVPB 2g/100 mL premix     2 g 200 mL/hr over 30 Minutes Intravenous Every 6 hours 03/05/17 1236 03/05/17 1958   03/05/17 0634  ceFAZolin (ANCEF) 2-4 GM/100ML-% IVPB    Comments:  [place] Session   : cabinet override      03/05/17 [phone] 03/05/17 0853   03/05/17 0629  ceFAZolin (ANCEF) IVPB 2g/100 mL premix     2 g 200 mL/hr over 30 Minutes Intravenous On call to O.R. 03/05/17 1194 03/05/17 0853     and started on DVT prophylaxis in the form of  Aspirin.   PT and OT were ordered for total hip protocol.  The patient was allowed to be WBAT with therapy. Discharge planning was consulted to help with postop disposition and equipment needs.  Patient had a fair night on the evening of surgery.  They started to get up OOB with therapy on day one.  He had some issues with volume depletion and subsequent hypotension. He responded to fluid supplementation and Hgb remained stable into post op day two.  The patient had progressed with therapy and meeting their goals.  Incision was healing well.  Patient was seen in rounds and was ready to go home.   Diet: Cardiac diet and carb modified diet Activity:WBAT No bending hip over 90 degrees- A "L" Angle Do not cross legs Do not let foot roll inward When turning these patients a pillow should be placed between the patient's legs to prevent crossing. Patients should have the affected knee fully extended when trying to sit or stand from all surfaces to prevent excessive hip flexion. When ambulating and turning toward the affected  side the affected leg should have the toes turned out prior to moving the walker and the rest of patient's body as to prevent internal rotation/ turning in of the leg. Abduction pillows are the most effective way to prevent a patient from not crossing legs or turning toes in at rest. If an abduction pillow is not ordered placing a regular pillow length wise between the patient's legs is also an effective reminder. It is imperative that these precautions be maintained so that the surgical hip does not dislocate. Follow-up:in 2 weeks Disposition - Home Discharged Condition: stable   Discharge Instructions    Call MD / Call 911    Complete by:  As directed    If you experience chest pain or shortness of breath, CALL 911 and be transported to the hospital emergency room.  If you develope a fever above 101 F, pus (white drainage) or increased drainage or redness at the wound, or calf pain, call your surgeon's office.   Constipation Prevention    Complete by:  As directed    Drink plenty of fluids.  Prune juice may be helpful.  You may use a stool softener, such as Colace (over the counter) 100 mg twice a day.  Use MiraLax (over the counter) for constipation as needed.   Diet - low sodium heart healthy    Complete by:  As directed    Diet Carb Modified    Complete by:  As directed    Discharge instructions    Complete by:  As directed    INSTRUCTIONS AFTER JOINT REPLACEMENT   Remove items at home which could result in a fall. This includes throw rugs or furniture in walking pathways ICE to the affected joint every three hours while awake for 30 minutes at a time, for at least the first 3-5 days, and then as needed for pain and swelling.  Continue to use ice for pain and swelling. You may notice swelling that will progress down to the foot and ankle.  This is normal after surgery.  Elevate your leg when you are not up walking on it.   Continue to use the breathing machine you got in the hospital  (incentive spirometer) which will help keep your temperature down.  It is common for your temperature to cycle up and down following surgery, especially at night when you are not up moving around and exerting yourself.  The  breathing machine keeps your lungs expanded and your temperature down.   DIET:  As you were doing prior to hospitalization, we recommend a well-balanced diet.  DRESSING / WOUND CARE / SHOWERING  Keep the surgical dressing until follow up.  The dressing is water proof, so you can shower without any extra covering.  IF THE DRESSING FALLS OFF or the wound gets wet inside, change the dressing with sterile gauze.  Please use good hand washing techniques before changing the dressing.  Do not use any lotions or creams on the incision until instructed by your surgeon.    ACTIVITY  Increase activity slowly as tolerated, but follow the weight bearing instructions below.   No driving for 6 weeks or until further direction given by your physician.  You cannot drive while taking narcotics.  No lifting or carrying greater than 10 lbs. until further directed by your surgeon. Avoid periods of inactivity such as sitting longer than an hour when not asleep. This helps prevent blood clots.  You may return to work once you are authorized by your doctor.     WEIGHT BEARING   Weight bearing as tolerated with assist device (walker, cane, etc) as directed, use it as long as suggested by your surgeon or therapist, typically at least 4-6 weeks.   EXERCISES  Results after joint replacement surgery are often greatly improved when you follow the exercise, range of motion and muscle strengthening exercises prescribed by your doctor. Safety measures are also important to protect the joint from further injury. Any time any of these exercises cause you to have increased pain or swelling, decrease what you are doing until you are comfortable again and then slowly increase them. If you have problems or  questions, call your caregiver or physical therapist for advice.   Rehabilitation is important following a joint replacement. After just a few days of immobilization, the muscles of the leg can become weakened and shrink (atrophy).  These exercises are designed to build up the tone and strength of the thigh and leg muscles and to improve motion. Often times heat used for twenty to thirty minutes before working out will loosen up your tissues and help with improving the range of motion but do not use heat for the first two weeks following surgery (sometimes heat can increase post-operative swelling).   These exercises can be done on a training (exercise) mat, on the floor, on a table or on a bed. Use whatever works the best and is most comfortable for you.    Use music or television while you are exercising so that the exercises are a pleasant break in your day. This will make your life better with the exercises acting as a break in your routine that you can look forward to.   Perform all exercises about fifteen times, three times per day or as directed.  You should exercise both the operative leg and the other leg as well.  Exercises include:   Quad Sets - Tighten up the muscle on the front of the thigh (Quad) and hold for 5-10 seconds.   Straight Leg Raises - With your knee straight (if you were given a brace, keep it on), lift the leg to 60 degrees, hold for 3 seconds, and slowly lower the leg.  Perform this exercise against resistance later as your leg gets stronger.  Leg Slides: Lying on your back, slowly slide your foot toward your buttocks, bending your knee up off the floor (only go as far  as is comfortable). Then slowly slide your foot back down until your leg is flat on the floor again.  Angel Wings: Lying on your back spread your legs to the side as far apart as you can without causing discomfort.  Hamstring Strength:  Lying on your back, push your heel against the floor with your leg straight  by tightening up the muscles of your buttocks.  Repeat, but this time bend your knee to a comfortable angle, and push your heel against the floor.  You may put a pillow under the heel to make it more comfortable if necessary.   A rehabilitation program following joint replacement surgery can speed recovery and prevent re-injury in the future due to weakened muscles. Contact your doctor or a physical therapist for more information on knee rehabilitation.    CONSTIPATION  Constipation is defined medically as fewer than three stools per week and severe constipation as less than one stool per week.  Even if you have a regular bowel pattern at home, your normal regimen is likely to be disrupted due to multiple reasons following surgery.  Combination of anesthesia, postoperative narcotics, change in appetite and fluid intake all can affect your bowels.   YOU MUST use at least one of the following options; they are listed in order of increasing strength to get the job done.  They are all available over the counter, and you may need to use some, POSSIBLY even all of these options:    Drink plenty of fluids (prune juice may be helpful) and high fiber foods Colace 100 mg by mouth twice a day  Senokot for constipation as directed and as needed Dulcolax (bisacodyl), take with full glass of water  Miralax (polyethylene glycol) once or twice a day as needed.  If you have tried all these things and are unable to have a bowel movement in the first 3-4 days after surgery call either your surgeon or your primary doctor.    If you experience loose stools or diarrhea, hold the medications until you stool forms back up.  If your symptoms do not get better within 1 week or if they get worse, check with your doctor.  If you experience "the worst abdominal pain ever" or develop nausea or vomiting, please contact the office immediately for further recommendations for treatment.   ITCHING:  If you experience itching with  your medications, try taking only a single pain pill, or even half a pain pill at a time.  You can also use Benadryl over the counter for itching or also to help with sleep.   TED HOSE STOCKINGS:  Use stockings on both legs until for at least 2 weeks or as directed by physician office. They may be removed at night for sleeping.  MEDICATIONS:  See your medication summary on the "After Visit Summary" that nursing will review with you.  You may have some home medications which will be placed on hold until you complete the course of blood thinner medication.  It is important for you to complete the blood thinner medication as prescribed.  PRECAUTIONS:  If you experience chest pain or shortness of breath - call 911 immediately for transfer to the hospital emergency department.   If you develop a fever greater that 101 F, purulent drainage from wound, increased redness or drainage from wound, foul odor from the wound/dressing, or calf pain - CONTACT YOUR SURGEON.  FOLLOW-UP APPOINTMENTS:  If you do not already have a post-op appointment, please call the office for an appointment to be seen by your surgeon.  Guidelines for how soon to be seen are listed in your "After Visit Summary", but are typically between 1-4 weeks after surgery.   MAKE SURE YOU:  Understand these instructions.  Get help right away if you are not doing well or get worse.    Thank you for letting us be a part of your medical care team.  It is a privilege we respect greatly.  We hope these instructions will help you stay on track for a fast and full recovery!   Increase activity slowly as tolerated    Complete by:  As directed      Allergies as of 03/07/2017      Reactions   Codeine Nausea And Vomiting      Medication List    STOP taking these medications   aspirin 81 MG tablet Replaced by:  aspirin 81 MG chewable tablet     TAKE these medications   albuterol 108 (90  Base) MCG/ACT inhaler Commonly known as:  PROVENTIL HFA;VENTOLIN HFA Inhale 2 puffs into the lungs every 4 (four) hours as needed.   aspirin 81 MG chewable tablet Chew 1 tablet (81 mg total) by mouth 2 (two) times daily. Replaces:  aspirin 81 MG tablet   atorvastatin 40 MG tablet Commonly known as:  LIPITOR TAKE 1 TABLET BY MOUTH EVERY DAY What changed:  See the new instructions.   docusate sodium 100 MG capsule Commonly known as:  COLACE Take 1 capsule (100 mg total) by mouth 2 (two) times daily.   enalapril 10 MG tablet Commonly known as:  VASOTEC TAKE 1 TABLET BY MOUTH EVERY DAY   ferrous sulfate 325 (65 FE) MG tablet Take 1 tablet (325 mg total) by mouth 3 (three) times daily after meals.   HYDROcodone-acetaminophen 7.5-325 MG tablet Commonly known as:  NORCO Take 1-2 tablets by mouth every 4 (four) hours as needed (breakthrough pain).   metFORMIN 500 MG tablet Commonly known as:  GLUCOPHAGE One tablet twice a day for 2 days then 2 tablets twice a day What changed:  how much to take  how to take this  when to take this  additional instructions   methocarbamol 500 MG tablet Commonly known as:  ROBAXIN Take 1 tablet (500 mg total) by mouth every 6 (six) hours as needed for muscle spasms.   ondansetron 8 MG disintegrating tablet Commonly known as:  ZOFRAN ODT Take 1 tablet (8 mg total) by mouth every 8 (eight) hours as needed for nausea or vomiting. What changed:  when to take this   ONETOUCH VERIO test strip Generic drug:  glucose blood TEST SUGAR DAILY   polyethylene glycol packet Commonly known as:  MIRALAX / GLYCOLAX Take 17 g by mouth daily as needed for mild constipation.   SINUS PO Take 2 tablets by mouth daily as needed (sinus).   tadalafil 5 MG tablet Commonly known as:  CIALIS Take 1 tablet (5 mg total) by mouth daily. What changed:  when to take this  reasons to take this   verapamil 180 MG 24 hr capsule Commonly known as:  VERELAN  PM TAKE 1 CAPSULE BY MOUTH TWICE A DAY (NEED OFFICE VISIT)            Durable Medical Equipment        Start     Ordered   03/06/17 [phone]  For home use only DME Other see comment  Once    Comments:  Requesting Gerichair or Recliner   03/06/17 6962     Follow-up Information    Paralee Cancel, MD. Schedule an appointment as soon as possible for a visit in 2 week(s).   Specialty:  Orthopedic Surgery Contact information: [address] [place] [address] [phone]           Signed: Ardeen Jourdain, PA-C Orthopaedic Surgery 03/07/2017, 11:15 AM

## 2017-03-07 NOTE — Progress Notes (Signed)
Physical Therapy Treatment Patient Details Name: Craig Robertson MRN: [id number] DOB: [birth year] Today's Date: 03/07/2017    History of Present Illness s/p L THA    PT Comments    Pt progressing with bed mobility.  Pt performed home therex program and reviewed progression of same with written instructions provided.   Follow Up Recommendations  DC plan and follow up therapy as arranged by surgeon     Equipment Recommendations  None recommended by PT    Recommendations for Other Services OT consult     Precautions / Restrictions Precautions Precautions: Fall Restrictions Weight Bearing Restrictions: No Other Position/Activity Restrictions: WBAT    Mobility  Bed Mobility Overal bed mobility: Needs Assistance Bed Mobility: Supine to Sit;Sit to Supine     Supine to sit: Min guard;Supervision Sit to supine: Min assist;Min guard   General bed mobility comments: Pt in/out bed and educated on use of sheet/leg lifter to self assist.  Transfers Overall transfer level: Needs assistance Equipment used: Rolling walker (2 wheeled) Transfers: Sit to/from Stand Sit to Stand: Min guard;Supervision         General transfer comment: cues for hand placement and safety   Ambulation/Gait                 Stairs            Wheelchair Mobility    Modified Rankin (Stroke Patients Only)       Balance Overall balance assessment: No apparent balance deficits (not formally assessed)                                          Cognition Arousal/Alertness: Awake/alert Behavior During Therapy: WFL for tasks assessed/performed Overall Cognitive Status: Within Functional Limits for tasks assessed                                        Exercises Total Joint Exercises Ankle Circles/Pumps: AROM;Both;20 reps;Supine Quad Sets: AROM;Both;Supine;10 reps Short Arc Quad: AROM;10 reps;Seated;Left Heel Slides: AAROM;20 reps;Supine;Left Hip  ABduction/ADduction: AAROM;Left;15 reps;Supine    General Comments        Pertinent Vitals/Pain Pain Assessment: 0-10 Pain Score: 5  Pain Location: L hip Pain Descriptors / Indicators: Sore Pain Intervention(s): Limited activity within patient's tolerance;Monitored during session;Premedicated before session    Home Living                      Prior Function            PT Goals (current goals can now be found in the care plan section) Acute Rehab PT Goals Patient Stated Goal: none stated PT Goal Formulation: With patient Time For Goal Achievement: 03/09/17 Potential to Achieve Goals: Good Progress towards PT goals: Progressing toward goals    Frequency    7X/week      PT Plan Current plan remains appropriate    Co-evaluation              AM-PAC PT "6 Clicks" Daily Activity  Outcome Measure  Difficulty turning over in bed (including adjusting bedclothes, sheets and blankets)?: Total Difficulty moving from lying on back to sitting on the side of the bed? : A Lot Difficulty sitting down on and standing up from a chair with arms (e.g., wheelchair, bedside commode, etc,.)?:  A Little Help needed moving to and from a bed to chair (including a wheelchair)?: A Little Help needed walking in hospital room?: A Little Help needed climbing 3-5 steps with a railing? : A Lot 6 Click Score: 14    End of Session   Activity Tolerance: Patient tolerated treatment well Patient left: Other (comment) (sitting EOB) Nurse Communication: Mobility status PT Visit Diagnosis: Difficulty in walking, not elsewhere classified (R26.2)     Time: [id number] PT Time Calculation (min) (ACUTE ONLY): 27 min  Charges:  $Therapeutic Exercise: 8-22 mins $Therapeutic Activity: 8-22 mins                    G Codes:       Pg [phone]    Merion Caton 03/07/2017, 10:52 AM

## 2017-03-08 LAB — TYPE AND SCREEN
ABO/RH(D): B POS
ANTIBODY SCREEN: NEGATIVE
UNIT DIVISION: 0
Unit division: 0

## 2017-03-08 LAB — BPAM RBC
BLOOD PRODUCT EXPIRATION DATE: 201809042359
Blood Product Expiration Date: 201809062359
UNIT TYPE AND RH: 7300
UNIT TYPE AND RH: 7300

## 2017-03-24 ENCOUNTER — Other Ambulatory Visit: Payer: Self-pay | Admitting: Family Medicine

## 2017-04-01 ENCOUNTER — Encounter: Payer: Self-pay | Admitting: Family Medicine

## 2017-04-01 ENCOUNTER — Ambulatory Visit (INDEPENDENT_AMBULATORY_CARE_PROVIDER_SITE_OTHER): Payer: 59 | Admitting: Family Medicine

## 2017-04-01 VITALS — BP 126/76 | Ht 72.0 in | Wt 250.4 lb

## 2017-04-01 DIAGNOSIS — E118 Type 2 diabetes mellitus with unspecified complications: Secondary | ICD-10-CM | POA: Diagnosis not present

## 2017-04-01 DIAGNOSIS — I10 Essential (primary) hypertension: Secondary | ICD-10-CM | POA: Diagnosis not present

## 2017-04-01 DIAGNOSIS — Z0000 Encounter for general adult medical examination without abnormal findings: Secondary | ICD-10-CM | POA: Diagnosis not present

## 2017-04-01 DIAGNOSIS — Z23 Encounter for immunization: Secondary | ICD-10-CM

## 2017-04-01 MED ORDER — METFORMIN HCL 500 MG PO TABS: ORAL_TABLET | ORAL | 5 refills | Status: DC

## 2017-04-01 MED ORDER — ENALAPRIL MALEATE 10 MG PO TABS: 10.0000 mg | ORAL_TABLET | Freq: Every day | ORAL | 0 refills | Status: DC

## 2017-04-01 MED ORDER — ATORVASTATIN CALCIUM 40 MG PO TABS: 40.0000 mg | ORAL_TABLET | Freq: Every day | ORAL | 1 refills | Status: DC

## 2017-04-01 MED ORDER — VERAPAMIL HCL ER 180 MG PO CP24: ORAL_CAPSULE | ORAL | 5 refills | Status: DC

## 2017-04-01 NOTE — Progress Notes (Signed)
   Subjective:    Patient ID: Craig Robertson, male    DOB: [birth year], 51 y.o.   MRN: [id number]  HPI The patient comes in today for a wellness visit.    A review of their health history was completed.  A review of medications was also completed.  Any needed refills; None   Eating habits:  Patient states he has changed eating habits. Currently making healthier choices   Falls/  MVA accidents in past few months: No MVA. Had a fall in the past 6 months.  Regular exercise:  Patient states walks daily. 20-30 minutes per day.   Specialist pt sees on regular basis:  Orthopedics   Preventative health issues were discussed.   Additional concerns:  Patient states no other concerns this visit.  Patient claims compliance with diabetes medication. No obvious side effects. Reports no substantial low sugar spells. Most numbers are generally in good range when checked fasting. Generally does not miss a dose of medication. Watching diabetic diet closely   Review of Systems  Constitutional: Negative for activity change, appetite change and fever.  HENT: Negative for congestion and rhinorrhea.   Eyes: Negative for discharge.  Respiratory: Negative for cough and wheezing.   Cardiovascular: Negative for chest pain.  Gastrointestinal: Negative for abdominal pain, blood in stool and vomiting.  Genitourinary: Negative for difficulty urinating and frequency.  Musculoskeletal: Negative for neck pain.  Skin: Negative for rash.  Allergic/Immunologic: Negative for environmental allergies and food allergies.  Neurological: Negative for weakness and headaches.  Psychiatric/Behavioral: Negative for agitation.  All other systems reviewed and are negative.      Objective:   Physical Exam  Constitutional: He appears well-developed and well-nourished.  HENT:  Head: Normocephalic and atraumatic.  Right Ear: External ear normal.  Left Ear: External ear normal.  Nose: Nose normal.  Mouth/Throat:  Oropharynx is clear and moist.  Eyes: Pupils are equal, round, and reactive to light. EOM are normal.  Neck: Normal range of motion. Neck supple. No thyromegaly present.  Cardiovascular: Normal rate, regular rhythm and normal heart sounds.   No murmur heard. Pulmonary/Chest: Effort normal and breath sounds normal. No respiratory distress. He has no wheezes.  Abdominal: Soft. Bowel sounds are normal. He exhibits no distension and no mass. There is no tenderness.  Genitourinary: Penis normal.  Musculoskeletal: Normal range of motion. He exhibits no edema.  Lymphadenopathy:    He has no cervical adenopathy.  Neurological: He is alert. He exhibits normal muscle tone.  Skin: Skin is warm and dry. No erythema.  Psychiatric: He has a normal mood and affect. His behavior is normal. Judgment normal.  Vitals reviewed.   C diabetic foot exam    Assessment & Plan:  Impression 1 wellness exam patient needs blood work will order. Diet discussed. Exercise discussed. Currently out of work status post hip replacement  #2 type 2 diabetes. On medication. Control appears to be good fasting sugars under 1:30. Will check urine protein test. Yearly eye exams encourage. Diabetic foot exam within normal limits  #3 hypertension good control plan diet exercise discussed as noted medications refilled appropriate blood work./Flu shot today

## 2017-04-04 LAB — MICROALBUMIN / CREATININE URINE RATIO
CREATININE, UR: 210.6 mg/dL
Microalb/Creat Ratio: 4.7 mg/g creat (ref 0.0–30.0)
Microalbumin, Urine: 9.9 ug/mL

## 2017-04-04 LAB — BASIC METABOLIC PANEL
BUN/Creatinine Ratio: 15 (ref 9–20)
BUN: 13 mg/dL (ref 6–24)
CALCIUM: 9.6 mg/dL (ref 8.7–10.2)
CO2: 26 mmol/L (ref 20–29)
CREATININE: 0.88 mg/dL (ref 0.76–1.27)
Chloride: 102 mmol/L (ref 96–106)
GFR calc Af Amer: 117 mL/min/{1.73_m2} (ref >59)
GFR, EST NON AFRICAN AMERICAN: 101 mL/min/{1.73_m2} (ref >59)
GLUCOSE: 96 mg/dL (ref 65–99)
Potassium: 4.6 mmol/L (ref 3.5–5.2)
SODIUM: 143 mmol/L (ref 134–144)

## 2017-04-04 LAB — LIPID PANEL
CHOLESTEROL TOTAL: 101 mg/dL (ref 100–199)
Chol/HDL Ratio: 2.7 ratio (ref 0.0–5.0)
HDL: 37 mg/dL — ABNORMAL LOW (ref >39)
LDL Calculated: 53 mg/dL (ref 0–99)
Triglycerides: 55 mg/dL (ref 0–149)
VLDL CHOLESTEROL CAL: 11 mg/dL (ref 5–40)

## 2017-04-04 LAB — HEPATIC FUNCTION PANEL
ALK PHOS: 76 IU/L (ref 39–117)
ALT: 23 IU/L (ref 0–44)
AST: 18 IU/L (ref 0–40)
Albumin: 4 g/dL (ref 3.5–5.5)
BILIRUBIN TOTAL: 0.4 mg/dL (ref 0.0–1.2)
BILIRUBIN, DIRECT: 0.14 mg/dL (ref 0.00–0.40)
Total Protein: 7.3 g/dL (ref 6.0–8.5)

## 2017-04-04 LAB — PSA: PROSTATE SPECIFIC AG, SERUM: 1.3 ng/mL (ref 0.0–4.0)

## 2017-04-08 ENCOUNTER — Encounter: Payer: Self-pay | Admitting: Family Medicine

## 2017-04-08 ENCOUNTER — Telehealth: Payer: Self-pay | Admitting: Family Medicine

## 2017-04-08 ENCOUNTER — Other Ambulatory Visit: Payer: Self-pay | Admitting: Family Medicine

## 2017-04-08 NOTE — Telephone Encounter (Signed)
Spoke with patient and informed her per Dr.Steve Luking- Call patient and liver and kidney function excellent, glucose and lipid and psa all very good, stay on same medications and follow up as recommended. Patient verbalized understanding.

## 2017-04-08 NOTE — Telephone Encounter (Signed)
See Labs 

## 2017-04-08 NOTE — Telephone Encounter (Signed)
Left message return call 04/08/17 

## 2017-04-08 NOTE — Telephone Encounter (Signed)
Call pt liver and kidney function excellent, glucose and lipid panel anf psa all very good, stay on same meds andf f u as recommended

## 2017-04-30 ENCOUNTER — Telehealth: Payer: Self-pay | Admitting: Family Medicine

## 2017-04-30 NOTE — Telephone Encounter (Signed)
Dr. Brett Canales to see on Wednesday

## 2017-04-30 NOTE — Telephone Encounter (Signed)
Pt called his insurance company regarding Cialis and they stated that it is covered with a 25 dollar co pay. Pt is needing refills on tadalafil (CIALIS) 5 MG tablet enalapril (VASOTEC) 10 MG tablet    [facility]

## 2017-04-30 NOTE — Telephone Encounter (Signed)
Six mo worth of both meds --already on drug list

## 2017-05-01 MED ORDER — TADALAFIL 5 MG PO TABS: 5.0000 mg | ORAL_TABLET | Freq: Every day | ORAL | 5 refills | Status: DC | PRN

## 2017-05-01 MED ORDER — ENALAPRIL MALEATE 10 MG PO TABS: 10.0000 mg | ORAL_TABLET | Freq: Every day | ORAL | 1 refills | Status: DC

## 2017-05-01 NOTE — Telephone Encounter (Signed)
Rx sent to the drug store and pt is aware.

## 2017-05-07 ENCOUNTER — Other Ambulatory Visit: Payer: Self-pay | Admitting: *Deleted

## 2017-05-07 MED ORDER — METFORMIN HCL 500 MG PO TABS: ORAL_TABLET | ORAL | 1 refills | Status: DC

## 2017-05-10 ENCOUNTER — Ambulatory Visit: Payer: 59 | Admitting: Family Medicine

## 2017-05-13 ENCOUNTER — Other Ambulatory Visit: Payer: Self-pay | Admitting: Family Medicine

## 2017-05-23 ENCOUNTER — Other Ambulatory Visit: Payer: Self-pay | Admitting: *Deleted

## 2017-05-23 MED ORDER — VERAPAMIL HCL ER 180 MG PO CP24: ORAL_CAPSULE | ORAL | 1 refills | Status: DC

## 2017-07-02 LAB — HM DIABETES EYE EXAM

## 2017-08-28 ENCOUNTER — Telehealth: Payer: Self-pay | Admitting: Family Medicine

## 2017-08-28 NOTE — Telephone Encounter (Signed)
Patient is requesting new Rx for cpap and the office notes to go along with it sent to [facility].  He said he switched companies and his new company sent us something, but I do not see anything in the system.  [facility], [place], [place]

## 2017-08-28 NOTE — Telephone Encounter (Signed)
See sleep study from late 2016, send copy ask pt his current settings for his cpap

## 2017-08-29 NOTE — Telephone Encounter (Signed)
I called and left a message asked that she r/c. 

## 2017-08-29 NOTE — Telephone Encounter (Signed)
Patient did not know and he instructed me to call [facility] I spoke with Lethea Killingsyan Joyce whom states pt is on auto 5-15 cm of H20.Please advise

## 2017-08-30 ENCOUNTER — Telehealth: Payer: Self-pay | Admitting: Family Medicine

## 2017-08-30 NOTE — Telephone Encounter (Signed)
Patient has new pharmacy for his C pap supplies and needing new prescription and office notes sent to [facility]. Fax-[phone]

## 2017-08-30 NOTE — Telephone Encounter (Signed)
Patient settings sent to you yesterday.Please advise.

## 2017-08-30 NOTE — Telephone Encounter (Signed)
Pt called to check on the status of this.

## 2017-09-02 NOTE — Telephone Encounter (Signed)
Discussed with patient in great detai l Pt only needs supplies for his current CPAP & has order for them [facility] & he will go ahead and get needed supplies [facility]   Will discuss CPAP use & benefit with Dr. Brett CanalesSteve at his 09/27/17 appointment  If he decides to change providers, we would order supplies from [facility] due to they are contracted with his insurance as a preferred provider [facility] is not a participating provider with [facility]'s [facility])  (previous orders, test results, any previously faxed records are in file cabinet)

## 2017-09-02 NOTE — Telephone Encounter (Signed)
Ok lets do autotitrate cpap with same settings

## 2017-09-27 ENCOUNTER — Ambulatory Visit: Payer: 59 | Admitting: Family Medicine

## 2017-09-30 ENCOUNTER — Telehealth: Payer: Self-pay | Admitting: *Deleted

## 2017-09-30 ENCOUNTER — Encounter: Payer: Self-pay | Admitting: Family Medicine

## 2017-09-30 ENCOUNTER — Ambulatory Visit: Payer: 59 | Admitting: Family Medicine

## 2017-09-30 VITALS — BP 134/90 | Ht 72.0 in | Wt 256.0 lb

## 2017-09-30 DIAGNOSIS — R350 Frequency of micturition: Secondary | ICD-10-CM | POA: Diagnosis not present

## 2017-09-30 DIAGNOSIS — I10 Essential (primary) hypertension: Secondary | ICD-10-CM

## 2017-09-30 DIAGNOSIS — Z13220 Encounter for screening for lipoid disorders: Secondary | ICD-10-CM

## 2017-09-30 DIAGNOSIS — Z1211 Encounter for screening for malignant neoplasm of colon: Secondary | ICD-10-CM

## 2017-09-30 DIAGNOSIS — E119 Type 2 diabetes mellitus without complications: Secondary | ICD-10-CM | POA: Diagnosis not present

## 2017-09-30 DIAGNOSIS — Z79899 Other long term (current) drug therapy: Secondary | ICD-10-CM

## 2017-09-30 LAB — POCT URINALYSIS DIPSTICK: PH UA: 5 (ref 5.0–8.0)

## 2017-09-30 LAB — POCT GLYCOSYLATED HEMOGLOBIN (HGB A1C): Hemoglobin A1C: 6.2

## 2017-09-30 MED ORDER — METFORMIN HCL 500 MG PO TABS: ORAL_TABLET | ORAL | 1 refills | Status: DC

## 2017-09-30 MED ORDER — VERAPAMIL HCL ER 180 MG PO CP24: ORAL_CAPSULE | ORAL | 1 refills | Status: DC

## 2017-09-30 MED ORDER — TAMSULOSIN HCL 0.4 MG PO CAPS: 0.4000 mg | ORAL_CAPSULE | Freq: Every day | ORAL | 3 refills | Status: DC

## 2017-09-30 MED ORDER — HYDROCORTISONE 2.5 % RE CREA: 1.0000 "application " | TOPICAL_CREAM | Freq: Two times a day (BID) | RECTAL | 0 refills | Status: DC

## 2017-09-30 MED ORDER — ATORVASTATIN CALCIUM 40 MG PO TABS: 40.0000 mg | ORAL_TABLET | Freq: Every day | ORAL | 1 refills | Status: DC

## 2017-09-30 MED ORDER — ENALAPRIL MALEATE 10 MG PO TABS: 10.0000 mg | ORAL_TABLET | Freq: Every day | ORAL | 1 refills | Status: DC

## 2017-09-30 NOTE — Progress Notes (Signed)
Subjective:    Patient ID: Craig Robertson, male    DOB: 1965/07/29, 52 y.o.   MRN: [id number]  Diabetes  He presents for his follow-up diabetic visit. He has type 2 diabetes mellitus. Home blood sugar record trend: 80's. He does not see a podiatrist.Eye exam is current (3 months ago).    frequent urination started a few years ago. Off and on. freq urin during the day. Urine flow definitel dimished     Would like to get Hemorrhoid check . Has seen blood in stool. Pt notes itching and burning discomfort. Small amnt of br red blood. Pos hx of bl per stool long time ago ha d colon    Patient claims compliance with diabetes medication. No obvious side effects. Reports no substantial low sugar spells. Most numbers are generally in good range when checked fasting. Generally does not miss a dose of medication. Watching diabetic diet closely   Patient continues to take lipid medication regularly. No obvious side effects from it. Generally does not miss a dose. Prior blood work results are reviewed with patient. Patient continues to work on fat intake in diet  Blood pressure medicine and blood pressure levels reviewed today with patient. Compliant with blood pressure medicine. States does not miss a dose. No obvious side effects. Blood pressure generally good when checked elsewhere. Watching salt intake.  Handling cpap well , t, sticking with it  Much less snoring, claims full compliance.  States it definitely helps no obvious difficulty  Positive history of stroke.  No new neurological symptoms  Diet definitely improved  tking all meds reg   Results for orders placed or performed in visit on 09/30/17  POCT glycosylated hemoglobin (Hb A1C)  Result Value Ref Range   Hemoglobin A1C 6.2   POCT urinalysis dipstick  Result Value Ref Range   Color, UA     Clarity, UA     Glucose, UA     Bilirubin, UA     Ketones, UA     Spec Grav, UA >=1.030 (A) 1.010 - 1.025   Blood, UA     pH, UA  5.0 5.0 - 8.0   Protein, UA     Urobilinogen, UA  0.2 or 1.0 E.U./dL   Nitrite, UA     Leukocytes, UA  Negative   Appearance     Odor    HM DIABETES EYE EXAM  Result Value Ref Range   HM Diabetic Eye Exam No Retinopathy No Retinopathy    Review of Systems No headache, no major weight loss or weight gain, no chest pain no back pain abdominal pain no change in bowel habits complete ROS otherwise negative     Objective:   Physical Exam Alert and oriented, vitals reviewed and stable, NAD ENT-TM's and ext canals WNL bilat via otoscopic exam Soft palate, tonsils and post pharynx WNL via oropharyngeal exam Neck-symmetric, no masses; thyroid nonpalpable and nontender Pulmonary-no tachypnea or accessory muscle use; Clear without wheezes via auscultation Card--no abnrml murmurs, rhythm reg and rate WNL Carotid pulses symmetric, without bruits  abdominal exam no discrete tenderness excellent bowel sounds no rebound no guarding rectal exam positive inflamed hemorrhoid.  Prostate diffusely enlarged impression       Assessment & Plan:  Impression1 2 diabetes.  Control discussed.  A1c 6.2% diet discussed.  2.  Essential hypertension.  Good blood pressure.  Maintain same dose of medicine rationale discussed  3.  Hyperlipidemia exact status uncertain.  We will check blood work  compliance with medicine we encouraged  4.  Positive blood per rectum likely associated with irritated hemorrhoids.  Will try Anusol HC.  Time for colonoscopy anyway.  Discussed.  Will initiate with Dr. Dionicia Ableraman  5.  History of stroke.  No recurrent neurological symptomatology continue preventive measures  6.  Prostate hypertrophy.  Discussed.  Leading to frequent urination hesitancy.  PSA normal.  Will initiate Flomax  7.  Chronic CPAP therapy.  Patient has confirmed obstructive sleep apnea.  Device helps tremendously.  Patient to maintain.  Initiate Flomax as noted.  GI referral.  Appropriate blood work.  Diet  exercise discussed.  Medications refilled.  Further recommendations based on results Greater than 50% of this 40 minute face to face visit was spent in counseling and discussion and coordination of care regarding the above diagnosis/diagnosies

## 2017-09-30 NOTE — Telephone Encounter (Signed)
Pt seen today and wanted new cpap prescription [facility]. Ok per dr Brett Canalessteve.

## 2017-10-03 ENCOUNTER — Encounter (INDEPENDENT_AMBULATORY_CARE_PROVIDER_SITE_OTHER): Payer: Self-pay | Admitting: *Deleted

## 2017-10-03 ENCOUNTER — Telehealth: Payer: Self-pay | Admitting: Family Medicine

## 2017-10-03 NOTE — Telephone Encounter (Signed)
Called pt to verify what's needed  Pt has decided to stay with [facility] for his CPAP needs  Needs order for CPAP supplies faxed to [facility] (only supplies, pt states he does not need a new machine at this time)

## 2017-10-03 NOTE — Telephone Encounter (Signed)
Please sign order for CPAP supplies so I may fax with required documentation  In green folder in yellow box

## 2017-10-04 ENCOUNTER — Other Ambulatory Visit: Payer: Self-pay | Admitting: Family Medicine

## 2017-10-05 LAB — LIPID PANEL
CHOL/HDL RATIO: 2 ratio (ref 0.0–5.0)
Cholesterol, Total: 122 mg/dL (ref 100–199)
HDL: 60 mg/dL (ref >39)
LDL Calculated: 53 mg/dL (ref 0–99)
TRIGLYCERIDES: 47 mg/dL (ref 0–149)
VLDL Cholesterol Cal: 9 mg/dL (ref 5–40)

## 2017-10-05 LAB — HEPATIC FUNCTION PANEL
ALT: 24 IU/L (ref 0–44)
AST: 30 IU/L (ref 0–40)
Albumin: 4.4 g/dL (ref 3.5–5.5)
Alkaline Phosphatase: 77 IU/L (ref 39–117)
BILIRUBIN, DIRECT: 0.13 mg/dL (ref 0.00–0.40)
Bilirubin Total: 0.4 mg/dL (ref 0.0–1.2)
Total Protein: 7.5 g/dL (ref 6.0–8.5)

## 2017-10-07 NOTE — Telephone Encounter (Signed)
LMOM to notify pt that [facility] was [facility]ed [facility]  Order filed

## 2017-10-07 NOTE — Telephone Encounter (Signed)
Order was signed, faxed to [facility] this [facility] been [facility]  [facility]

## 2017-10-09 ENCOUNTER — Encounter: Payer: Self-pay | Admitting: Family Medicine

## 2017-11-05 ENCOUNTER — Other Ambulatory Visit: Payer: Self-pay | Admitting: Family Medicine

## 2017-11-27 ENCOUNTER — Other Ambulatory Visit: Payer: Self-pay | Admitting: Family Medicine

## 2017-11-27 ENCOUNTER — Telehealth: Payer: Self-pay | Admitting: Family Medicine

## 2017-11-27 DIAGNOSIS — K921 Melena: Secondary | ICD-10-CM

## 2017-11-27 NOTE — Telephone Encounter (Signed)
Referral placed. Pt notified.  

## 2017-11-27 NOTE — Telephone Encounter (Signed)
Lets do [place] o g I re blood in stools

## 2017-11-27 NOTE — Telephone Encounter (Signed)
We referred Craig Robertson to Dr. Patty Sermons office for a colonoscopy.  He said it will be 4-6 months before they can set him up.  He would like a referral elsewhere.

## 2017-12-05 ENCOUNTER — Encounter: Payer: Self-pay | Admitting: Family Medicine

## 2017-12-05 ENCOUNTER — Encounter: Payer: Self-pay | Admitting: Physician Assistant

## 2017-12-06 ENCOUNTER — Encounter: Payer: Self-pay | Admitting: Physician Assistant

## 2017-12-18 ENCOUNTER — Telehealth: Payer: Self-pay | Admitting: *Deleted

## 2017-12-18 MED ORDER — GLUCOSE BLOOD VI STRP: ORAL_STRIP | 5 refills | Status: DC

## 2017-12-18 NOTE — Telephone Encounter (Signed)
Spoke with pt and verified test strip name. Sent strips in to [facility]. Script is written out for lancets awaiting signature.

## 2017-12-18 NOTE — Telephone Encounter (Signed)
Patient called stating he needs lansis and test strips sent to [facility]. Please advise [phone]

## 2017-12-18 NOTE — Telephone Encounter (Signed)
One touch verio on med list. Just want to make sure this is what he wants and he needs lancets

## 2017-12-18 NOTE — Telephone Encounter (Signed)
Left message with pt to find out name of testing strips he uses.

## 2017-12-20 ENCOUNTER — Other Ambulatory Visit: Payer: Self-pay | Admitting: *Deleted

## 2017-12-20 MED ORDER — BLOOD GLUCOSE MONITOR KIT: PACK | 0 refills | Status: DC

## 2017-12-27 ENCOUNTER — Ambulatory Visit: Payer: Self-pay | Admitting: Physician Assistant

## 2018-01-03 ENCOUNTER — Ambulatory Visit: Payer: 59 | Admitting: Physician Assistant

## 2018-01-03 ENCOUNTER — Encounter: Payer: Self-pay | Admitting: Physician Assistant

## 2018-01-03 VITALS — BP 132/88 | HR 72 | Ht 70.08 in | Wt 256.4 lb

## 2018-01-03 DIAGNOSIS — K921 Melena: Secondary | ICD-10-CM

## 2018-01-03 MED ORDER — PEG 3350-KCL-NABCB-NACL-NASULF 236 G PO SOLR: 4000.0000 mL | Freq: Once | ORAL | 0 refills | Status: AC

## 2018-01-03 NOTE — Patient Instructions (Signed)
If you are age 50 or older, your body mass index should be between 23-30. Your Body mass index is 36.7 kg/m. If this is out of the aforementioned range listed, please consider follow up with your Primary Care Provider.  If you are age 50 or younger, your body mass index should be between 19-25. Your Body mass index is 36.7 kg/m. If this is out of the aformentioned range listed, please consider follow up with your Primary Care Provider.   You have been scheduled for a colonoscopy. Please follow written instructions given to you at your visit today.  Please pick up your prep supplies at the pharmacy within the next 1-3 days. If you use inhalers (even only as needed), please bring them with you on the day of your procedure. Your physician has requested that you go to www.startemmi.com and enter the access code given to you at your visit today. This web site gives a general overview about your procedure. However, you should still follow specific instructions given to you by our office regarding your preparation for the procedure.  We have sent the following medications to your pharmacy for you to pick up at your convenience: Golytely  Thank you for choosing me and [facility].  Hyacinth MeekerJennifer Lemmon, PA-C

## 2018-01-03 NOTE — Progress Notes (Signed)
Chief Complaint: Blood in stool  HPI:    [name] is a 52 year old African-American male with a past medical history as listed below, who was referred to me by Mikey Kirschner, MD for a complaint of blood in stool.      09/30/2017 office visit PCP describes seeing some bright red blood and had some itching and discomfort near his rectum.  Rectal exam at that time showed positive inflamed hemorrhoid.  He was prescribed Anusol HC and referred to our office for colonoscopy.    Today, explains that for the past year off-and-on he has seen some bright red blood mixed in with his stool and on the toilet paper at times.  Also describes occasional "stinging and itchiness" of his rectum associated with times of bleeding.  He has tried Anusol in the past which he has applied at night and it has helped a little bit.  Has had trouble with hemorrhoids off and on throughout his entire life.  Does describe some heavy lifting with his job occasionally.    Denies fever, chills, change in bowel habits, melena, nausea, vomiting, heartburn, reflux, family history of colon cancer or polyps or abdominal pain.  Past Medical History:  Diagnosis Date  . Arthritis   . Complication of anesthesia    "slow to wake"  . Diabetes mellitus without complication ([place])   . Dyspnea    allegy season   . ED (erectile dysfunction)   . History of hiatal hernia   . Hyperlipidemia   . Hypertension   . Low testosterone   . Sleep apnea    on CPAP   . Stroke [facility]) 2015   TIA     Past Surgical History:  Procedure Laterality Date  . BACK SURGERY  2015   lumbar surgery for bulging disc at surgery center   . TOTAL HIP ARTHROPLASTY Left 03/05/2017   Procedure: LEFT TOTAL HIP ARTHROPLASTY ANTERIOR APPROACH;  Surgeon: Paralee Cancel, MD;  Location: [facility] ORS;  Service: Orthopedics;  Laterality: Left;  70 mins    Current Outpatient Medications  Medication Sig Dispense Refill  . albuterol (PROVENTIL HFA;VENTOLIN HFA) 108 (90  Base) MCG/ACT inhaler Inhale 2 puffs into the lungs every 4 (four) hours as needed. 1 Inhaler 0  . aspirin 81 MG chewable tablet Chew 1 tablet (81 mg total) by mouth 2 (two) times daily. 60 tablet 0  . atorvastatin (LIPITOR) 40 MG tablet Take 1 tablet (40 mg total) by mouth daily. 90 tablet 1  . blood glucose meter kit and supplies KIT Dispense based on patient and insurance preference. Use up to test glucose once daily. ICD 10 code E11.9. 1 each 0  . CIALIS 5 MG tablet TAKE 1 TABLET (5 MG TOTAL) BY MOUTH DAILY. 30 tablet 5  . docusate sodium (COLACE) 100 MG capsule Take 1 capsule (100 mg total) by mouth 2 (two) times daily. 20 capsule 0  . enalapril (VASOTEC) 10 MG tablet Take 1 tablet (10 mg total) by mouth daily. 90 tablet 1  . ferrous sulfate 325 (65 FE) MG tablet Take 1 tablet (325 mg total) by mouth 3 (three) times daily after meals. 30 tablet 0  . glucose blood (ONETOUCH VERIO) test strip TEST SUGAR DAILY 100 each 5  . HYDROcodone-acetaminophen (NORCO) 7.5-325 MG tablet Take 1-2 tablets by mouth every 4 (four) hours as needed (breakthrough pain). 60 tablet 0  . hydrocortisone (ANUSOL-HC) 2.5 % rectal cream Place 1 application rectally 2 (two) times daily. 30 g 0  .  metFORMIN (GLUCOPHAGE) 500 MG tablet 2 tablets twice a day 360 tablet 1  . methocarbamol (ROBAXIN) 500 MG tablet Take 1 tablet (500 mg total) by mouth every 6 (six) hours as needed for muscle spasms. 60 tablet 0  . ondansetron (ZOFRAN ODT) 8 MG disintegrating tablet Take 1 tablet (8 mg total) by mouth every 8 (eight) hours as needed for nausea or vomiting. (Patient taking differently: Take 8 mg by mouth daily as needed for nausea or vomiting. ) 20 tablet 1  . polyethylene glycol (MIRALAX / GLYCOLAX) packet Take 17 g by mouth daily as needed for mild constipation. 14 each 0  . Pseudoephedrine-Acetaminophen (SINUS PO) Take 2 tablets by mouth daily as needed (sinus).    . tadalafil (CIALIS) 5 MG tablet TAKE 1 TABLET (5 MG TOTAL) BY  MOUTH DAILY AS NEEDED FOR ERECTILE DYSFUNCTION. 30 tablet 3  . tamsulosin (FLOMAX) 0.4 MG CAPS capsule Take 1 capsule (0.4 mg total) by mouth at bedtime. 90 capsule 3  . verapamil (VERELAN PM) 180 MG 24 hr capsule TAKE 1 CAPSULE BY MOUTH TWICE A DAY 180 capsule 1   No current facility-administered medications for this visit.     Allergies as of 01/03/2018 - Review Complete 09/30/2017  Allergen Reaction Noted  . Codeine Nausea And Vomiting 12/09/2013    Family History  Problem Relation Age of Onset  . Diabetes Cousin   . Heart attack Mother   . Hypertension Father   . Leukemia Father   . Hypertension Brother     Social History   Socioeconomic History  . Marital status: Married    Spouse name: Not on file  . Number of children: Not on file  . Years of education: Not on file  . Highest education level: Not on file  Occupational History  . Not on file  Social Needs  . Financial resource strain: Not on file  . Food insecurity:    Worry: Not on file    Inability: Not on file  . Transportation needs:    Medical: Not on file    Non-medical: Not on file  Tobacco Use  . Smoking status: Never Smoker  . Smokeless tobacco: Never Used  Substance and Sexual Activity  . Alcohol use: No    Alcohol/week: 0.0 oz  . Drug use: No  . Sexual activity: Not on file  Lifestyle  . Physical activity:    Days per week: Not on file    Minutes per session: Not on file  . Stress: Not on file  Relationships  . Social connections:    Talks on phone: Not on file    Gets together: Not on file    Attends religious service: Not on file    Active member of club or organization: Not on file    Attends meetings of clubs or organizations: Not on file    Relationship status: Not on file  . Intimate partner violence:    Fear of current or ex partner: Not on file    Emotionally abused: Not on file    Physically abused: Not on file    Forced sexual activity: Not on file  Other Topics Concern  .  Not on file  Social History Narrative  . Not on file    Review of Systems:    Constitutional: No weight loss, fever or chills Skin: No rash  Cardiovascular: No chest pain Respiratory: No SOB  Gastrointestinal: See HPI and otherwise negative Genitourinary: No dysuria Neurological: No headache, dizziness  or syncope Musculoskeletal: No new muscle or joint pain Hematologic: No bruising Psychiatric: No history of depression or anxiety   Physical Exam:  Vital signs: BP 132/88 (BP Location: Left Arm, Patient Position: Sitting, Cuff Size: Normal)   Pulse 72   Ht 5' 10.08" (1.78 m)   Wt 256 lb 6 oz (116.3 kg)   BMI 36.70 kg/m    Constitutional:   Pleasant overweight AA male appears to be in NAD, Well developed, Well nourished, alert and cooperative Head:  Normocephalic and atraumatic. Eyes:   PEERL, EOMI. No icterus. Conjunctiva pink. Ears:  Normal auditory acuity. Neck:  Supple Throat: Oral cavity and pharynx without inflammation, swelling or lesion.  Respiratory: Respirations even and unlabored. Lungs clear to auscultation bilaterally.   No wheezes, crackles, or rhonchi.  Cardiovascular: Normal S1, S2. No MRG. Regular rate and rhythm. No peripheral edema, cyanosis or pallor.  Gastrointestinal:  Soft, nondistended, nontender. No rebound or guarding. Normal bowel sounds. No appreciable masses or hepatomegaly. Rectal:  Not performed.  Msk:  Symmetrical without gross deformities. Without edema, no deformity or joint abnormality.  Neurologic:  Alert and  oriented x4;  grossly normal neurologically.  Skin:   Dry and intact without significant lesions or rashes. Psychiatric:  Demonstrates good judgement and reason without abnormal affect or behaviors.  No recent labs.  Assessment: 1.  Hematochezia: Episodes of bright red blood off and on with a bowel movement, some associated rectal irritation as well at times, hemorrhoid visualized by PCP, Anusol helps some; consider most likely  hemorrhoids versus other  Plan: 1.  Scheduled patient for a diagnostic colonoscopy in [place] with Dr. Ardis Hughs.  Did discuss risk, benefits, limitations and alternatives and the patient agrees to proceed. 2.  Patient to follow in clinic per recommendations after procedure above.  Ellouise Newer, PA-C [facility] 01/03/2018, 8:44 AM  Cc: Mikey Kirschner, MD

## 2018-01-06 NOTE — Progress Notes (Signed)
I agree with the above note, plan 

## 2018-02-24 ENCOUNTER — Other Ambulatory Visit: Payer: Self-pay | Admitting: *Deleted

## 2018-02-24 ENCOUNTER — Telehealth: Payer: Self-pay | Admitting: Family Medicine

## 2018-02-24 MED ORDER — ENALAPRIL MALEATE 10 MG PO TABS: 10.0000 mg | ORAL_TABLET | Freq: Every day | ORAL | 0 refills | Status: DC

## 2018-02-24 NOTE — Telephone Encounter (Signed)
Refill sent to pharm.pt dr Brett Canalessteve. Pt notified.

## 2018-02-24 NOTE — Telephone Encounter (Signed)
Requesting refill on enalapril (VASOTEC) 10 MG tablet. He has 3 pills left.  [facility] [place], [place]

## 2018-03-05 ENCOUNTER — Encounter: Payer: Self-pay | Admitting: Gastroenterology

## 2018-03-19 ENCOUNTER — Encounter: Payer: Self-pay | Admitting: Gastroenterology

## 2018-03-19 ENCOUNTER — Ambulatory Visit (AMBULATORY_SURGERY_CENTER): Payer: 59 | Admitting: Gastroenterology

## 2018-03-19 VITALS — BP 132/82 | HR 64 | Temp 98.7°F | Resp 14 | Ht 70.0 in | Wt 256.0 lb

## 2018-03-19 DIAGNOSIS — K921 Melena: Secondary | ICD-10-CM | POA: Diagnosis not present

## 2018-03-19 DIAGNOSIS — K648 Other hemorrhoids: Secondary | ICD-10-CM | POA: Diagnosis present

## 2018-03-19 DIAGNOSIS — K649 Unspecified hemorrhoids: Secondary | ICD-10-CM

## 2018-03-19 MED ORDER — SODIUM CHLORIDE 0.9 % IV SOLN: 500.0000 mL | Freq: Once | INTRAVENOUS | Status: DC

## 2018-03-19 NOTE — Patient Instructions (Signed)
YOU HAD AN ENDOSCOPIC PROCEDURE TODAY AT THE [facility]:   Refer to the procedure report that was given to you for any specific questions about what was found during the examination.  If the procedure report does not answer your questions, please call your gastroenterologist to clarify.  If you requested that your care partner not be given the details of your procedure findings, then the procedure report has been included in a sealed envelope for you to review at your convenience later.  YOU SHOULD EXPECT: Some feelings of bloating in the abdomen. Passage of more gas than usual.  Walking can help get rid of the air that was put into your GI tract during the procedure and reduce the bloating. If you had a lower endoscopy (such as a colonoscopy or flexible sigmoidoscopy) you may notice spotting of blood in your stool or on the toilet paper. If you underwent a bowel prep for your procedure, you may not have a normal bowel movement for a few days.  Please Note:  You might notice some irritation and congestion in your nose or some drainage.  This is from the oxygen used during your procedure.  There is no need for concern and it should clear up in a day or so.  SYMPTOMS TO REPORT IMMEDIATELY:   Following lower endoscopy (colonoscopy or flexible sigmoidoscopy):  Excessive amounts of blood in the stool  Significant tenderness or worsening of abdominal pains  Swelling of the abdomen that is new, acute  Fever of 100F or higher  Please see handouts given to you on Hemorrhoids and Hemorroid banding.  For urgent or emergent issues, a gastroenterologist can be reached at any hour by calling [phone].   DIET:  We do recommend a small meal at first, but then you may proceed to your regular diet.  Drink plenty of fluids but you should avoid alcoholic beverages for 24 hours.  ACTIVITY:  You should plan to take it easy for the rest of today and you should NOT DRIVE or use heavy machinery  until tomorrow (because of the sedation medicines used during the test).    FOLLOW UP: Our staff will call the number listed on your records the next business day following your procedure to check on you and address any questions or concerns that you may have regarding the information given to you following your procedure. If we do not reach you, we will leave a message.  However, if you are feeling well and you are not experiencing any problems, there is no need to return our call.  We will assume that you have returned to your regular daily activities without incident.  If any biopsies were taken you will be contacted by phone or by letter within the next 1-3 weeks.  Please call us at [phone] if you have not heard about the biopsies in 3 weeks.    SIGNATURES/CONFIDENTIALITY: You and/or your care partner have signed paperwork which will be entered into your electronic medical record.  These signatures attest to the fact that that the information above on your After Visit Summary has been reviewed and is understood.  Full responsibility of the confidentiality of this discharge information lies with you and/or your care-partner.  Thank you for letting us take care of your healthcare needs today.

## 2018-03-19 NOTE — Op Note (Signed)
[facility] Patient Name: Craig Robertson Procedure Date: 03/19/2018 2:40 PM MRN: [id number] Endoscopist: Rachael Fee , MD Age: 52 Referring MD:  Date of Birth: 1966/06/07 Gender: Male Account #: [id number] Procedure:                Colonoscopy Indications:              Hematochezia Medicines:                Monitored Anesthesia Care Procedure:                Pre-Anesthesia Assessment:                           - Prior to the procedure, a History and Physical                            was performed, and patient medications and                            allergies were reviewed. The patient's tolerance of                            previous anesthesia was also reviewed. The risks                            and benefits of the procedure and the sedation                            options and risks were discussed with the patient.                            All questions were answered, and informed consent                            was obtained. Prior Anticoagulants: The patient has                            taken no previous anticoagulant or antiplatelet                            agents. ASA Grade Assessment: II - A patient with                            mild systemic disease. After reviewing the risks                            and benefits, the patient was deemed in                            satisfactory condition to undergo the procedure.                           After obtaining informed consent, the colonoscope  was passed under direct vision. Throughout the                            procedure, the patient's blood pressure, pulse, and                            oxygen saturations were monitored continuously. The                            Colonoscope was introduced through the anus and                            advanced to the the cecum, identified by                            appendiceal orifice and ileocecal valve. The                  colonoscopy was performed without difficulty. The                            patient tolerated the procedure well. The quality                            of the bowel preparation was good. The ileocecal                            valve, appendiceal orifice, and rectum were                            photographed. Scope In: 2:45:21 PM Scope Out: 2:53:53 PM Scope Withdrawal Time: 0 hours 5 minutes 34 seconds  Total Procedure Duration: 0 hours 8 minutes 32 seconds  Findings:                 Internal hemorrhoids were found. The hemorrhoids                            were medium-sized.                           The exam was otherwise without abnormality on                            direct and retroflexion views. Complications:            No immediate complications. Estimated blood loss:                            None. Estimated Blood Loss:     Estimated blood loss: none. Impression:               - Internal hemorrhoids.                           - The examination was otherwise normal on direct  and retroflexion views.                           - No polyps or cancers. Recommendation:           - Patient has a contact number available for                            emergencies. The signs and symptoms of potential                            delayed complications were discussed with the                            patient. Return to normal activities tomorrow.                            Written discharge instructions were provided to the                            patient.                           - Resume previous diet.                           - Continue present medications.                           - Repeat colonoscopy in 10 years for screening                            purposes.                           - Referral for in-office hemorrhoidal banding                            procedure for your bothersome (bleeding, itching)                             internal hemorrhoids. Rachael Feeaniel P Miel Wisener, MD 03/19/2018 2:56:47 PM This report has been signed electronically.

## 2018-03-19 NOTE — Progress Notes (Signed)
To PACU, VSS. Report to [email or web address] 

## 2018-03-20 ENCOUNTER — Telehealth: Payer: Self-pay

## 2018-03-20 NOTE — Telephone Encounter (Signed)
  Follow up Call-  Call back number 03/19/2018  Post procedure Call Back phone  # [phone]  Permission to leave phone message Yes  Some recent data might be hidden     Patient questions:  Do you have a fever, pain , or abdominal swelling? No. Pain Score  0 *  Have you tolerated food without any problems? Yes.    Have you been able to return to your normal activities? Yes.    Do you have any questions about your discharge instructions: Diet   No. Medications  No. Follow up visit  No.  Do you have questions or concerns about your Care? No.  Actions: * If pain score is 4 or above: No action needed, pain <4.

## 2018-03-20 NOTE — Telephone Encounter (Signed)
See procedure report pt needs an appt for banding Left message on machine to call back

## 2018-03-21 NOTE — Telephone Encounter (Signed)
The pt states he will call when he is ready to set up appt

## 2018-03-26 NOTE — Telephone Encounter (Signed)
Entry error

## 2018-04-04 ENCOUNTER — Encounter: Payer: 59 | Admitting: Family Medicine

## 2018-05-09 ENCOUNTER — Encounter: Payer: Self-pay | Admitting: Family Medicine

## 2018-05-09 ENCOUNTER — Other Ambulatory Visit: Payer: Self-pay | Admitting: Family Medicine

## 2018-05-09 ENCOUNTER — Ambulatory Visit (INDEPENDENT_AMBULATORY_CARE_PROVIDER_SITE_OTHER): Payer: 59 | Admitting: Family Medicine

## 2018-05-09 VITALS — BP 138/88 | Ht 70.0 in | Wt 259.4 lb

## 2018-05-09 DIAGNOSIS — E119 Type 2 diabetes mellitus without complications: Secondary | ICD-10-CM | POA: Diagnosis not present

## 2018-05-09 DIAGNOSIS — Z79899 Other long term (current) drug therapy: Secondary | ICD-10-CM

## 2018-05-09 DIAGNOSIS — N5201 Erectile dysfunction due to arterial insufficiency: Secondary | ICD-10-CM | POA: Diagnosis not present

## 2018-05-09 DIAGNOSIS — Z23 Encounter for immunization: Secondary | ICD-10-CM

## 2018-05-09 DIAGNOSIS — Z0000 Encounter for general adult medical examination without abnormal findings: Secondary | ICD-10-CM

## 2018-05-09 DIAGNOSIS — Z13220 Encounter for screening for lipoid disorders: Secondary | ICD-10-CM

## 2018-05-09 DIAGNOSIS — I10 Essential (primary) hypertension: Secondary | ICD-10-CM | POA: Diagnosis not present

## 2018-05-09 DIAGNOSIS — Z125 Encounter for screening for malignant neoplasm of prostate: Secondary | ICD-10-CM | POA: Diagnosis not present

## 2018-05-09 LAB — POCT GLYCOSYLATED HEMOGLOBIN (HGB A1C): HEMOGLOBIN A1C: 5.7 % — AB (ref 4.0–5.6)

## 2018-05-09 MED ORDER — ENALAPRIL MALEATE 10 MG PO TABS: 10.0000 mg | ORAL_TABLET | Freq: Every day | ORAL | 0 refills | Status: DC

## 2018-05-09 MED ORDER — VERAPAMIL HCL ER 180 MG PO CP24: ORAL_CAPSULE | ORAL | 1 refills | Status: DC

## 2018-05-09 MED ORDER — METFORMIN HCL 500 MG PO TABS: ORAL_TABLET | ORAL | 1 refills | Status: DC

## 2018-05-09 MED ORDER — GLUCOSE BLOOD VI STRP: ORAL_STRIP | 5 refills | Status: DC

## 2018-05-09 MED ORDER — ATORVASTATIN CALCIUM 40 MG PO TABS: 40.0000 mg | ORAL_TABLET | Freq: Every day | ORAL | 1 refills | Status: DC

## 2018-05-09 NOTE — Progress Notes (Signed)
Subjective:    Patient ID: Craig Robertson, male    DOB: 02-Jan-1966, 52 y.o.   MRN: [id number]  Diabetes  He presents for his follow-up diabetic visit. He has type 2 diabetes mellitus. Pertinent negatives for hypoglycemia include no headaches. Pertinent negatives for diabetes include no chest pain and no weakness.   The patient comes in today for a wellness visit.    A review of their health history was completed.  A review of medications was also completed.  Any needed refills; yes  Eating habits: health conscious  Falls/  MVA accidents in past few months: none  Regular exercise: no  Specialist pt sees on regular basis: just for hip once in a while  Preventative health issues were discussed.   Additional concerns: none Results for orders placed or performed in visit on 05/09/18  POCT HgB A1C  Result Value Ref Range   Hemoglobin A1C 5.7 (A) 4.0 - 5.6 %   HbA1c POC (<> result, manual entry)     HbA1c, POC (prediabetic range)     HbA1c, POC (controlled diabetic range)    Patient claims compliance with diabetes medication. No obvious side effects. Reports no substantial low sugar spells. Most numbers are generally in good range when checked fasting. Generally does not miss a dose of medication. Watching diabetic diet closely  Blood pressure medicine and blood pressure levels reviewed today with patient. Compliant with blood pressure medicine. States does not miss a dose. No obvious side effects. Blood pressure generally good when checked elsewhere. Watching salt intak impressione. Erectile dysfunction.  Ongoing need for medications.  Discussed.  Medications refilled potential side effects benefits discussed  Patient continues to take lipid medication regularly. No obvious side effects from it. Generally does not miss a dose. Prior blood work results are reviewed with patient. Patient continues to work on fat intake in diet  Most morn numbers ar e on the lowish around 100 or  so  Walking when possible   Review of Systems  Constitutional: Negative for activity change, appetite change and fever.  HENT: Negative for congestion and rhinorrhea.   Eyes: Negative for discharge.  Respiratory: Negative for cough and wheezing.   Cardiovascular: Negative for chest pain.  Gastrointestinal: Negative for abdominal pain, blood in stool and vomiting.  Genitourinary: Negative for difficulty urinating and frequency.  Musculoskeletal: Negative for neck pain.  Skin: Negative for rash.  Allergic/Immunologic: Negative for environmental allergies and food allergies.  Neurological: Negative for weakness and headaches.  Psychiatric/Behavioral: Negative for agitation.  All other systems reviewed and are negative.      Objective:   Physical Exam  Constitutional: He appears well-developed and well-nourished.  HENT:  Head: Normocephalic and atraumatic.  Right Ear: External ear normal.  Left Ear: External ear normal.  Nose: Nose normal.  Mouth/Throat: Oropharynx is clear and moist.  Eyes: Pupils are equal, round, and reactive to light. EOM are normal.  Neck: Normal range of motion. Neck supple. No thyromegaly present.  Cardiovascular: Normal rate, regular rhythm and normal heart sounds.  No murmur heard. Pulmonary/Chest: Effort normal and breath sounds normal. No respiratory distress. He has no wheezes.  Abdominal: Soft. Bowel sounds are normal. He exhibits no distension and no mass. There is no tenderness.  Genitourinary: Penis normal.  Musculoskeletal: Normal range of motion. He exhibits no edema.  Lymphadenopathy:    He has no cervical adenopathy.  Neurological: He is alert. He exhibits normal muscle tone.  Skin: Skin is warm and dry. No  erythema.  Psychiatric: He has a normal mood and affect. His behavior is normal. Judgment normal.  Vitals reviewed.         Assessment & Plan:  Impression wellness exam.  Colonoscopy already done.  Diet discussed.  Exercise  discussed vaccines discussed and recommended  2.  Hypertension.  Good control discussed maintain same meds  3.  Hyperlipidemia.  Prior blood work reviewed.  Compliance discussed maintain same meds graph #4 history of stroke clinically stable no new symptoms  5.  Erectile dysfunction.  Ongoing with need for medications medications refilled  6.  Type 2 diabetes.  Control good to maintain same  Medications refilled diet exercise discussed.  Tdap shot today.  6.  Prostate hypertrophy next did not help this.

## 2018-05-17 LAB — BASIC METABOLIC PANEL
BUN/Creatinine Ratio: 12 (ref 9–20)
BUN: 11 mg/dL (ref 6–24)
CALCIUM: 9.5 mg/dL (ref 8.7–10.2)
CO2: 23 mmol/L (ref 20–29)
Chloride: 99 mmol/L (ref 96–106)
Creatinine, Ser: 0.94 mg/dL (ref 0.76–1.27)
GFR calc Af Amer: 109 mL/min/{1.73_m2} (ref >59)
GFR, EST NON AFRICAN AMERICAN: 94 mL/min/{1.73_m2} (ref >59)
GLUCOSE: 87 mg/dL (ref 65–99)
Potassium: 4.3 mmol/L (ref 3.5–5.2)
Sodium: 138 mmol/L (ref 134–144)

## 2018-05-17 LAB — LIPID PANEL
CHOL/HDL RATIO: 2.2 ratio (ref 0.0–5.0)
Cholesterol, Total: 132 mg/dL (ref 100–199)
HDL: 59 mg/dL (ref >39)
LDL Calculated: 59 mg/dL (ref 0–99)
Triglycerides: 69 mg/dL (ref 0–149)
VLDL CHOLESTEROL CAL: 14 mg/dL (ref 5–40)

## 2018-05-17 LAB — HEPATIC FUNCTION PANEL
ALBUMIN: 4.4 g/dL (ref 3.5–5.5)
ALK PHOS: 74 IU/L (ref 39–117)
ALT: 22 IU/L (ref 0–44)
AST: 24 IU/L (ref 0–40)
Bilirubin Total: 0.5 mg/dL (ref 0.0–1.2)
Bilirubin, Direct: 0.15 mg/dL (ref 0.00–0.40)
Total Protein: 7.2 g/dL (ref 6.0–8.5)

## 2018-05-17 LAB — MICROALBUMIN / CREATININE URINE RATIO
CREATININE, UR: 79.7 mg/dL
MICROALB/CREAT RATIO: 3.9 mg/g{creat} (ref 0.0–30.0)
MICROALBUM., U, RANDOM: 3.1 ug/mL

## 2018-05-17 LAB — PSA: PROSTATE SPECIFIC AG, SERUM: 0.9 ng/mL (ref 0.0–4.0)

## 2018-05-18 ENCOUNTER — Encounter: Payer: Self-pay | Admitting: Family Medicine

## 2018-07-21 ENCOUNTER — Ambulatory Visit: Payer: 59 | Admitting: Family Medicine

## 2018-07-21 ENCOUNTER — Encounter: Payer: Self-pay | Admitting: Family Medicine

## 2018-07-21 ENCOUNTER — Telehealth: Payer: Self-pay | Admitting: Family Medicine

## 2018-07-21 VITALS — BP 150/88 | Temp 98.3°F | Ht 70.0 in | Wt 259.0 lb

## 2018-07-21 DIAGNOSIS — M5431 Sciatica, right side: Secondary | ICD-10-CM

## 2018-07-21 DIAGNOSIS — R300 Dysuria: Secondary | ICD-10-CM

## 2018-07-21 LAB — POCT URINALYSIS DIPSTICK
SPEC GRAV UA: 1.02 (ref 1.010–1.025)
pH, UA: 5 (ref 5.0–8.0)

## 2018-07-21 MED ORDER — HYDROCODONE-ACETAMINOPHEN 7.5-325 MG PO TABS: 1.0000 | ORAL_TABLET | ORAL | 0 refills | Status: DC | PRN

## 2018-07-21 MED ORDER — PREDNISONE 20 MG PO TABS: ORAL_TABLET | ORAL | 0 refills | Status: DC

## 2018-07-21 NOTE — Progress Notes (Signed)
   Subjective:    Patient ID: Craig Robertson, male    DOB: 1966-03-09, 52 y.o.   MRN: [id number]  Back Pain  This is a new problem. Episode onset: 4 days. Pain location: low back pain. Radiates to: right gluteal. The symptoms are aggravated by bending, position and twisting. Associated symptoms include dysuria. Pertinent negatives include no abdominal pain, chest pain, headaches or weakness. (Frequent urination) Treatments tried: otc pain medicine and back patches. The treatment provided no relief.  He relates onset of back pain right lower back radiates into the thigh region denies any weakness into the leg symptoms been going on past several days had previous surgery from Dr. Franky Machoabbell Results for orders placed or performed in visit on 07/21/18  POCT urinalysis dipstick  Result Value Ref Range   Color, UA     Clarity, UA     Glucose, UA     Bilirubin, UA     Ketones, UA     Spec Grav, UA 1.020 1.010 - 1.025   Blood, UA     pH, UA 5.0 5.0 - 8.0   Protein, UA     Urobilinogen, UA     Nitrite, UA     Leukocytes, UA     Appearance     Odor        Review of Systems  Constitutional: Negative for activity change.  HENT: Negative for congestion and rhinorrhea.   Respiratory: Negative for cough and shortness of breath.   Cardiovascular: Negative for chest pain.  Gastrointestinal: Negative for abdominal pain, diarrhea, nausea and vomiting.  Genitourinary: Positive for dysuria. Negative for hematuria.  Musculoskeletal: Positive for back pain.  Neurological: Negative for weakness and headaches.  Psychiatric/Behavioral: Negative for behavioral problems and confusion.       Objective:   Physical Exam Vitals signs reviewed.  Cardiovascular:     Rate and Rhythm: Normal rate and regular rhythm.     Heart sounds: Normal heart sounds. No murmur.  Pulmonary:     Effort: Pulmonary effort is normal.     Breath sounds: Normal breath sounds.  Lymphadenopathy:     Cervical: No cervical  adenopathy.  Neurological:     Mental Status: He is alert.  Psychiatric:        Behavior: Behavior normal.           Assessment & Plan:  Sciatica right leg Strength overall good Has a history of previous back surgery and disc problems Prednisone taper Hydrocodone as needed caution drowsiness Referral back to Dr. Franky Machoabbell Keep all regular follow-up visits with Dr. Brett CanalesSteve

## 2018-07-21 NOTE — Telephone Encounter (Signed)
Pt will need a MRI or CT scan to be sent with referral to neurosurgery  No record of scan within last 6 months  Please advise

## 2018-07-21 NOTE — Telephone Encounter (Signed)
I do not feel this patient would qualify for MRI he is only had the pain for 5 days  Dr. Franky Machoabbell did surgery on him last year. Patient would like to have a follow-up visit with Dr. Franky Machoabbell regarding his back Please request neurosurgery to see the patient for consultation on his back but let them know that I do not feel he would qualify for an MRI at this point  Thank you

## 2018-07-22 ENCOUNTER — Ambulatory Visit: Payer: 59 | Admitting: Family Medicine

## 2018-07-22 NOTE — Telephone Encounter (Signed)
Please inform the patient that his neurosurgeon office will not see the patient back unless he has a MRI Currently his insurance company will not approve an MRI unless he has back pain with sciatica for at least 6-12 weeks Therefore we cannot order MRI currently We just saw him the other day No I recommend he continue the medications and stretches I would recommend that he follow-up with Dr. Brett CanalesSteve in 4 to 6 weeks Follow-up with Dr. Brett CanalesSteve sooner if leg weakness or progressive troubles with the pain occurs When patient does follow-up if his leg is not improving over the next 6 weeks then consideration can be done at trying to get MRI approved

## 2018-07-22 NOTE — Telephone Encounter (Signed)
Called & spoke with Dr. Sueanne Margaritaabbell's office  Pt was last seen in their office 02/2015 & last had surgery with Dr. Franky Machoabbell 02/11/2015  It's been over 3 years therefore would be a new patient and will need a new scan  Please advise

## 2018-07-24 NOTE — Telephone Encounter (Signed)
Called & spoke with pt, explained message below, pt verbalized understanding and appointment with Dr. Brett CanalesSteve was scheduled for 09/01/18

## 2018-08-04 DIAGNOSIS — M7062 Trochanteric bursitis, left hip: Secondary | ICD-10-CM | POA: Insufficient documentation

## 2018-09-01 ENCOUNTER — Encounter: Payer: Self-pay | Admitting: Family Medicine

## 2018-09-01 ENCOUNTER — Ambulatory Visit (INDEPENDENT_AMBULATORY_CARE_PROVIDER_SITE_OTHER): Payer: Managed Care, Other (non HMO) | Admitting: Family Medicine

## 2018-09-01 VITALS — BP 134/92 | Ht 70.0 in | Wt 265.0 lb

## 2018-09-01 DIAGNOSIS — G8929 Other chronic pain: Secondary | ICD-10-CM

## 2018-09-01 DIAGNOSIS — M545 Low back pain, unspecified: Secondary | ICD-10-CM

## 2018-09-01 NOTE — Progress Notes (Signed)
   Subjective:    Patient ID: LIO GUESS, male    DOB: 1965-10-26, 53 y.o.   MRN: [id number]  Back Pain  This is a chronic problem. The current episode started more than 1 year ago. The pain is present in the lumbar spine. Treatments tried: surgery 3 years ago, prednisone.   Pt has left hip pain, followed by ortho specialist, pt noted to have small tear in muscle, PT has been consuted to f u   Pt states the lower vack pain has calmed down over;all Back overall is feeling better, took the med, med is gone    Review of Systems  Musculoskeletal: Positive for back pain.       Objective:   Physical Exam Alert active no acute distress except when moving.  Lungs clear.  Heart regular rhythm.  Low back painful to percussion left hip very painful with rotation.  No true straight leg raise.  Patient moving slowly states all pain focus in it       Assessment & Plan:  Impression progressive hip pain.  History of joint replacement.  Has scan done.  Told by orthopedist muscle tear.  Now due to go see physical therapy once again frustrated by it.  No true sciatic symptoms.  Ongoing low back pain.  Long discussion held.  I really do not think he needs seen a neurosurgeon at this time.  E needs to see if through with his hip pain get back on track.  Anti-inflammatory may help back.  Recommend no further work-up at this time as far as back

## 2018-09-02 ENCOUNTER — Telehealth: Payer: Self-pay | Admitting: Family Medicine

## 2018-09-02 ENCOUNTER — Other Ambulatory Visit: Payer: Self-pay

## 2018-09-02 MED ORDER — TADALAFIL 5 MG PO TABS: 5.0000 mg | ORAL_TABLET | Freq: Every day | ORAL | 5 refills | Status: DC | PRN

## 2018-09-02 NOTE — Telephone Encounter (Signed)
Fax from pharmacy requesting new script for Tadalafil 5 mg tablet. Take one tablet by mouth daily.

## 2018-09-02 NOTE — Telephone Encounter (Signed)
Refill sent to pharmacy.   

## 2018-09-02 NOTE — Telephone Encounter (Signed)
6 mo 

## 2018-09-03 ENCOUNTER — Telehealth: Payer: Self-pay | Admitting: Family Medicine

## 2018-09-03 NOTE — Telephone Encounter (Signed)
Actually at very last visit all his pain was consistent with hip and back and not sciatica and I advised him against an mri of the back and to hold off on the neurosurg ref for now

## 2018-09-03 NOTE — Telephone Encounter (Signed)
Please advise. Thank you

## 2018-09-03 NOTE — Telephone Encounter (Signed)
Pt needs a MRI or CT scan within 6 months to send with referral to neurosurgery  (referral is already in Epic, just need to order appropriate scan)   Please advise

## 2018-09-04 ENCOUNTER — Telehealth: Payer: Self-pay | Admitting: Family Medicine

## 2018-09-04 NOTE — Telephone Encounter (Signed)
Craig Robertson with [employer] called to request a Rx for CPAP supplies  Rx must contain pt's name, DOB, address, ph#, Insurance ID#, Dx code, and supplies needed as well as physician info & signature  Needed:  Tubing, mask, filters, water chamber  Please fax to [phone] & [facility] will send order to DME provider that can supply pt with what he needs  Questions - call [facility] [phone]

## 2018-09-04 NOTE — Telephone Encounter (Signed)
Script printed and awaiting signature. 

## 2018-09-04 NOTE — Telephone Encounter (Signed)
Please advise 

## 2018-09-05 NOTE — Telephone Encounter (Signed)
Cancelled referral to neurosurgery

## 2018-09-08 ENCOUNTER — Telehealth: Payer: Self-pay | Admitting: Family Medicine

## 2018-09-08 NOTE — Telephone Encounter (Signed)
This was sent in on 09/02/18 for a 6 month supply. Will call pharm at 9 when they open to see what is going on.

## 2018-09-08 NOTE — Telephone Encounter (Signed)
Pt is checking status of tadalafil (CIALIS) 5 MG tablet being refilled. Pt checked with pharmacy this weekend and they state they do not have it.   CB# [phone]

## 2018-09-08 NOTE — Telephone Encounter (Signed)
Prior authorization filled out and placed in Dr Soyla Dryer office for [employer]

## 2018-09-08 NOTE — Telephone Encounter (Signed)
Med needs PA 

## 2018-09-12 NOTE — Telephone Encounter (Signed)
Form faxed to [employer].

## 2018-09-16 NOTE — Telephone Encounter (Signed)
Patient is aware and will call the pharmacy to see if approved by insurance.

## 2018-09-18 ENCOUNTER — Telehealth: Payer: Self-pay | Admitting: Family Medicine

## 2018-09-18 NOTE — Telephone Encounter (Signed)
PA approved for Tadalafil 5 mg tablet. Authorization effective from 09/17/2018-09/18/2019. Contacted pharmacy to see if [facility] went through[facility] [place] Pharmacy stated it is still saying PA is required. Contacted patient to inform patient that medication has been approved. Pt informed that pharmacy has not received approval yet so he may want to keep trying to get pharmacy to run PA through. Will fax approval to pharmacy. Pt verbalized understanding.

## 2018-09-19 ENCOUNTER — Telehealth: Payer: Self-pay | Admitting: Family Medicine

## 2018-09-19 ENCOUNTER — Encounter: Payer: Self-pay | Admitting: Family Medicine

## 2018-09-19 NOTE — Telephone Encounter (Signed)
Craig Robertson with [facility] calling checking on status of CPAP supplies being ordered and the form that was faxed over being filled out and sent back with diagnosis and office visit notes from last 2 appts.   She states she called on Monday and talked with someone here but I do not see a note regarding Monday's call.   CB# [phone] Fax # [phone]

## 2018-09-24 NOTE — Telephone Encounter (Signed)
No formal form needed, just needs an Rx & OV notes  Pt is scheduled to see Dr. Brett Canales 09/29/2018 to discuss use & benefit of CPAP   Once OV note is ready it will be faxed with Rx for CPAP supplies   Pt's currently using an Auto CPAP with a pressure setting of 5-15 cm H2O

## 2018-09-29 ENCOUNTER — Ambulatory Visit: Payer: Managed Care, Other (non HMO) | Admitting: Family Medicine

## 2018-09-30 ENCOUNTER — Ambulatory Visit (INDEPENDENT_AMBULATORY_CARE_PROVIDER_SITE_OTHER): Payer: Managed Care, Other (non HMO) | Admitting: Family Medicine

## 2018-09-30 ENCOUNTER — Encounter: Payer: Self-pay | Admitting: Family Medicine

## 2018-09-30 DIAGNOSIS — G4733 Obstructive sleep apnea (adult) (pediatric): Secondary | ICD-10-CM

## 2018-09-30 NOTE — Progress Notes (Signed)
   Subjective:    Patient ID: Craig Robertson, male    DOB: 01/15/1966, 53 y.o.   MRN: [id number]  HPI  Patient is here today and is on a Cpap machine Qhs. He states he is having trouble with getting his supplies for it since a change in his insurance.  Pt switched insurance to [place], goes to [employer] forcpap supplies but they do not do   Three yrs , has used the cpap and it has definietely helped  Not as tired and worn out   Old records reviewed at length in presence of patient.  Old sleep study reviewed.  Forms needed for authorizing reviewed today in presence of patient.  Not snoring now, dong overall bete   Results for orders placed or performed in visit on 07/21/18  POCT urinalysis dipstick  Result Value Ref Range   Color, UA     Clarity, UA     Glucose, UA     Bilirubin, UA     Ketones, UA     Spec Grav, UA 1.020 1.010 - 1.025   Blood, UA     pH, UA 5.0 5.0 - 8.0   Protein, UA     Urobilinogen, UA     Nitrite, UA     Leukocytes, UA     Appearance     Odor       Review of Systems No headache, no major weight loss or weight gain, no chest pain no back pain abdominal pain no change in bowel habits complete ROS otherwise negative     Objective:   Physical Exam  Alert vitals stable, NAD. Blood pressure good on repeat. HEENT normal. Lungs clear. Heart regular rrr, ankles no edema    Assessment & Plan:  Impression obstructive sleep apnea.  Confirmed by sleep study at the start of 2017.  Compliant with interventions.  Helps tremendously in terms of daytime drowsiness.  Also with history of stroke and 9 diabetes and hypertension crucial to control this to reduce his long-term risk factors.  Uses it faithfully.  In need of chamber tubing and mask.  Will refill   Greater than 50% of this 25 minute face to face visit was spent in counseling and discussion and coordination of care regarding the above diagnosis/diagnosies

## 2018-10-10 NOTE — Telephone Encounter (Signed)
Faxed order to [facility] 10/08/2018 & filed

## 2018-10-20 ENCOUNTER — Telehealth: Payer: Self-pay | Admitting: Family Medicine

## 2018-10-20 NOTE — Telephone Encounter (Signed)
Error made appointment

## 2018-10-21 ENCOUNTER — Encounter: Payer: Self-pay | Admitting: Family Medicine

## 2018-10-21 ENCOUNTER — Other Ambulatory Visit: Payer: Self-pay

## 2018-10-21 ENCOUNTER — Ambulatory Visit (INDEPENDENT_AMBULATORY_CARE_PROVIDER_SITE_OTHER): Payer: Managed Care, Other (non HMO) | Admitting: Family Medicine

## 2018-10-21 VITALS — BP 148/92 | Temp 97.7°F | Wt 262.8 lb

## 2018-10-21 DIAGNOSIS — M545 Low back pain, unspecified: Secondary | ICD-10-CM

## 2018-10-21 DIAGNOSIS — G8929 Other chronic pain: Secondary | ICD-10-CM | POA: Diagnosis not present

## 2018-10-21 MED ORDER — NABUMETONE 750 MG PO TABS: ORAL_TABLET | ORAL | 2 refills | Status: DC

## 2018-10-21 MED ORDER — TIZANIDINE HCL 4 MG PO CAPS: ORAL_CAPSULE | ORAL | 2 refills | Status: DC
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
## 2018-10-27 ENCOUNTER — Other Ambulatory Visit: Payer: Self-pay | Admitting: Family Medicine

## 2018-11-28 ENCOUNTER — Other Ambulatory Visit: Payer: Self-pay | Admitting: *Deleted

## 2018-11-28 ENCOUNTER — Other Ambulatory Visit: Payer: Self-pay | Admitting: Family Medicine

## 2018-11-28 MED ORDER — ENALAPRIL MALEATE 10 MG PO TABS: 10.0000 mg | ORAL_TABLET | Freq: Every day | ORAL | 0 refills | Status: DC

## 2018-12-01 ENCOUNTER — Other Ambulatory Visit: Payer: Self-pay | Admitting: Family Medicine

## 2019-01-19 ENCOUNTER — Other Ambulatory Visit: Payer: Self-pay | Admitting: Family Medicine

## 2019-01-30 ENCOUNTER — Telehealth: Payer: Self-pay | Admitting: Family Medicine

## 2019-01-30 NOTE — Telephone Encounter (Signed)
Medication was sent in on 12/30/2018 for 90 days supply. Pt states the pharmacy has not let him know that it was there. Pt informed to call pharmacy and see if they have it ready, if not call us back and we will resend it. Pt verbalized understanding.

## 2019-01-30 NOTE — Telephone Encounter (Signed)
Patient is requesting refill on atorvastatin 40 mg called into [facility] [address]

## 2019-03-04 ENCOUNTER — Other Ambulatory Visit: Payer: Self-pay | Admitting: Family Medicine

## 2019-04-06 ENCOUNTER — Encounter: Payer: Self-pay | Admitting: Family Medicine

## 2019-04-06 ENCOUNTER — Ambulatory Visit (INDEPENDENT_AMBULATORY_CARE_PROVIDER_SITE_OTHER): Payer: Managed Care, Other (non HMO) | Admitting: Family Medicine

## 2019-04-06 ENCOUNTER — Other Ambulatory Visit: Payer: Self-pay

## 2019-04-06 VITALS — BP 138/90 | Temp 96.9°F | Wt 255.6 lb

## 2019-04-06 DIAGNOSIS — R0789 Other chest pain: Secondary | ICD-10-CM | POA: Diagnosis not present

## 2019-04-06 MED ORDER — NAPROXEN 500 MG PO TABS: ORAL_TABLET | ORAL | 0 refills | Status: DC

## 2019-04-06 MED ORDER — HYDROCODONE-ACETAMINOPHEN 5-325 MG PO TABS: ORAL_TABLET | ORAL | 0 refills | Status: DC
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
## 2019-04-28 ENCOUNTER — Other Ambulatory Visit: Payer: Self-pay | Admitting: Family Medicine

## 2019-05-19 ENCOUNTER — Telehealth: Payer: Self-pay | Admitting: Family Medicine

## 2019-05-19 NOTE — Telephone Encounter (Signed)
Pharmacy requesting refill on Enalapril 10 mg tablet. Take one tablet po daily. Pt last seen 04/06/2019 for chest wall pain. Please advise. Thank you

## 2019-05-20 MED ORDER — ENALAPRIL MALEATE 10 MG PO TABS: 10.0000 mg | ORAL_TABLET | Freq: Every day | ORAL | 0 refills | Status: DC

## 2019-05-20 NOTE — Telephone Encounter (Signed)
Prescription sent electronically to pharmacy. Patient advised he needs office visit.

## 2019-05-20 NOTE — Telephone Encounter (Signed)
Ok times one  Call pt time for chronic f u on htn and sugar and other issues

## 2019-05-24 ENCOUNTER — Other Ambulatory Visit: Payer: Self-pay | Admitting: Family Medicine

## 2019-05-25 NOTE — Telephone Encounter (Signed)
Call n sched chronic visir none for a yr best I can tell, then may ref times one

## 2019-05-25 NOTE — Telephone Encounter (Signed)
Please call and schedule visit and then route back to nurses 

## 2019-05-26 NOTE — Telephone Encounter (Signed)
Patient schedule follow up visit for 11/10

## 2019-06-02 ENCOUNTER — Ambulatory Visit (INDEPENDENT_AMBULATORY_CARE_PROVIDER_SITE_OTHER): Payer: Managed Care, Other (non HMO) | Admitting: Family Medicine

## 2019-06-02 ENCOUNTER — Other Ambulatory Visit: Payer: Self-pay

## 2019-06-02 DIAGNOSIS — Z13220 Encounter for screening for lipoid disorders: Secondary | ICD-10-CM

## 2019-06-02 DIAGNOSIS — Z125 Encounter for screening for malignant neoplasm of prostate: Secondary | ICD-10-CM

## 2019-06-02 DIAGNOSIS — Z79899 Other long term (current) drug therapy: Secondary | ICD-10-CM

## 2019-06-02 DIAGNOSIS — I10 Essential (primary) hypertension: Secondary | ICD-10-CM | POA: Diagnosis not present

## 2019-06-02 DIAGNOSIS — E119 Type 2 diabetes mellitus without complications: Secondary | ICD-10-CM | POA: Diagnosis not present

## 2019-06-02 MED ORDER — ENALAPRIL MALEATE 10 MG PO TABS: 10.0000 mg | ORAL_TABLET | Freq: Every day | ORAL | 1 refills | Status: DC

## 2019-06-02 MED ORDER — METFORMIN HCL 500 MG PO TABS: ORAL_TABLET | ORAL | 1 refills | Status: DC

## 2019-06-02 MED ORDER — ATORVASTATIN CALCIUM 40 MG PO TABS: 40.0000 mg | ORAL_TABLET | Freq: Every day | ORAL | 1 refills | Status: DC

## 2019-06-02 MED ORDER — VERAPAMIL HCL ER 180 MG PO CP24: 180.0000 mg | ORAL_CAPSULE | Freq: Two times a day (BID) | ORAL | 0 refills | Status: DC

## 2019-06-02 NOTE — Progress Notes (Signed)
   Subjective:    Patient ID: Craig Robertson, male    DOB: July 07, 1966, 53 y.o.   MRN: [id number]  Diabetes He presents for his follow-up diabetic visit. He has type 2 diabetes mellitus. Risk factors for coronary artery disease include diabetes mellitus, hypertension and dyslipidemia. Current diabetic treatment includes oral agent (monotherapy). He is compliant with treatment all of the time. His weight is stable. He is following a diabetic diet.    FBS yesterday am 85  Virtual Visit via Video Note  I connected with MONTAVIS SCHUBRING on 06/02/19 at 10:00 AM EST by a video enabled telemedicine application and verified that I am speaking with the correct person using two identifiers.  Location: Patient: home Provider: office   I discussed the limitations of evaluation and management by telemedicine and the availability of in person appointments. The patient expressed understanding and agreed to proceed.  History of Present Illness:    Observations/Objective:   Assessment and Plan:   Follow Up Instructions:    I discussed the assessment and treatment plan with the patient. The patient was provided an opportunity to ask questions and all were answered. The patient agreed with the plan and demonstrated an understanding of the instructions.   The patient was advised to call back or seek an in-person evaluation if the symptoms worsen or if the condition fails to improve as anticipated.  I provided 25 minutes of non-face-to-face time during this encounter.  Blood pressure medicine and blood pressure levels reviewed today with patient. Compliant with blood pressure medicine. States does not miss a dose. No obvious side effects. Blood pressure generally good when checked elsewhere. Watching salt intake.   Patient continues to take lipid medication regularly. No obvious side effects from it. Generally does not miss a dose. Prior blood work results are reviewed with patient. Patient  continues to work on fat intake in diet  Patient claims compliance with diabetes medication. No obvious side effects. Reports no substantial low sugar spells. Most numbers are generally in good range when checked fasting. Generally does not miss a dose of medication. Watching diabetic diet closely      Review of Systems No headache, no major weight loss or weight gain, no chest pain no back pain abdominal pain no change in bowel habits complete ROS otherwise negative     Objective:   Physical Exam Virtual       Assessment & Plan:  Impression type 2 diabetes.  Exact control uncertain await blood work results compliance discussed medications refilled  2.  Hypertension.  Blood pressure is reasonably good maintain same meds compliance Discussed   Hyperlipidemia Status Uncertain Need to Check Blood Work Rationale Discussed  4.  Regular Screenings Has Also Discussed Time for Yearly Prostate Test Renal Function Etc.  Further Recommendation Post Results Diet Exercise Discussed Medications Refilled Follow-Up in 6 Months

## 2019-06-07 ENCOUNTER — Encounter: Payer: Self-pay | Admitting: Family Medicine

## 2019-06-10 LAB — LIPID PANEL
Chol/HDL Ratio: 2.5 ratio (ref 0.0–5.0)
Cholesterol, Total: 126 mg/dL (ref 100–199)
HDL: 51 mg/dL (ref >39)
LDL Chol Calc (NIH): 63 mg/dL (ref 0–99)
Triglycerides: 54 mg/dL (ref 0–149)
VLDL Cholesterol Cal: 12 mg/dL (ref 5–40)

## 2019-06-10 LAB — HEPATIC FUNCTION PANEL
ALT: 24 IU/L (ref 0–44)
AST: 25 IU/L (ref 0–40)
Albumin: 4.4 g/dL (ref 3.8–4.9)
Alkaline Phosphatase: 76 IU/L (ref 39–117)
Bilirubin Total: 0.7 mg/dL (ref 0.0–1.2)
Bilirubin, Direct: 0.15 mg/dL (ref 0.00–0.40)
Total Protein: 7.5 g/dL (ref 6.0–8.5)

## 2019-06-10 LAB — HEMOGLOBIN A1C
Est. average glucose Bld gHb Est-mCnc: 126 mg/dL
Hgb A1c MFr Bld: 6 % — ABNORMAL HIGH (ref 4.8–5.6)

## 2019-06-10 LAB — BASIC METABOLIC PANEL
BUN/Creatinine Ratio: 14 (ref 9–20)
BUN: 13 mg/dL (ref 6–24)
CO2: 23 mmol/L (ref 20–29)
Calcium: 9.5 mg/dL (ref 8.7–10.2)
Chloride: 101 mmol/L (ref 96–106)
Creatinine, Ser: 0.94 mg/dL (ref 0.76–1.27)
GFR calc Af Amer: 108 mL/min/{1.73_m2} (ref >59)
GFR calc non Af Amer: 93 mL/min/{1.73_m2} (ref >59)
Glucose: 91 mg/dL (ref 65–99)
Potassium: 4.3 mmol/L (ref 3.5–5.2)
Sodium: 140 mmol/L (ref 134–144)

## 2019-06-10 LAB — PSA: Prostate Specific Ag, Serum: 0.9 ng/mL (ref 0.0–4.0)

## 2019-06-10 LAB — MICROALBUMIN / CREATININE URINE RATIO
Creatinine, Urine: 104.6 mg/dL
Microalb/Creat Ratio: 8 mg/g creat (ref 0–29)
Microalbumin, Urine: 8.8 ug/mL

## 2019-06-14 ENCOUNTER — Encounter: Payer: Self-pay | Admitting: Family Medicine

## 2019-08-13 ENCOUNTER — Other Ambulatory Visit: Payer: Self-pay | Admitting: Family Medicine

## 2019-08-26 ENCOUNTER — Encounter: Payer: Self-pay | Admitting: Family Medicine

## 2019-09-21 ENCOUNTER — Telehealth: Payer: Self-pay | Admitting: Family Medicine

## 2019-11-09 ENCOUNTER — Other Ambulatory Visit: Payer: Self-pay | Admitting: Family Medicine

## 2020-01-08 ENCOUNTER — Telehealth: Payer: Self-pay | Admitting: Family Medicine

## 2020-01-08 NOTE — Telephone Encounter (Signed)
Pt called into office. Pt states he has had sinus infection for about 2 weeks. Dark yellow mucus, tightness in chest from coughing, nose stopped up/runny nose, dry cough. Pt has tried Afrin nasal spray, OTC head/cold med and Dayquil. Due to no availability, pt unable to be seen today. Did encourage Urgent Care but pt would like message sent. Please advise.thank you.

## 2020-01-08 NOTE — Telephone Encounter (Signed)
Needs see urgent care if can't wait, otherwise can see on Monday. Thx. Dr. Ladona Ridgel

## 2020-01-08 NOTE — Telephone Encounter (Signed)
Pt contacted and states that he will go Urgent Care. Pt states he will call back Monday if no relief from Urgent Care

## 2020-01-12 ENCOUNTER — Other Ambulatory Visit: Payer: Self-pay | Admitting: Family Medicine

## 2020-01-12 NOTE — Telephone Encounter (Signed)
Scheduled 7/14   Please let pt know if labs are needed.

## 2020-01-12 NOTE — Telephone Encounter (Signed)
Needs cbc, cmp, lipids, a1c. Thx. Dr. Ladona Ridgel

## 2020-01-12 NOTE — Telephone Encounter (Signed)
Please contact patient to set up appt; then may route back to nurses. Thank you  

## 2020-01-28 ENCOUNTER — Telehealth: Payer: Self-pay | Admitting: Family Medicine

## 2020-01-28 NOTE — Telephone Encounter (Signed)
Patient does not need PA for medication- Medication has quantity limits for a certain number of tablets during a certain time frame and he had tried to fill it too early but med can be filled now.  Patient notified and verbalized understanding.

## 2020-01-28 NOTE — Telephone Encounter (Signed)
Pt is needing PA on tadalafil (CIALIS) 5 MG tablet

## 2020-02-03 ENCOUNTER — Telehealth: Payer: Self-pay | Admitting: Family Medicine

## 2020-02-03 ENCOUNTER — Encounter: Payer: Self-pay | Admitting: Family Medicine

## 2020-02-03 ENCOUNTER — Ambulatory Visit (INDEPENDENT_AMBULATORY_CARE_PROVIDER_SITE_OTHER): Payer: Managed Care, Other (non HMO) | Admitting: Family Medicine

## 2020-02-03 ENCOUNTER — Other Ambulatory Visit: Payer: Self-pay

## 2020-02-03 VITALS — BP 132/88 | HR 73 | Temp 97.7°F | Ht 70.0 in | Wt 251.0 lb

## 2020-02-03 DIAGNOSIS — E119 Type 2 diabetes mellitus without complications: Secondary | ICD-10-CM

## 2020-02-03 DIAGNOSIS — Z8673 Personal history of transient ischemic attack (TIA), and cerebral infarction without residual deficits: Secondary | ICD-10-CM

## 2020-02-03 DIAGNOSIS — R079 Chest pain, unspecified: Secondary | ICD-10-CM

## 2020-02-03 DIAGNOSIS — Z125 Encounter for screening for malignant neoplasm of prostate: Secondary | ICD-10-CM

## 2020-02-03 DIAGNOSIS — I10 Essential (primary) hypertension: Secondary | ICD-10-CM | POA: Diagnosis not present

## 2020-02-03 DIAGNOSIS — R0602 Shortness of breath: Secondary | ICD-10-CM

## 2020-02-03 LAB — POCT GLYCOSYLATED HEMOGLOBIN (HGB A1C): Hemoglobin A1C: 5.3 % (ref 4.0–5.6)

## 2020-02-03 MED ORDER — METFORMIN HCL 500 MG PO TABS: ORAL_TABLET | ORAL | 1 refills | Status: DC

## 2020-02-03 MED ORDER — ALBUTEROL SULFATE HFA 108 (90 BASE) MCG/ACT IN AERS: 2.0000 | INHALATION_SPRAY | Freq: Four times a day (QID) | RESPIRATORY_TRACT | 1 refills | Status: DC | PRN

## 2020-02-03 NOTE — Telephone Encounter (Signed)
We can do his dm foot exam on next visit.  Just pls put in opthomologist for eye exam. Thx. Dr. Ladona Ridgel

## 2020-02-03 NOTE — Telephone Encounter (Signed)
Pt has not had either in the past year. States he will make eye appt himself but has never seen a foot doctor. Do you want to do referral for diabetic foot exam?

## 2020-02-03 NOTE — Progress Notes (Signed)
Patient ID: Craig Robertson, male    DOB: Nov 24, 1965, 54 y.o.   MRN: [id number]   Chief Complaint  Patient presents with  . Diabetes   Subjective:    HPI  Cc- dm2, htn, hld and med checkup.   Dm2- Compliant with medications, but stating was only taking 1 tablet of 553m metformin daily.  Was prescribed 2 tab p.o. bid.  Having too much gi upset.  Has been taking 1 tab daily for over a year. Checking blood glucose.   Not seeing any high or low numbers.  Denies polyuria or polydipsia.   Hb a1c- 5.3  Pt states he has concerns about his breathing. Using albuterol inhaler that is expired. Started a few months ago. No coughing, fever, URI symptoms.  Chest pain at times, constant center of chest. Feeling at times can't get a deep breath.  Sometimes using albuterol and it helps. No formal dx of asthma or lung disease. Feeling this for weeks to months.  Worse with heat and exertion. Feeling needing the albuterol prn. Last ekg 2018. Never smoker. Brother with prostate cancer.  Father passed with leukemia.- 552yrs old.   Results for orders placed or performed in visit on 02/03/20  CBC  Result Value Ref Range   WBC 5.2 3.4 - 10.8 x10E3/uL   RBC 4.39 4.14 - 5.80 x10E6/uL   Hemoglobin 13.0 13.0 - 17.7 g/dL   Hematocrit 38.4 37.5 - 51.0 %   MCV 88 79 - 97 fL   MCH 29.6 26.6 - 33.0 pg   MCHC 33.9 31 - 35 g/dL   RDW 13.4 11.6 - 15.4 %   Platelets 222 150 - 450 x10E3/uL  CMP14+EGFR  Result Value Ref Range   Glucose 91 65 - 99 mg/dL   BUN 13 6 - 24 mg/dL   Creatinine, Ser 1.03 0.76 - 1.27 mg/dL   GFR calc non Af Amer 83 >59 mL/min/1.73   GFR calc Af Amer 96 >59 mL/min/1.73   BUN/Creatinine Ratio 13 9 - 20   Sodium 142 134 - 144 mmol/L   Potassium 3.9 3.5 - 5.2 mmol/L   Chloride 104 96 - 106 mmol/L   CO2 24 20 - 29 mmol/L   Calcium 9.6 8.7 - 10.2 mg/dL   Total Protein 7.7 6.0 - 8.5 g/dL   Albumin 4.5 3.8 - 4.9 g/dL   Globulin, Total 3.2 1.5 - 4.5 g/dL   Albumin/Globulin  Ratio 1.4 1.2 - 2.2   Bilirubin Total 0.5 0.0 - 1.2 mg/dL   Alkaline Phosphatase 78 48 - 121 IU/L   AST 27 0 - 40 IU/L   ALT 28 0 - 44 IU/L  Lipid panel  Result Value Ref Range   Cholesterol, Total 137 100 - 199 mg/dL   Triglycerides 60 0 - 149 mg/dL   HDL 53 >39 mg/dL   VLDL Cholesterol Cal 13 5 - 40 mg/dL   LDL Chol Calc (NIH) 71 0 - 99 mg/dL   Chol/HDL Ratio 2.6 0.0 - 5.0 ratio  Microalbumin, urine  Result Value Ref Range   Microalbumin, Urine 17.9 Not Estab. ug/mL  PSA  Result Value Ref Range   Prostate Specific Ag, Serum 1.3 0.0 - 4.0 ng/mL  POCT glycosylated hemoglobin (Hb A1C)  Result Value Ref Range   Hemoglobin A1C 5.3 4.0 - 5.6 %   HbA1c POC (<> result, manual entry)     HbA1c, POC (prediabetic range)     HbA1c, POC (controlled diabetic range)  Medical History Anthony has a past medical history of Arthritis, Complication of anesthesia, Diabetes mellitus without complication ([place]), Dyspnea, ED (erectile dysfunction), History of hiatal hernia, Hyperlipidemia, Hypertension, Low testosterone, Sleep apnea, and Stroke ([place]) (2015).   Outpatient Encounter Medications as of 02/03/2020  Medication Sig  . albuterol (VENTOLIN HFA) 108 (90 Base) MCG/ACT inhaler Inhale 2 puffs into the lungs every 6 (six) hours as needed for wheezing or shortness of breath.  Marland Kitchen aspirin 81 MG chewable tablet Chew 1 tablet (81 mg total) by mouth 2 (two) times daily.  Marland Kitchen atorvastatin (LIPITOR) 40 MG tablet TAKE 1 TABLET BY MOUTH EVERY DAY  . blood glucose meter kit and supplies KIT Dispense based on patient and insurance preference. Use up to test glucose once daily. ICD 10 code E11.9.  . enalapril (VASOTEC) 10 MG tablet Take 1 tablet (10 mg total) by mouth daily.  Marland Kitchen HYDROcodone-acetaminophen (NORCO/VICODIN) 5-325 MG tablet Take one tablet po every 4-6 hrs prn pain  . metFORMIN (GLUCOPHAGE) 500 MG tablet 1 tab p.o. daily with meal.  . nabumetone (RELAFEN) 750 MG tablet Take one tablet by mouth  twice daily as needed for pain  . ONETOUCH VERIO test strip TEST SUGAR DAILY  . Pseudoephedrine-Acetaminophen (SINUS PO) Take 2 tablets by mouth daily as needed (sinus).  . tadalafil (CIALIS) 5 MG tablet TAKE 1 TABLET BY MOUTH DAILY AS NEEDED FOR ERECTILE DYSFUNCTION  . verapamil (VERELAN PM) 180 MG 24 hr capsule TAKE 1 CAPSULE BY MOUTH TWICE A DAY  . [DISCONTINUED] albuterol (VENTOLIN HFA) 108 (90 Base) MCG/ACT inhaler Inhale into the lungs every 6 (six) hours as needed for wheezing or shortness of breath.  . [DISCONTINUED] HYDROcodone-acetaminophen (NORCO) 7.5-325 MG tablet Take 1 tablet by mouth every 4 (four) hours as needed (breakthrough pain).  . [DISCONTINUED] ketorolac (TORADOL) 10 MG tablet   . [DISCONTINUED] metFORMIN (GLUCOPHAGE) 500 MG tablet 2 tablets twice a day  . [DISCONTINUED] naproxen (NAPROSYN) 500 MG tablet Take one tablet po BID with food  . [DISCONTINUED] tiZANidine (ZANAFLEX) 4 MG capsule Take one tablet three times a day as needed for pain   Facility-Administered Encounter Medications as of 02/03/2020  Medication  . 0.9 %  sodium chloride infusion     Review of Systems  Constitutional: Negative for chills and fever.  HENT: Negative for congestion, rhinorrhea and sore throat.   Respiratory: Positive for shortness of breath. Negative for cough, chest tightness and wheezing.   Cardiovascular: Positive for chest pain. Negative for leg swelling.  Gastrointestinal: Negative for abdominal pain, diarrhea, nausea and vomiting.  Genitourinary: Negative for dysuria and frequency.  Skin: Negative for rash.  Neurological: Negative for dizziness, weakness and headaches.     Vitals BP 132/88   Pulse 73   Temp 97.7 F (36.5 C)   Ht 5' 10"  (1.778 m)   Wt 251 lb (113.9 kg)   SpO2 99%   BMI 36.01 kg/m   Objective:   Physical Exam Vitals and nursing note reviewed.  Constitutional:      General: He is not in acute distress.    Appearance: Normal appearance. He is not  ill-appearing.  HENT:     Head: Normocephalic.  Eyes:     Extraocular Movements: Extraocular movements intact.     Conjunctiva/sclera: Conjunctivae normal.     Pupils: Pupils are equal, round, and reactive to light.  Cardiovascular:     Rate and Rhythm: Normal rate and regular rhythm.     Pulses: Normal pulses.  Heart sounds: Normal heart sounds. No murmur heard.   Pulmonary:     Effort: Pulmonary effort is normal. No respiratory distress.     Breath sounds: Normal breath sounds. No wheezing, rhonchi or rales.  Musculoskeletal:        General: Normal range of motion.     Right lower leg: No edema.     Left lower leg: No edema.  Skin:    General: Skin is warm and dry.     Findings: No rash.  Neurological:     General: No focal deficit present.     Mental Status: He is alert and oriented to person, place, and time.     Cranial Nerves: No cranial nerve deficit.  Psychiatric:        Mood and Affect: Mood normal.        Behavior: Behavior normal.        Thought Content: Thought content normal.        Judgment: Judgment normal.      Assessment and Plan   1. Diabetes mellitus without complication (HCC) - POCT glycosylated hemoglobin (Hb A1C) - CBC - CMP14+EGFR - Lipid panel - Microalbumin, urine - metFORMIN (GLUCOPHAGE) 500 MG tablet; 1 tab p.o. daily with meal.  Dispense: 90 tablet; Refill: 1  2. Screening PSA (prostate specific antigen) - PSA  3. Chest pain, unspecified type - EKG 12-Lead - albuterol (VENTOLIN HFA) 108 (90 Base) MCG/ACT inhaler; Inhale 2 puffs into the lungs every 6 (six) hours as needed for wheezing or shortness of breath.  Dispense: 18 g; Refill: 1 - Ambulatory referral to Pulmonology  4. Essential hypertension, benign  5. History of cardioembolic cerebrovascular accident (CVA)  6. Shortness of breath - albuterol (VENTOLIN HFA) 108 (90 Base) MCG/ACT inhaler; Inhale 2 puffs into the lungs every 6 (six) hours as needed for wheezing or shortness  of breath.  Dispense: 18 g; Refill: 1 - Ambulatory referral to Pulmonology   Sob/chest pain-  Stable, intermittent. ekg- normal sinus rhythm 63 bpm. No st elevation or ischemic changes. Unchanged compared to previous.  will refer to pulm for pfts.  Gave albuterol to use prn.  HTN- subopitmal. Stable. Watch salt intake. Cont meds and recheck on next visit.  Call or rto if worsening chest pain or sob. Pt in agreement.   Ordered labs today.  Pt to get this week.  DM2- controlled, stable, cont meds.  -metfromin 525m 1 tab daily with meal.   F/u 662mor prn.

## 2020-02-05 LAB — CMP14+EGFR
ALT: 28 IU/L (ref 0–44)
AST: 27 IU/L (ref 0–40)
Albumin/Globulin Ratio: 1.4 (ref 1.2–2.2)
Albumin: 4.5 g/dL (ref 3.8–4.9)
Alkaline Phosphatase: 78 IU/L (ref 48–121)
BUN/Creatinine Ratio: 13 (ref 9–20)
BUN: 13 mg/dL (ref 6–24)
Bilirubin Total: 0.5 mg/dL (ref 0.0–1.2)
CO2: 24 mmol/L (ref 20–29)
Calcium: 9.6 mg/dL (ref 8.7–10.2)
Chloride: 104 mmol/L (ref 96–106)
Creatinine, Ser: 1.03 mg/dL (ref 0.76–1.27)
GFR calc Af Amer: 96 mL/min/{1.73_m2} (ref >59)
GFR calc non Af Amer: 83 mL/min/{1.73_m2} (ref >59)
Globulin, Total: 3.2 g/dL (ref 1.5–4.5)
Glucose: 91 mg/dL (ref 65–99)
Potassium: 3.9 mmol/L (ref 3.5–5.2)
Sodium: 142 mmol/L (ref 134–144)
Total Protein: 7.7 g/dL (ref 6.0–8.5)

## 2020-02-05 LAB — CBC
Hematocrit: 38.4 % (ref 37.5–51.0)
Hemoglobin: 13 g/dL (ref 13.0–17.7)
MCH: 29.6 pg (ref 26.6–33.0)
MCHC: 33.9 g/dL (ref 31.5–35.7)
MCV: 88 fL (ref 79–97)
Platelets: 222 10*3/uL (ref 150–450)
RBC: 4.39 x10E6/uL (ref 4.14–5.80)
RDW: 13.4 % (ref 11.6–15.4)
WBC: 5.2 10*3/uL (ref 3.4–10.8)

## 2020-02-05 LAB — LIPID PANEL
Chol/HDL Ratio: 2.6 ratio (ref 0.0–5.0)
Cholesterol, Total: 137 mg/dL (ref 100–199)
HDL: 53 mg/dL (ref >39)
LDL Chol Calc (NIH): 71 mg/dL (ref 0–99)
Triglycerides: 60 mg/dL (ref 0–149)
VLDL Cholesterol Cal: 13 mg/dL (ref 5–40)

## 2020-02-05 LAB — MICROALBUMIN, URINE: Microalbumin, Urine: 17.9 ug/mL

## 2020-02-05 LAB — PSA: Prostate Specific Ag, Serum: 1.3 ng/mL (ref 0.0–4.0)

## 2020-02-07 ENCOUNTER — Encounter: Payer: Self-pay | Admitting: Family Medicine

## 2020-02-08 ENCOUNTER — Other Ambulatory Visit: Payer: Self-pay | Admitting: *Deleted

## 2020-02-08 MED ORDER — VERAPAMIL HCL ER 180 MG PO CP24: 180.0000 mg | ORAL_CAPSULE | Freq: Two times a day (BID) | ORAL | 0 refills | Status: DC

## 2020-02-10 ENCOUNTER — Encounter: Payer: Self-pay | Admitting: Family Medicine

## 2020-02-23 DIAGNOSIS — Z029 Encounter for administrative examinations, unspecified: Secondary | ICD-10-CM

## 2020-03-02 ENCOUNTER — Other Ambulatory Visit: Payer: Self-pay

## 2020-03-02 MED ORDER — ENALAPRIL MALEATE 10 MG PO TABS: 10.0000 mg | ORAL_TABLET | Freq: Every day | ORAL | 1 refills | Status: DC

## 2020-03-17 ENCOUNTER — Other Ambulatory Visit (HOSPITAL_COMMUNITY)
Admission: RE | Admit: 2020-03-17 | Discharge: 2020-03-17 | Disposition: A | Discharge status: Home / Self Care | Payer: Managed Care, Other (non HMO) | Source: Ambulatory Visit | Attending: Internal Medicine | Admitting: Internal Medicine

## 2020-03-17 ENCOUNTER — Ambulatory Visit (INDEPENDENT_AMBULATORY_CARE_PROVIDER_SITE_OTHER): Payer: Managed Care, Other (non HMO) | Admitting: Internal Medicine

## 2020-03-17 ENCOUNTER — Ambulatory Visit (HOSPITAL_COMMUNITY)
Admission: RE | Admit: 2020-03-17 | Discharge: 2020-03-17 | Disposition: A | Discharge status: Home / Self Care | Payer: Managed Care, Other (non HMO) | Source: Ambulatory Visit | Attending: Internal Medicine | Admitting: Internal Medicine

## 2020-03-17 ENCOUNTER — Other Ambulatory Visit: Payer: Self-pay

## 2020-03-17 ENCOUNTER — Encounter: Payer: Self-pay | Admitting: Internal Medicine

## 2020-03-17 DIAGNOSIS — R0609 Other forms of dyspnea: Secondary | ICD-10-CM

## 2020-03-17 DIAGNOSIS — R0600 Dyspnea, unspecified: Secondary | ICD-10-CM

## 2020-03-17 DIAGNOSIS — I10 Essential (primary) hypertension: Secondary | ICD-10-CM | POA: Diagnosis not present

## 2020-03-17 LAB — CBC WITH DIFFERENTIAL/PLATELET
Abs Immature Granulocytes: 0.01 10*3/uL (ref 0.00–0.07)
Basophils Absolute: 0.1 10*3/uL (ref 0.0–0.1)
Basophils Relative: 1 %
Eosinophils Absolute: 0.2 10*3/uL (ref 0.0–0.5)
Eosinophils Relative: 3 %
HCT: 40.4 % (ref 39.0–52.0)
Hemoglobin: 12.8 g/dL — ABNORMAL LOW (ref 13.0–17.0)
Immature Granulocytes: 0 %
Lymphocytes Relative: 39 %
Lymphs Abs: 2.1 10*3/uL (ref 0.7–4.0)
MCH: 29.3 pg (ref 26.0–34.0)
MCHC: 31.7 g/dL (ref 30.0–36.0)
MCV: 92.4 fL (ref 80.0–100.0)
Monocytes Absolute: 0.4 10*3/uL (ref 0.1–1.0)
Monocytes Relative: 7 %
Neutro Abs: 2.6 10*3/uL (ref 1.7–7.7)
Neutrophils Relative %: 50 %
Platelets: 261 10*3/uL (ref 150–400)
RBC: 4.37 MIL/uL (ref 4.22–5.81)
RDW: 13.3 % (ref 11.5–15.5)
WBC: 5.4 10*3/uL (ref 4.0–10.5)
nRBC: 0 % (ref 0.0–0.2)

## 2020-03-17 LAB — BRAIN NATRIURETIC PEPTIDE: B Natriuretic Peptide: 41 pg/mL (ref 0.0–100.0)

## 2020-03-17 LAB — TSH: TSH: 2.591 u[IU]/mL (ref 0.350–4.500)

## 2020-03-17 MED ORDER — VALSARTAN 160 MG PO TABS
160.0000 mg | ORAL_TABLET | Freq: Every day | ORAL | 11 refills | Status: DC
Start: 2020-03-17 — End: 2021-03-20

## 2020-03-17 MED ORDER — PANTOPRAZOLE SODIUM 40 MG PO TBEC
40.0000 mg | DELAYED_RELEASE_TABLET | Freq: Every day | ORAL | 2 refills | Status: DC
Start: 2020-03-17 — End: 2020-08-02

## 2020-03-17 MED ORDER — FAMOTIDINE 20 MG PO TABS: ORAL_TABLET | ORAL | 11 refills | Status: DC

## 2020-03-17 NOTE — Progress Notes (Signed)
Craig Robertson, male    DOB: [birth year], 54 y.o.   MRN: [id number]   Brief patient profile:  27 yobm  Never smoker lives in [place] on disability for back/hip with onset of hpb in his mid 42s and placed on cpap around 2016 then 2019 developed chest tightness 24/7 and present ever since with nl echo by PCP per pt and referred to pulmonary clinic in [place]  03/17/2020 by Dr     Lovena Le      History of Present Illness  03/17/2020  Pulmonary/ 1st office eval/Fendi Meinhardt  Chief Complaint  Patient presents with  . Pulmonary Consult    Referred by Dr. Elvia Collum. Pt c/o SOB and chest tightness "for a long time".  Symptoms can occur with or without rest and get worsen when outside in the heat.   Dyspnea: p walking x several blocks breathing and chest tightness worse but always located in midline and made worse by touching the skin over his sternum  Cough: feels congestion in chest but no excess mucus  Sleep: fine on cpap on side/ one pillow  SABA use: not much change  in any of his symptoms p saba   No obvious day to day or daytime variability or assoc excess/ purulent sputum or mucus plugs or hemoptysis or , subjective wheeze or overt sinus or hb symptoms.   Sleeping as above, never sleeps prone,  without nocturnal  or early am exacerbation  of respiratory  c/o's or need for noct saba. Also denies any obvious fluctuation of symptoms with weather or environmental changes or other aggravating or alleviating factors except as outlined above   No unusual exposure hx or h/o childhood pna/ asthma or knowledge of premature birth.  Current Allergies, Complete Past Medical History, Past Surgical History, Family History, and Social History were reviewed in [employer] record.  ROS  The following are not active complaints unless bolded Hoarseness, sore throat, dysphagia, dental problems, itching, sneezing,  nasal congestion or discharge of excess mucus or purulent secretions, ear  ache,   fever, chills, sweats, unintended wt loss or wt gain, classically pleuritic or exertional cp,  orthopnea pnd or arm/hand swelling  or leg swelling, presyncope, palpitations, abdominal pain, anorexia, nausea, vomiting, diarrhea  or change in bowel habits or change in bladder habits, change in stools or change in urine, dysuria, hematuria,  rash, arthralgias, visual complaints, headache, numbness, weakness or ataxia or problems with walking or coordination,  change in mood or  memory.            Past Medical History:  Diagnosis Date  . Arthritis   . Complication of anesthesia    "slow to wake"  . Diabetes mellitus without complication ([place])   . Dyspnea    allegy season   . ED (erectile dysfunction)   . History of hiatal hernia   . Hyperlipidemia   . Hypertension   . Low testosterone   . Sleep apnea    on CPAP   . Stroke [facility]) 2015   TIA     Outpatient Medications Prior to Visit  Medication Sig Dispense Refill  . albuterol (VENTOLIN HFA) 108 (90 Base) MCG/ACT inhaler Inhale 2 puffs into the lungs every 6 (six) hours as needed for wheezing or shortness of breath. 18 g 1  . aspirin 81 MG chewable tablet Chew 1 tablet (81 mg total) by mouth 2 (two) times daily. 60 tablet 0  . atorvastatin (LIPITOR) 40 MG tablet TAKE 1 TABLET  BY MOUTH EVERY DAY 90 tablet 0  . blood glucose meter kit and supplies KIT Dispense based on patient and insurance preference. Use up to test glucose once daily. ICD 10 code E11.9. 1 each 0  . enalapril (VASOTEC) 10 MG tablet Take 1 tablet (10 mg total) by mouth daily. 90 tablet 1  . HYDROcodone-acetaminophen (NORCO/VICODIN) 5-325 MG tablet Take one tablet po every 4-6 hrs prn pain 28 tablet 0  . metFORMIN (GLUCOPHAGE) 500 MG tablet 1 tab p.o. daily with meal. 90 tablet 1  . ONETOUCH VERIO test strip TEST SUGAR DAILY 50 each 5  . Pseudoephedrine-Acetaminophen (SINUS PO) Take 2 tablets by mouth daily as needed (sinus).    . tadalafil (CIALIS) 5 MG tablet TAKE  1 TABLET BY MOUTH DAILY AS NEEDED FOR ERECTILE DYSFUNCTION 30 tablet 5  . verapamil (VERELAN PM) 180 MG 24 hr capsule Take 1 capsule (180 mg total) by mouth 2 (two) times daily. 180 capsule 0  . nabumetone (RELAFEN) 750 MG tablet Take one tablet by mouth twice daily as needed for pain 28 tablet 2      Objective:     BP (!) 144/98 (BP Location: Left Arm, Cuff Size: Normal)   Pulse 65   Temp (!) 97.1 F (36.2 C)   Ht _0  (1.803 m)   Wt 253 lb (114.8 kg)   SpO2 98% Comment: on RA  BMI 35.29 kg/m   SpO2: 98 % (on RA)  amb bm nad    HEENT : pt wearing mask not removed for exam due to covid -19 concerns.    NECK :  without JVD/Nodes/TM/ nl carotid upstrokes bilaterally   LUNGS: no acc muscle use,  Chest wall with evacuated breast tissue so nipples both inverted (since age 80 per pt) , very tender over sternum with clear lungs   CV:  RRR  no s3 or murmur or increase in P2, and no edema   ABD:  soft and nontender with nl inspiratory excursion in the supine position. No bruits or organomegaly appreciated, bowel sounds nl  MS:  Nl gait/ ext warm without deformities, calf tenderness, cyanosis or clubbing No obvious joint restrictions   SKIN: warm and dry without lesions    NEURO:  alert, approp, nl sensorium with  no motor or cerebellar deficits apparent.   CXR PA and Lateral:   03/17/2020 :    I personally reviewed images and agree with radiology impression as follows:   Declined cxr/ labs today, says will return       Assessment   DOE (dyspnea on exertion) Onset 2019 assoc with atypical cp - try off acei 03/17/2020 and rx gerd  Symptoms are markedly disproportionate to objective findings and not clear to what extent this is actually a pulmonary  problem but pt does appear to have difficult to sort out respiratory symptoms of unknown origin for which  DDX  = almost all start with A and  include Adherence, Ace Inhibitors, Acid Reflux, Active Sinus Disease, Alpha 1  Antitripsin deficiency, Anxiety masquerading as Airways dz,  ABPA,  Allergy(esp in young), Aspiration (esp in elderly), Adverse effects of meds,  Active smoking or Vaping, A bunch of PE's/clot burden (a few small clots can't cause this syndrome unless there is already severe underlying pulm or vascular dz with poor reserve),  Anemia or thyroid disorder, plus two Bs  = Bronchiectasis and Beta blocker use..and one C= CHF     Adherence is always the initial "prime suspect"  and is a multilayered concern that requires a "trust but verify" approach in every patient - starting with knowing how to use medications, especially inhalers, correctly, keeping up with refills and understanding the fundamental difference between maintenance and prns vs those medications only taken for a very short course and then stopped and not refilled. - return  with all meds in hand using a trust but verify approach to confirm accurate Medication  Reconciliation The principal here is that until we are certain that the  patients are doing what we've asked, it makes no sense to ask them to do more.   ACEi adverse effects at the  top of the rest of the usual list of suspects and the only way to rule it out is a trial off > see a/p    ? Acid (or non-acid) GERD > always difficult to exclude as up to 75% of pts in some series report no assoc GI/ Heartburn symptoms and he has atypical cp > rec max (24h)  acid suppression and diet restrictions/ reviewed and instructions given in writing.   ? Allergy/ asthma > send IgE  ? Anxiety > usually at the bottom of this list of usual suspects but may interfere with adherence and also interpretation of response or lack thereof to symptom management which can be quite subjective.   ? Anemia / thyroid dz > rec labs  ? A bunch of pe's > very unlikely based on chronicity   ? chf > doesn't explain cp but may related to doe with diastolic dysfunction from hbp > send bnp   Essential hypertension,  benign D/c vasotec 03/17/2020   In the best review of chronic cough to date ( NEJM 2016 375 7096-2836) ,  ACEi are now felt to cause cough in up to  20% of pts which is a 4 fold increase from previous reports and does not include the variety of non-specific complaints we see in pulmonary clinic in pts on ACEi but previously attributed to another dx like  Copd/asthma and  include PNDS, throat and chest congestion, "bronchitis", unexplained dyspnea and noct "strangling" sensations, and hoarseness, but also  atypical /refractory GERD symptoms like  Atypical cp syndromes / dysphagia and "bad heartburn"   The only way I know  to prove this is not an "ACEi Case" is a trial off ACEi x a minimum of 6 weeks then regroup.    Morbid obesity due to excess calories ([place]) Complicated by hbp/ osa/hyperlipidemia/ dm    Christinia Gully, MD 03/17/2020

## 2020-03-17 NOTE — Assessment & Plan Note (Addendum)
Onset 2019 assoc with atypical cp - try off acei 03/17/2020 and rx gerd  Symptoms are markedly disproportionate to objective findings and not clear to what extent this is actually a pulmonary  problem but pt does appear to have difficult to sort out respiratory symptoms of unknown origin for which  DDX  = almost all start with A and  include Adherence, Ace Inhibitors, Acid Reflux, Active Sinus Disease, Alpha 1 Antitripsin deficiency, Anxiety masquerading as Airways dz,  ABPA,  Allergy(esp in young), Aspiration (esp in elderly), Adverse effects of meds,  Active smoking or Vaping, A bunch of PE's/clot burden (a few small clots can't cause this syndrome unless there is already severe underlying pulm or vascular dz with poor reserve),  Anemia or thyroid disorder, plus two Bs  = Bronchiectasis and Beta blocker use..and one C= CHF     Adherence is always the initial "prime suspect" and is a multilayered concern that requires a "trust but verify" approach in every patient - starting with knowing how to use medications, especially inhalers, correctly, keeping up with refills and understanding the fundamental difference between maintenance and prns vs those medications only taken for a very short course and then stopped and not refilled. - return  with all meds in hand using a trust but verify approach to confirm accurate Medication  Reconciliation The principal here is that until we are certain that the  patients are doing what we've asked, it makes no sense to ask them to do more.   ACEi adverse effects at the  top of the rest of the usual list of suspects and the only way to rule it out is a trial off > see a/p    ? Acid (or non-acid) GERD > always difficult to exclude as up to 75% of pts in some series report no assoc GI/ Heartburn symptoms and he has atypical cp > rec max (24h)  acid suppression and diet restrictions/ reviewed and instructions given in writing.   ? Allergy/ asthma > send IgE  ? Anxiety >  usually at the bottom of this list of usual suspects but may interfere with adherence and also interpretation of response or lack thereof to symptom management which can be quite subjective.   ? Anemia / thyroid dz > rec labs  ? A bunch of pe's > very unlikely based on chronicity   ? chf > doesn't explain cp but may related to doe with diastolic dysfunction from hbp > send bnp

## 2020-03-17 NOTE — Assessment & Plan Note (Signed)
D/c vasotec 03/17/2020   In the best review of chronic cough to date ( NEJM 2016 375 7001-7494) ,  ACEi are now felt to cause cough in up to  20% of pts which is a 4 fold increase from previous reports and does not include the variety of non-specific complaints we see in pulmonary clinic in pts on ACEi but previously attributed to another dx like  Copd/asthma and  include PNDS, throat and chest congestion, "bronchitis", unexplained dyspnea and noct "strangling" sensations, and hoarseness, but also  atypical /refractory GERD symptoms like  Atypical cp syndromes / dysphagia and "bad heartburn"   The only way I know  to prove this is not an "ACEi Case" is a trial off ACEi x a minimum of 6 weeks then regroup.

## 2020-03-17 NOTE — Assessment & Plan Note (Signed)
Complicated by hbp/ osa/hyperlipidemia/ dm

## 2020-03-17 NOTE — Patient Instructions (Signed)
Stop enalapril and replace with valsartan 160 mg one daily   Pantoprazole (protonix) 40 mg   Take  30-60 min before first meal of the day and Pepcid (famotidine)  20 mg one after supper  until return to office - this is the best way to tell whether stomach acid is contributing to your problem.    GERD (REFLUX)  is an extremely common cause of respiratory symptoms just like yours , many times with no obvious heartburn at all.    It can be treated with medication, but also with lifestyle changes including elevation of the head of your bed (ideally with 6 -8inch blocks under the headboard of your bed),  Smoking cessation, avoidance of late meals, excessive alcohol, and avoid fatty foods, chocolate, peppermint, colas, red wine, and acidic juices such as orange juice.  NO MINT OR MENTHOL PRODUCTS SO NO COUGH DROPS  USE SUGARLESS CANDY INSTEAD (Jolley ranchers or Stover's or Life Savers) or even ice chips will also do - the key is to swallow to prevent all throat clearing. NO OIL BASED VITAMINS - use powdered substitutes.  Avoid fish oil when coughing.    Please remember to go to the lab and x-ray department at [facility]   for your tests - we will call you with the results when they are available.      Please schedule a follow up office visit in 6 weeks, call sooner if needed

## 2020-03-18 NOTE — Progress Notes (Signed)
Called and spoke with patient about xray results per Dr Wert. All questions answered and patient expressed full understanding. Nothing further needed at this time.

## 2020-03-19 LAB — IGE: IgE (Immunoglobulin E), Serum: 46 IU/mL (ref 6–495)

## 2020-03-21 NOTE — Progress Notes (Signed)
Left detailed msg ok per DPR with results.

## 2020-03-24 ENCOUNTER — Telehealth: Payer: Self-pay | Admitting: Family Medicine

## 2020-03-24 MED ORDER — TADALAFIL 5 MG PO TABS
ORAL_TABLET | ORAL | 1 refills | Status: DC
Start: 2020-03-24 — End: 2020-05-19

## 2020-03-24 NOTE — Addendum Note (Signed)
Addended by: Annalee Genta on: 03/24/2020 06:21 PM   Modules accepted: Orders

## 2020-03-24 NOTE — Telephone Encounter (Signed)
[facility] requesting refill on Tadalafil 5 mg tablet. Take one tablet po daily prn for ED. Pt last seen 02/03/20 for DM. Please advise. Thank you

## 2020-04-12 ENCOUNTER — Telehealth: Payer: Self-pay | Admitting: Internal Medicine

## 2020-04-12 NOTE — Telephone Encounter (Signed)
Called and spoke with pt letting him know that pantoprazole is generic form of Protonix. Pt verbalized understanding. Nothing further needed.

## 2020-04-22 ENCOUNTER — Ambulatory Visit: Payer: Self-pay | Admitting: General Surgery

## 2020-04-22 NOTE — H&P (Signed)
History of Present Illness Craig Filler MD; 04/22/2020 11:03 AM) The patient is a 54 year old male who presents with an inguinal hernia. Referred by: Dr. Ladona Ridgel Chief Complaint: Left inguinal hernia  Patient is a 54 year old male with a history of left inguinal hernia. Patient also has a history of hypertension diabetes. Patient states he noticed a bulge and pain to the left inguinal area approximately 4 weeks ago. He states it become more painful noticed a painful. He states he does not is a bulge. Patient had no signs or symptoms of incarceration or granulation. Patient's a previous left hip replacement as well as back surgery.     Past Surgical History Santiago Glad, CMA; 04/22/2020 10:43 AM) Hip Surgery  Left. Spinal Surgery - Lower Back   Diagnostic Studies History Santiago Glad, [place]; 04/22/2020 10:43 AM) Colonoscopy  1-5 years ago  Allergies Santiago Glad, CMA; 04/22/2020 10:44 AM) Codeine Phosphate *ANALGESICS - OPIOID*  Allergies Reconciled   Medication History Santiago Glad, CMA; 04/22/2020 10:45 AM) Tadalafil (5MG  Tablet, Oral) Active. Famotidine (20MG  Tablet, Oral) Active. Pantoprazole Sodium (40MG  Tablet DR, Oral) Active. Valsartan (160MG  Tablet, Oral) Active. Verapamil HCl ER (180MG  Capsule ER 24HR, Oral) Active. Albuterol Sulfate HFA (108 (90 Base)MCG/ACT Aerosol Soln, Inhalation) Active. Atorvastatin Calcium (40MG  Tablet, Oral) Active. Aspirin (81MG  Tablet, Oral) Active. Medications Reconciled  Social History , ; 04/22/2020 10:43 AM) Alcohol use  Occasional alcohol use. Caffeine use  Coffee, Tea. No drug use  Tobacco use  Never smoker.  Family History , ; 04/22/2020 10:43 AM) Arthritis  Mother. Cancer  Father. Cerebrovascular Accident  Mother. Diabetes Mellitus  Mother. Hypertension  Mother.  Other Problems , CMA; 04/22/2020 10:43 AM) Arthritis  Back Pain   Cerebrovascular Accident  Diabetes Mellitus  High blood pressure  Hypercholesterolemia  Inguinal Hernia  Sleep Apnea     Review of Systems [place] MD; 04/22/2020 11:02 AM) General Not Present- Appetite Loss, Chills, Fatigue, Fever, Night Sweats, Weight Gain and Weight Loss. Skin Not Present- Change in Wart/Mole, Dryness, Hives, Jaundice, New Lesions, Non-Healing Wounds, Rash and Ulcer. HEENT Present- Seasonal Allergies and Wears glasses/contact lenses. Not Present- Earache, Hearing Loss, Hoarseness, Nose Bleed, Oral Ulcers, Ringing in the Ears, Sinus Pain, Sore Throat, Visual Disturbances and Yellow Eyes. Respiratory Not Present- Bloody sputum, Chronic Cough, Difficulty Breathing, Snoring and Wheezing. Breast Not Present- Breast Mass, Breast Pain, Nipple Discharge and Skin Changes. Cardiovascular Not Present- Chest Pain, Difficulty Breathing Lying Down, Leg Cramps, Palpitations, Rapid Heart Rate, Shortness of Breath and Swelling of Extremities. Gastrointestinal Not Present- Abdominal Pain, Bloating, Bloody Stool, Change in Bowel Habits, Chronic diarrhea, Constipation, Difficulty Swallowing, Excessive gas, Gets full quickly at meals, Hemorrhoids, Indigestion, Nausea, Rectal Pain and Vomiting. Male Genitourinary Not Present- Blood in Urine, Change in Urinary Stream, Frequency, Impotence, Nocturia, Painful Urination, Urgency and Urine Leakage. Musculoskeletal Present- Back Pain, Joint Pain, Joint Stiffness, Muscle Pain and Muscle Weakness. Not Present- Swelling of Extremities. Neurological Not Present- Decreased Memory, Fainting, Headaches, Numbness, Seizures, Tingling, Tremor, Trouble walking and Weakness. Psychiatric Not Present- Anxiety, Bipolar, Change in Sleep Pattern, Depression, Fearful and Frequent crying. All other systems negative  Vitals Santiago Glad CMA; 04/22/2020 10:44 AM) 04/22/2020 10:44 AM Weight: 257.4 lb Height: 71in Body Surface Area: 2.35 m Body  Mass Index: 35.9 kg/m  Temp.: 98.2F  Pulse: 74 (Regular)  BP: 150/90(Sitting, Left Arm, Standard)       Physical Exam Santiago Glad MD; 04/22/2020 11:03 AM) The physical exam findings are as follows: Note: 1.  Will proceed to the operating room for a robotic hiatal hernia repair with mesh and Nissen fundoplication  2. Discussed with patient the risks and benefits of the procedure to include but not limited to: Infection, bleeding, damage to structures, possible pneumothorax, possible recurrence. The patient voiced understanding and wishes to proceed.  Abdomen Inspection Hernias - Left - Inguinal hernia - Reducible - Left.    Assessment & Plan Craig Filler MD; 04/22/2020 11:03 AM) LEFT INGUINAL HERNIA (K40.90) Impression: 54 year old male with a left inguinal hernia  1. The patient will like to proceed to the operating room for robotic left inguinal hernia repair with mesh.  2. I discussed with the patient the signs and symptoms of incarceration and strangulation and the need to proceed to the ER should they occur.  3. I discussed with the patient the risks and benefits of the procedure to include but not limited to: Infection, bleeding, damage to surrounding structures, possible need for further surgery, possible nerve pain, and possible recurrence. The patient was understanding and wishes to proceed.

## 2020-05-04 ENCOUNTER — Other Ambulatory Visit: Payer: Self-pay | Admitting: Family Medicine

## 2020-05-11 ENCOUNTER — Ambulatory Visit (INDEPENDENT_AMBULATORY_CARE_PROVIDER_SITE_OTHER): Payer: Managed Care, Other (non HMO) | Admitting: Internal Medicine

## 2020-05-11 ENCOUNTER — Encounter: Payer: Self-pay | Admitting: Internal Medicine

## 2020-05-11 ENCOUNTER — Other Ambulatory Visit: Payer: Self-pay

## 2020-05-11 DIAGNOSIS — I10 Essential (primary) hypertension: Secondary | ICD-10-CM

## 2020-05-11 DIAGNOSIS — R058 Other specified cough: Secondary | ICD-10-CM | POA: Diagnosis not present

## 2020-05-11 DIAGNOSIS — R0600 Dyspnea, unspecified: Secondary | ICD-10-CM

## 2020-05-11 DIAGNOSIS — R0609 Other forms of dyspnea: Secondary | ICD-10-CM

## 2020-05-11 NOTE — Assessment & Plan Note (Signed)
D/c vasotec 03/17/2020 due to cough, uexplained sob/ chest tightness > resolved   Although even in retrospect it may not be clear the ACEi contributed to the pt's symptoms,  Pt improved off them and adding them back at this point or in the future would risk confusion in interpretation of non-specific respiratory symptoms to which this patient is prone  ie  Better not to muddy the waters here.   >> ok on diovan 160 mg daily > f/u PCP

## 2020-05-11 NOTE — Assessment & Plan Note (Signed)
Onset 2019  - try off acei 03/17/2020  > resolved as of 05/11/2020 on gerd rx / still some throat clearing  - Allergy profile 03/17/20 >  Eos 0.2 /  IgE  46   All chest symtoms resolved, some throat clearing persists (see separate a/p)   No further pulmonary eval needed, f/u can be prn

## 2020-05-11 NOTE — Assessment & Plan Note (Signed)
Upper airway cough syndrome (previously labeled PNDS),  is so named because it's frequently impossible to sort out how much is  CR/sinusitis with freq throat clearing (which can be related to primary GERD)   vs  causing  secondary (" extra esophageal")  GERD from wide swings in gastric pressure that occur with throat clearing, often  promoting self use of mint and menthol lozenges that reduce the lower esophageal sphincter tone and exacerbate the problem further in a cyclical fashion.   These are the same pts (now being labeled as having "irritable larynx syndrome" by some cough centers) who not infrequently have a history of having failed to tolerate ace inhibitors,  dry powder inhalers or biphosphonates or report having atypical/extraesophageal reflux symptoms that don't respond to standard doses of PPI  and are easily confused as having aecopd or asthma flares by even experienced allergists/ pulmonologists (myself included).    >>> rec continue gerd rx and off acei.  If throat clearing persists would consider trial of gabapentin 100 tid or refer to Dr Lynelle Doctor at [facility] ent/ voice center but no further pulmonary f/u needed          Each maintenance medication was reviewed in detail including emphasizing most importantly the difference between maintenance and prns and under what circumstances the prns are to be triggered using an action plan format where appropriate.  Total time for H and P, chart review, counseling, teaching device and generating customized AVS unique to this office visit / charting = 20 min

## 2020-05-11 NOTE — Progress Notes (Signed)
Craig Robertson, male    DOB: [birth year], 54 y.o.   MRN: [id number]   Brief patient profile:  75 yobm  Never smoker lives in [place] on disability for back/hip with onset of hpb in his mid 95s and placed on cpap around 2016 then 2019 developed chest tightness 24/7 and present ever since with nl echo by PCP per pt and referred to pulmonary clinic in [place]  03/17/2020 by Dr  Elvia Collum      History of Present Illness  03/17/2020  Pulmonary/ 1st office eval/Mayson Mcneish  Chief Complaint  Patient presents with  . Pulmonary Consult    Referred by Dr. Elvia Collum. Pt c/o SOB and chest tightness "for a long time".  Symptoms can occur with or without rest and get worsen when outside in the heat.   Dyspnea: p walking x several blocks breathing and chest tightness worse but always located in midline and made worse by touching the skin over his sternum  Cough: feels congestion in chest but no excess mucus  Sleep: fine on cpap on side/ one pillow  SABA use: not much change  in any of his symptoms p saba  rec Stop enalapril and replace with valsartan 160 mg one daily  Pantoprazole (protonix) 40 mg   Take  30-60 min before first meal of the day and Pepcid (famotidine)  20 mg one after supper   GERD diet    05/11/2020  f/u ov/[place] office/Gennaro Lizotte re: uacs  acei vs gerd  Chief Complaint  Patient presents with  . Follow-up    Breathing has improved some since the last visit. He c/o cough and congestion- cough prod with white sputum. He rarely uses his albuterol inhaler.    Dyspnea:  No longer limited by breathing but by back/ hip  Cough: much better, still some clearing throat daytime only  Sleeping: no resp problems  SABA use: rarely  02: none   No obvious day to day or daytime variability or assoc excess/ purulent sputum or mucus plugs or hemoptysis or cp or chest tightness, subjective wheeze or overt sinus or hb symptoms.   Sleeping  without nocturnal  or early am exacerbation  of  respiratory  c/o's or need for noct saba. Also denies any obvious fluctuation of symptoms with weather or environmental changes or other aggravating or alleviating factors except as outlined above   No unusual exposure hx or h/o childhood pna/ asthma or knowledge of premature birth.  Current Allergies, Complete Past Medical History, Past Surgical History, Family History, and Social History were reviewed in [employer] record.  ROS  The following are not active complaints unless bolded Hoarseness, sore throat, dysphagia, dental problems, itching, sneezing,  nasal congestion or discharge of excess mucus or purulent secretions, ear ache,   fever, chills, sweats, unintended wt loss or wt gain, classically pleuritic or exertional cp,  orthopnea pnd or arm/hand swelling  or leg swelling, presyncope, palpitations, abdominal pain, anorexia, nausea, vomiting, diarrhea  or change in bowel habits or change in bladder habits, change in stools or change in urine, dysuria, hematuria,  rash, arthralgias, visual complaints, headache, numbness, weakness or ataxia or problems with walking or coordination,  change in mood or  memory.        Current Meds  Medication Sig  . albuterol (VENTOLIN HFA) 108 (90 Base) MCG/ACT inhaler Inhale 2 puffs into the lungs every 6 (six) hours as needed for wheezing or shortness of breath.  Marland Kitchen aspirin 81 MG  chewable tablet Chew 1 tablet (81 mg total) by mouth 2 (two) times daily.  Marland Kitchen atorvastatin (LIPITOR) 40 MG tablet TAKE 1 TABLET BY MOUTH EVERY DAY  . blood glucose meter kit and supplies KIT Dispense based on patient and insurance preference. Use up to test glucose once daily. ICD 10 code E11.9.  Marland Kitchen famotidine (PEPCID) 20 MG tablet One after supper  . metFORMIN (GLUCOPHAGE) 500 MG tablet 1 tab p.o. daily with meal.  . nabumetone (RELAFEN) 750 MG tablet Take one tablet by mouth twice daily as needed for pain  . ONETOUCH VERIO test strip TEST SUGAR DAILY  .  pantoprazole (PROTONIX) 40 MG tablet Take 1 tablet (40 mg total) by mouth daily. Take 30-60 min before first meal of the day  . Pseudoephedrine-Acetaminophen (SINUS PO) Take 2 tablets by mouth daily as needed (sinus).  . tadalafil (CIALIS) 5 MG tablet TAKE 1 TABLET BY MOUTH DAILY AS NEEDED FOR ERECTILE DYSFUNCTION  . valsartan (DIOVAN) 160 MG tablet Take 1 tablet (160 mg total) by mouth daily.  . verapamil (VERELAN PM) 180 MG 24 hr capsule Take 1 capsule (180 mg total) by mouth 2 (two) times daily.       Past Medical History:  Diagnosis Date  . Arthritis   . Complication of anesthesia    "slow to wake"  . Diabetes mellitus without complication ([place])   . Dyspnea    allegy season   . ED (erectile dysfunction)   . History of hiatal hernia   . Hyperlipidemia   . Hypertension   . Low testosterone   . Sleep apnea    on CPAP   . Stroke ([place]) 2015   TIA        Objective:      pleasant amb bm occ throat clearing  Wt Readings from Last 3 Encounters:  05/11/20 258 lb (117 kg)  03/17/20 253 lb (114.8 kg)  02/03/20 251 lb (113.9 kg)     Vital signs reviewed - Note on arrival 05/11/2020  02 sats  96% on RA and BP  142/80    HEENT : pt wearing mask not removed for exam due to covid -19 concerns.    NECK :  without JVD/Nodes/TM/ nl carotid upstrokes bilaterally   LUNGS: no acc muscle use,  Nl contour chest which is clear to A and P bilaterally without cough on insp or exp maneuvers   CV:  RRR  no s3 or murmur or increase in P2, and no edema   ABD:  soft and nontender with nl inspiratory excursion in the supine position. No bruits or organomegaly appreciated, bowel sounds nl  MS:  Nl gait/ ext warm without deformities, calf tenderness, cyanosis or clubbing No obvious joint restrictions   SKIN: warm and dry without lesions    NEURO:  alert, approp, nl sensorium with  no motor or cerebellar deficits apparent.               Assessment

## 2020-05-11 NOTE — Patient Instructions (Signed)
No change in medications   Keep candy handy as long as not mint, menthol or chocolate.    If you are satisfied with your treatment plan,  let your doctor know  In 3 months she can either refill your medications or you can return here when your prescription runs out.     If in any way you are not 100% satisfied,  please tell us.  If 100% better, tell your friends!  Pulmonary follow up is as needed

## 2020-05-18 ENCOUNTER — Other Ambulatory Visit: Payer: Self-pay | Admitting: Family Medicine

## 2020-06-09 NOTE — Pre-Procedure Instructions (Signed)
[id number] [place], [place] - [address] AT [facility] [address] [place] [address] Phone: [phone] Fax: [phone]  [id number] [place], [place] - [address] AT [facility] [address] [place] [address] [place] [address] Phone: [phone] Fax: [phone]      Your procedure is scheduled on Tuesday, November 23rd at 11:15 a.m.Marland Kitchen  Report to [facility] Main Entrance "A" at 9:15 A.M., and check in at the Admitting office.  Call this number if you have problems the morning of surgery:  [phone]  Call [phone] if you have any questions prior to your surgery date Monday-Friday 8am-4pm    Remember:  Do not eat after midnight the night before your surgery  You may drink clear liquids until 8:15 a.m. the morning of your surgery.   Clear liquids allowed are: Water, Non-Citrus Juices (without pulp), Carbonated Beverages, Clear Tea, Black Coffee Only, and Gatorade.   Please complete the 10oz bottle of water that was provided to you by 8:15 a.m. the morning of surgery.  Please, if able, drink it in one setting. DO NOT SIP.     Take these medicines the morning of surgery with A SIP OF WATER  atorvastatin (LIPITOR)  famotidine (PEPCID)  pantoprazole (PROTONIX)  verapamil (VERELAN PM)  Albuterol inhaler-if needed. Please bring with you on the day of surgery.   Follow your surgeon's instructions on when to stop Aspirin.  If no instructions were given by your surgeon then you will need to call the office to get those instructions.    As of today, STOP taking any Aspirin (unless otherwise instructed by your surgeon) Aleve, Naproxen, Ibuprofen, Motrin, Advil, Goody's, BC's, all herbal medications, fish oil, and all vitamins. This includes: nabumetone (RELAFEN).    WHAT DO I DO ABOUT MY DIABETES MEDICATION?    Do not take metFORMIN (GLUCOPHAGE) the day of surgery.    HOW TO MANAGE YOUR DIABETES BEFORE AND AFTER  SURGERY  Why is it important to control my blood sugar before and after surgery?  Improving blood sugar levels before and after surgery helps healing and can limit problems.  A way of improving blood sugar control is eating a healthy diet by: o  Eating less sugar and carbohydrates o  Increasing activity/exercise o  Talking with your doctor about reaching your blood sugar goals  High blood sugars (greater than 180 mg/dL) can raise your risk of infections and slow your recovery, so you will need to focus on controlling your diabetes during the weeks before surgery.  Make sure that the doctor who takes care of your diabetes knows about your planned surgery including the date and location.  How do I manage my blood sugar before surgery?  Check your blood sugar at least 4 times a day, starting 2 days before surgery, to make sure that the level is not too high or low.  Check your blood sugar the morning of your surgery when you wake up and every 2 hours until you get to the Short Stay unit. o If your blood sugar is less than 70 mg/dL, you will need to treat for low blood sugar: - Do not take insulin. - Treat a low blood sugar (less than 70 mg/dL) with  cup of clear juice (cranberry or apple), 4 glucose tablets, OR glucose gel. - Recheck blood sugar in 15 minutes after treatment (to make sure it is greater than 70 mg/dL). If your blood sugar is not greater than  70 mg/dL on recheck, call [phone] for further instructions.  Report your blood sugar to the short stay nurse when you get to Short Stay.   If you are admitted to the hospital after surgery: o Your blood sugar will be checked by the staff and you will probably be given insulin after surgery (instead of oral diabetes medicines) to make sure you have good blood sugar levels. o The goal for blood sugar control after surgery is 80-180 mg/dL.                    Do not wear jewelry.            Do not wear lotions, powders, colognes,  or deodorant.            Men may shave face and neck.            Do not bring valuables to the hospital.            [facility] is not responsible for any belongings or valuables.  Do NOT Smoke (Tobacco/Vaping) or drink Alcohol 24 hours prior to your procedure If you use a CPAP at night, you may bring all equipment for your overnight stay.   Contacts, glasses, dentures or bridgework may not be worn into surgery.      For patients admitted to the hospital, discharge time will be determined by your treatment team.   Patients discharged the day of surgery will not be allowed to drive home, and someone needs to stay with them for 24 hours.    Special instructions:   [place]- Preparing For Surgery  Before surgery, you can play an important role. Because skin is not sterile, your skin needs to be as free of germs as possible. You can reduce the number of germs on your skin by washing with CHG (chlorahexidine gluconate) Soap before surgery.  CHG is an antiseptic cleaner which kills germs and bonds with the skin to continue killing germs even after washing.    Oral Hygiene is also important to reduce your risk of infection.  Remember - BRUSH YOUR TEETH THE MORNING OF SURGERY WITH YOUR REGULAR TOOTHPASTE  Please do not use if you have an allergy to CHG or antibacterial soaps. If your skin becomes reddened/irritated stop using the CHG.  Do not shave (including legs and underarms) for at least 48 hours prior to first CHG shower. It is OK to shave your face.  Please follow these instructions carefully.   1. Shower the NIGHT BEFORE SURGERY and the MORNING OF SURGERY with CHG Soap.   2. If you chose to wash your hair, wash your hair first as usual with your normal shampoo.  3. After you shampoo, rinse your hair and body thoroughly to remove the shampoo.  4. Use CHG as you would any other liquid soap. You can apply CHG directly to the skin and wash gently with a scrungie or a clean washcloth.    5. Apply the CHG Soap to your body ONLY FROM THE NECK DOWN.  Do not use on open wounds or open sores. Avoid contact with your eyes, ears, mouth and genitals (private parts). Wash Face and genitals (private parts)  with your normal soap.   6. Wash thoroughly, paying special attention to the area where your surgery will be performed.  7. Thoroughly rinse your body with warm water from the neck down.  8. DO NOT shower/wash with your normal soap after using and rinsing off  the CHG Soap.  9. Pat yourself dry with a CLEAN TOWEL.  10. Wear CLEAN PAJAMAS to bed the night before surgery  11. Place CLEAN SHEETS on your bed the night of your first shower and DO NOT SLEEP WITH PETS.   Day of Surgery: Wear Clean/Comfortable clothing the morning of surgery Do not apply any deodorants/lotions.   Remember to brush your teeth WITH YOUR REGULAR TOOTHPASTE.   Please read over the following fact sheets that you were given.

## 2020-06-10 ENCOUNTER — Encounter (HOSPITAL_COMMUNITY): Payer: Self-pay

## 2020-06-10 ENCOUNTER — Other Ambulatory Visit: Payer: Self-pay

## 2020-06-10 ENCOUNTER — Other Ambulatory Visit (HOSPITAL_COMMUNITY)
Admission: RE | Admit: 2020-06-10 | Discharge: 2020-06-10 | Disposition: A | Discharge status: Home / Self Care | Payer: Managed Care, Other (non HMO) | Source: Ambulatory Visit | Attending: General Surgery | Admitting: General Surgery

## 2020-06-10 ENCOUNTER — Encounter (HOSPITAL_COMMUNITY)
Admission: RE | Admit: 2020-06-10 | Discharge: 2020-06-10 | Disposition: A | Discharge status: Home / Self Care | Payer: Managed Care, Other (non HMO) | Source: Ambulatory Visit | Attending: General Surgery | Admitting: General Surgery

## 2020-06-10 DIAGNOSIS — Z01812 Encounter for preprocedural laboratory examination: Secondary | ICD-10-CM | POA: Insufficient documentation

## 2020-06-10 DIAGNOSIS — Z20822 Contact with and (suspected) exposure to covid-19: Secondary | ICD-10-CM | POA: Insufficient documentation

## 2020-06-10 HISTORY — DX: Gastro-esophageal reflux disease without esophagitis: K21.9

## 2020-06-10 LAB — CBC
HCT: 41.8 % (ref 39.0–52.0)
Hemoglobin: 13.1 g/dL (ref 13.0–17.0)
MCH: 29.2 pg (ref 26.0–34.0)
MCHC: 31.3 g/dL (ref 30.0–36.0)
MCV: 93.1 fL (ref 80.0–100.0)
Platelets: 295 10*3/uL (ref 150–400)
RBC: 4.49 MIL/uL (ref 4.22–5.81)
RDW: 13.2 % (ref 11.5–15.5)
WBC: 5.6 10*3/uL (ref 4.0–10.5)
nRBC: 0 % (ref 0.0–0.2)

## 2020-06-10 LAB — BASIC METABOLIC PANEL
Anion gap: 9 (ref 5–15)
BUN: 10 mg/dL (ref 6–20)
CO2: 27 mmol/L (ref 22–32)
Calcium: 9.3 mg/dL (ref 8.9–10.3)
Chloride: 104 mmol/L (ref 98–111)
Creatinine, Ser: 0.95 mg/dL (ref 0.61–1.24)
GFR, Estimated: >60 mL/min (ref >60)
Glucose, Bld: 96 mg/dL (ref 70–99)
Potassium: 4.1 mmol/L (ref 3.5–5.1)
Sodium: 140 mmol/L (ref 135–145)

## 2020-06-10 LAB — SARS CORONAVIRUS 2 (TAT 6-24 HRS): SARS Coronavirus 2: NEGATIVE

## 2020-06-10 LAB — HEMOGLOBIN A1C
Hgb A1c MFr Bld: 6.2 % — ABNORMAL HIGH (ref 4.8–5.6)
Mean Plasma Glucose: 131.24 mg/dL

## 2020-06-10 LAB — GLUCOSE, CAPILLARY: Glucose-Capillary: 90 mg/dL (ref 70–99)

## 2020-06-10 NOTE — Progress Notes (Signed)
PCP - Malena taylor Cardiologist - denies  PPM/ICD - denies   Chest x-ray - 03/17/20 EKG - 02/10/20 Stress Test - denies ECHO - denies Cardiac Cath - denies  Sleep Study - 06/26/15 and 07/27/15 CPAP - yes, unknown settings. Pt informed to bring machine, tubing and mask the DOS.   Fasting Blood Sugar - 80-90s Checks Blood Sugar twice a week.  Patient instructed to hold all Aspirin, NSAID's, herbal medications, fish oil and vitamins 7 days prior to surgery.   ERAS Protcol -yes PRE-SURGERY Ensure or G2- water and instructions given for am of surgery.   COVID TEST- 11/19. Completed before PAT appt.    Anesthesia review: no  Patient denies shortness of breath, fever, cough and chest pain at PAT appointment   All instructions explained to the patient, with a verbal understanding of the material. Patient agrees to go over the instructions while at home for a better understanding. Patient also instructed to self quarantine after being tested for COVID-19. The opportunity to ask questions was provided.

## 2020-06-12 ENCOUNTER — Other Ambulatory Visit: Payer: Self-pay | Admitting: Family Medicine

## 2020-06-13 NOTE — Telephone Encounter (Signed)
Pt needs appt to f/u on blood pressure. pls schedule for next 20-30 days. Thx. Dr. Ladona Ridgel

## 2020-06-13 NOTE — H&P (Signed)
History of Present Illness  The patient is a 55 year old male who presents with an inguinal hernia. Referred by: Dr. Ladona Ridgel Chief Complaint: Left inguinal hernia  Patient is a 54 year old male with a history of left inguinal hernia. Patient also has a history of hypertension diabetes. Patient states he noticed a bulge and pain to the left inguinal area approximately 4 weeks ago. He states it become more painful noticed a painful. He states he does not is a bulge. Patient had no signs or symptoms of incarceration or granulation. Patient's a previous left hip replacement as well as back surgery.     Past Surgical History Hip Surgery  Left. Spinal Surgery - Lower Back   Diagnostic Studies History  Colonoscopy  1-5 years ago  Allergies  Codeine Phosphate *ANALGESICS - OPIOID*  Allergies Reconciled   Medication History  Tadalafil (5MG  Tablet, Oral) Active. Famotidine (20MG  Tablet, Oral) Active. Pantoprazole Sodium (40MG  Tablet DR, Oral) Active. Valsartan (160MG  Tablet, Oral) Active. Verapamil HCl ER (180MG  Capsule ER 24HR, Oral) Active. Albuterol Sulfate HFA (108 (90 Base)MCG/ACT Aerosol Soln, Inhalation) Active. Atorvastatin Calcium (40MG  Tablet, Oral) Active. Aspirin (81MG  Tablet, Oral) Active. Medications Reconciled  Social History Alcohol use  Occasional alcohol use. Caffeine use  Coffee, Tea. No drug use  Tobacco use  Never smoker.  Family History  Arthritis  Mother. Cancer  Father. Cerebrovascular Accident  Mother. Diabetes Mellitus  Mother. Hypertension  Mother.  Other Problems  Arthritis  Back Pain  Cerebrovascular Accident  Diabetes Mellitus  High blood pressure  Hypercholesterolemia  Inguinal Hernia  Sleep Apnea     Review of Systems  General Not Present- Appetite Loss, Chills, Fatigue, Fever, Night Sweats, Weight Gain and Weight Loss. Skin Not Present- Change in Wart/Mole, Dryness, Hives, Jaundice,  New Lesions, Non-Healing Wounds, Rash and Ulcer. HEENT Present- Seasonal Allergies and Wears glasses/contact lenses. Not Present- Earache, Hearing Loss, Hoarseness, Nose Bleed, Oral Ulcers, Ringing in the Ears, Sinus Pain, Sore Throat, Visual Disturbances and Yellow Eyes. Respiratory Not Present- Bloody sputum, Chronic Cough, Difficulty Breathing, Snoring and Wheezing. Breast Not Present- Breast Mass, Breast Pain, Nipple Discharge and Skin Changes. Cardiovascular Not Present- Chest Pain, Difficulty Breathing Lying Down, Leg Cramps, Palpitations, Rapid Heart Rate, Shortness of Breath and Swelling of Extremities. Gastrointestinal Not Present- Abdominal Pain, Bloating, Bloody Stool, Change in Bowel Habits, Chronic diarrhea, Constipation, Difficulty Swallowing, Excessive gas, Gets full quickly at meals, Hemorrhoids, Indigestion, Nausea, Rectal Pain and Vomiting. Male Genitourinary Not Present- Blood in Urine, Change in Urinary Stream, Frequency, Impotence, Nocturia, Painful Urination, Urgency and Urine Leakage. Musculoskeletal Present- Back Pain, Joint Pain, Joint Stiffness, Muscle Pain and Muscle Weakness. Not Present- Swelling of Extremities. Neurological Not Present- Decreased Memory, Fainting, Headaches, Numbness, Seizures, Tingling, Tremor, Trouble walking and Weakness. Psychiatric Not Present- Anxiety, Bipolar, Change in Sleep Pattern, Depression, Fearful and Frequent crying. All other systems negative    Physical Exam  The physical exam findings are as follows: Note: 1. Will proceed to the operating room for a robotic hiatal hernia repair with mesh and Nissen fundoplication  2. Discussed with patient the risks and benefits of the procedure to include but not limited to: Infection, bleeding, damage to structures, possible pneumothorax, possible recurrence. The patient voiced understanding and wishes to proceed.  Abdomen Inspection Hernias - Left - Inguinal hernia - Reducible -  Left.    Assessment & Plan  LEFT INGUINAL HERNIA (K40.90) Impression: 54 year old male with a left inguinal hernia  1. The patient will like to proceed to the  operating room for robotic left inguinal hernia repair with mesh.  2. I discussed with the patient the signs and symptoms of incarceration and strangulation and the need to proceed to the ER should they occur.  3. I discussed with the patient the risks and benefits of the procedure to include but not limited to: Infection, bleeding, damage to surrounding structures, possible need for further surgery, possible nerve pain, and possible recurrence. The patient was understanding and wishes to proceed.

## 2020-06-14 ENCOUNTER — Ambulatory Visit (HOSPITAL_COMMUNITY): Payer: Managed Care, Other (non HMO) | Admitting: Anesthesiology

## 2020-06-14 ENCOUNTER — Encounter (HOSPITAL_COMMUNITY): Payer: Self-pay | Admitting: General Surgery

## 2020-06-14 ENCOUNTER — Encounter (HOSPITAL_COMMUNITY)
Admission: RE | Disposition: A | Discharge status: Home / Self Care | Payer: Self-pay | Source: Home / Self Care | Attending: General Surgery

## 2020-06-14 ENCOUNTER — Ambulatory Visit (HOSPITAL_COMMUNITY)
Admission: RE | Admit: 2020-06-14 | Discharge: 2020-06-14 | Disposition: A | Discharge status: Home / Self Care | Payer: Managed Care, Other (non HMO) | Attending: General Surgery | Admitting: General Surgery

## 2020-06-14 ENCOUNTER — Other Ambulatory Visit: Payer: Self-pay

## 2020-06-14 DIAGNOSIS — Z8673 Personal history of transient ischemic attack (TIA), and cerebral infarction without residual deficits: Secondary | ICD-10-CM | POA: Diagnosis not present

## 2020-06-14 DIAGNOSIS — Z96642 Presence of left artificial hip joint: Secondary | ICD-10-CM | POA: Insufficient documentation

## 2020-06-14 DIAGNOSIS — I10 Essential (primary) hypertension: Secondary | ICD-10-CM | POA: Diagnosis not present

## 2020-06-14 DIAGNOSIS — K4090 Unilateral inguinal hernia, without obstruction or gangrene, not specified as recurrent: Secondary | ICD-10-CM | POA: Insufficient documentation

## 2020-06-14 DIAGNOSIS — E119 Type 2 diabetes mellitus without complications: Secondary | ICD-10-CM | POA: Diagnosis not present

## 2020-06-14 DIAGNOSIS — Z7982 Long term (current) use of aspirin: Secondary | ICD-10-CM | POA: Diagnosis not present

## 2020-06-14 DIAGNOSIS — Z79899 Other long term (current) drug therapy: Secondary | ICD-10-CM | POA: Insufficient documentation

## 2020-06-14 HISTORY — PX: INSERTION OF MESH: SHX5868

## 2020-06-14 HISTORY — PX: XI ROBOTIC ASSISTED INGUINAL HERNIA REPAIR WITH MESH: SHX6706

## 2020-06-14 LAB — GLUCOSE, CAPILLARY
Glucose-Capillary: 102 mg/dL — ABNORMAL HIGH (ref 70–99)
Glucose-Capillary: 80 mg/dL (ref 70–99)
Glucose-Capillary: 90 mg/dL (ref 70–99)
Glucose-Capillary: 101 mg/dL — ABNORMAL HIGH (ref 70–99)

## 2020-06-14 SURGERY — REPAIR, HERNIA, INGUINAL, ROBOT-ASSISTED, LAPAROSCOPIC, USING MESH
Anesthesia: General | Site: Inguinal | Laterality: Left

## 2020-06-14 MED ORDER — MIDAZOLAM HCL 2 MG/2ML IJ SOLN
INTRAMUSCULAR | Status: AC
  Filled 2020-06-14: qty 2

## 2020-06-14 MED ORDER — ACETAMINOPHEN 500 MG PO TABS
ORAL_TABLET | ORAL | Status: AC
  Administered 2020-06-14: 1000 mg via ORAL
  Filled 2020-06-14: qty 2

## 2020-06-14 MED ORDER — CHLORHEXIDINE GLUCONATE CLOTH 2 % EX PADS: 6.0000 | MEDICATED_PAD | Freq: Once | CUTANEOUS | Status: DC

## 2020-06-14 MED ORDER — DEXAMETHASONE SODIUM PHOSPHATE 10 MG/ML IJ SOLN
INTRAMUSCULAR | Status: DC | PRN
  Administered 2020-06-14: 8 mg via INTRAVENOUS

## 2020-06-14 MED ORDER — FENTANYL CITRATE (PF) 100 MCG/2ML IJ SOLN: 25.0000 ug | INTRAMUSCULAR | Status: DC | PRN

## 2020-06-14 MED ORDER — ORAL CARE MOUTH RINSE: 15.0000 mL | Freq: Once | OROMUCOSAL | Status: AC

## 2020-06-14 MED ORDER — PROMETHAZINE HCL 25 MG/ML IJ SOLN: 6.2500 mg | INTRAMUSCULAR | Status: DC | PRN

## 2020-06-14 MED ORDER — ROCURONIUM BROMIDE 10 MG/ML (PF) SYRINGE
PREFILLED_SYRINGE | INTRAVENOUS | Status: DC | PRN
  Administered 2020-06-14: 50 mg via INTRAVENOUS

## 2020-06-14 MED ORDER — BUPIVACAINE HCL 0.25 % IJ SOLN
INTRAMUSCULAR | Status: DC | PRN
  Administered 2020-06-14: 8 mL

## 2020-06-14 MED ORDER — PROPOFOL 10 MG/ML IV BOLUS
INTRAVENOUS | Status: DC | PRN
  Administered 2020-06-14: 200 mg via INTRAVENOUS

## 2020-06-14 MED ORDER — BUPIVACAINE HCL (PF) 0.25 % IJ SOLN
INTRAMUSCULAR | Status: AC
  Filled 2020-06-14: qty 30

## 2020-06-14 MED ORDER — LIDOCAINE HCL (CARDIAC) PF 100 MG/5ML IV SOSY
PREFILLED_SYRINGE | INTRAVENOUS | Status: DC | PRN
  Administered 2020-06-14: 100 mg via INTRAVENOUS

## 2020-06-14 MED ORDER — CHLORHEXIDINE GLUCONATE 0.12 % MT SOLN
OROMUCOSAL | Status: AC
  Administered 2020-06-14: 15 mL via OROMUCOSAL
  Filled 2020-06-14: qty 15

## 2020-06-14 MED ORDER — CELECOXIB 200 MG PO CAPS
200.0000 mg | ORAL_CAPSULE | Freq: Once | ORAL | Status: AC
  Administered 2020-06-14: 200 mg via ORAL
  Filled 2020-06-14: qty 1

## 2020-06-14 MED ORDER — CHLORHEXIDINE GLUCONATE 0.12 % MT SOLN: 15.0000 mL | Freq: Once | OROMUCOSAL | Status: AC

## 2020-06-14 MED ORDER — SODIUM CHLORIDE 0.9 % IV SOLN
INTRAVENOUS | Status: DC | PRN
  Administered 2020-06-14: 40 mL

## 2020-06-14 MED ORDER — LIDOCAINE HCL (PF) 2 % IJ SOLN
INTRAMUSCULAR | Status: AC
  Filled 2020-06-14: qty 5

## 2020-06-14 MED ORDER — PROPOFOL 10 MG/ML IV BOLUS
INTRAVENOUS | Status: AC
  Filled 2020-06-14: qty 40

## 2020-06-14 MED ORDER — SUCCINYLCHOLINE CHLORIDE 20 MG/ML IJ SOLN
INTRAMUSCULAR | Status: DC | PRN
  Administered 2020-06-14: 160 mg via INTRAVENOUS

## 2020-06-14 MED ORDER — MIDAZOLAM HCL 5 MG/5ML IJ SOLN
INTRAMUSCULAR | Status: DC | PRN
  Administered 2020-06-14: 2 mg via INTRAVENOUS

## 2020-06-14 MED ORDER — ONDANSETRON HCL 4 MG/2ML IJ SOLN
INTRAMUSCULAR | Status: DC | PRN
  Administered 2020-06-14: 4 mg via INTRAVENOUS

## 2020-06-14 MED ORDER — ENSURE PRE-SURGERY PO LIQD: 296.0000 mL | Freq: Once | ORAL | Status: DC

## 2020-06-14 MED ORDER — 0.9 % SODIUM CHLORIDE (POUR BTL) OPTIME
TOPICAL | Status: DC | PRN
  Administered 2020-06-14: 1000 mL

## 2020-06-14 MED ORDER — SUGAMMADEX SODIUM 200 MG/2ML IV SOLN
INTRAVENOUS | Status: DC | PRN
  Administered 2020-06-14 (×2): 100 mg via INTRAVENOUS

## 2020-06-14 MED ORDER — DEXAMETHASONE SODIUM PHOSPHATE 10 MG/ML IJ SOLN
INTRAMUSCULAR | Status: AC
  Filled 2020-06-14: qty 1

## 2020-06-14 MED ORDER — CEFAZOLIN SODIUM-DEXTROSE 2-4 GM/100ML-% IV SOLN
2.0000 g | INTRAVENOUS | Status: AC
  Administered 2020-06-14: 2 g via INTRAVENOUS

## 2020-06-14 MED ORDER — ONDANSETRON HCL 4 MG/2ML IJ SOLN
INTRAMUSCULAR | Status: AC
  Filled 2020-06-14: qty 2

## 2020-06-14 MED ORDER — TRAMADOL HCL 50 MG PO TABS
50.0000 mg | ORAL_TABLET | Freq: Four times a day (QID) | ORAL | 0 refills | Status: AC | PRN
Start: 2020-06-14 — End: 2021-06-14

## 2020-06-14 MED ORDER — LACTATED RINGERS IV SOLN: INTRAVENOUS | Status: DC

## 2020-06-14 MED ORDER — SUCCINYLCHOLINE CHLORIDE 200 MG/10ML IV SOSY
PREFILLED_SYRINGE | INTRAVENOUS | Status: AC
  Filled 2020-06-14: qty 10

## 2020-06-14 MED ORDER — FENTANYL CITRATE (PF) 100 MCG/2ML IJ SOLN
INTRAMUSCULAR | Status: DC | PRN
  Administered 2020-06-14: 100 ug via INTRAVENOUS
  Administered 2020-06-14: 75 ug via INTRAVENOUS

## 2020-06-14 MED ORDER — CEFAZOLIN SODIUM-DEXTROSE 2-4 GM/100ML-% IV SOLN
INTRAVENOUS | Status: AC
  Filled 2020-06-14: qty 100

## 2020-06-14 MED ORDER — FENTANYL CITRATE (PF) 250 MCG/5ML IJ SOLN
INTRAMUSCULAR | Status: AC
  Filled 2020-06-14: qty 5

## 2020-06-14 MED ORDER — ACETAMINOPHEN 500 MG PO TABS: 1000.0000 mg | ORAL_TABLET | Freq: Once | ORAL | Status: AC

## 2020-06-14 MED ORDER — BUPIVACAINE LIPOSOME 1.3 % IJ SUSP
20.0000 mL | Freq: Once | INTRAMUSCULAR | Status: DC
  Filled 2020-06-14: qty 20

## 2020-06-14 MED ORDER — ROCURONIUM BROMIDE 10 MG/ML (PF) SYRINGE
PREFILLED_SYRINGE | INTRAVENOUS | Status: AC
  Filled 2020-06-14: qty 10

## 2020-06-14 SURGICAL SUPPLY — 60 items
CHLORAPREP W/TINT 26 (MISCELLANEOUS) ×2 IMPLANT
COVER MAYO STAND STRL (DRAPES) ×2 IMPLANT
COVER SURGICAL LIGHT HANDLE (MISCELLANEOUS) ×2 IMPLANT
COVER TIP SHEARS 8 DVNC (MISCELLANEOUS) ×1 IMPLANT
COVER TIP SHEARS 8MM DA VINCI (MISCELLANEOUS) ×1
COVER WAND RF STERILE (DRAPES) IMPLANT
DECANTER SPIKE VIAL GLASS SM (MISCELLANEOUS) ×2 IMPLANT
DEFOGGER SCOPE WARMER CLEARIFY (MISCELLANEOUS) IMPLANT
DERMABOND ADVANCED (GAUZE/BANDAGES/DRESSINGS) ×1
DERMABOND ADVANCED .7 DNX12 (GAUZE/BANDAGES/DRESSINGS) ×1 IMPLANT
DEVICE TROCAR PUNCTURE CLOSURE (ENDOMECHANICALS) IMPLANT
DRAPE ARM DVNC X/XI (DISPOSABLE) ×4 IMPLANT
DRAPE COLUMN DVNC XI (DISPOSABLE) ×1 IMPLANT
DRAPE CV SPLIT W-CLR ANES SCRN (DRAPES) ×2 IMPLANT
DRAPE DA VINCI XI ARM (DISPOSABLE) ×4
DRAPE DA VINCI XI COLUMN (DISPOSABLE) ×1
DRAPE ORTHO SPLIT 77X108 STRL (DRAPES) ×2
DRAPE SURG ORHT 6 SPLT 77X108 (DRAPES) ×1 IMPLANT
ELECT REM PT RETURN 9FT ADLT (ELECTROSURGICAL) ×2
ELECTRODE REM PT RTRN 9FT ADLT (ELECTROSURGICAL) ×1 IMPLANT
GLOVE BIO SURGEON STRL SZ7.5 (GLOVE) ×4 IMPLANT
GOWN STRL REUS W/ TWL LRG LVL3 (GOWN DISPOSABLE) ×2 IMPLANT
GOWN STRL REUS W/ TWL XL LVL3 (GOWN DISPOSABLE) ×2 IMPLANT
GOWN STRL REUS W/TWL 2XL LVL3 (GOWN DISPOSABLE) ×2 IMPLANT
GOWN STRL REUS W/TWL LRG LVL3 (GOWN DISPOSABLE) ×2
GOWN STRL REUS W/TWL XL LVL3 (GOWN DISPOSABLE) ×2
KIT BASIN OR (CUSTOM PROCEDURE TRAY) ×2 IMPLANT
KIT TURNOVER KIT B (KITS) ×2 IMPLANT
MARKER SKIN DUAL TIP RULER LAB (MISCELLANEOUS) ×2 IMPLANT
MESH PROGRIP LAP SELF FIXATING (Mesh General) ×1 IMPLANT
MESH PROGRIP LAP SLF FIX 16X12 (Mesh General) ×1 IMPLANT
NEEDLE HYPO 22GX1.5 SAFETY (NEEDLE) ×2 IMPLANT
NEEDLE INSUFFLATION 14GA 120MM (NEEDLE) ×2 IMPLANT
OBTURATOR OPTICAL STANDARD 8MM (TROCAR)
OBTURATOR OPTICAL STND 8 DVNC (TROCAR)
OBTURATOR OPTICALSTD 8 DVNC (TROCAR) IMPLANT
PAD ARMBOARD 7.5X6 YLW CONV (MISCELLANEOUS) ×4 IMPLANT
PENCIL SMOKE EVACUATOR (MISCELLANEOUS) IMPLANT
SCISSORS LAP 5X35 DISP (ENDOMECHANICALS) IMPLANT
SEAL CANN UNIV 5-8 DVNC XI (MISCELLANEOUS) ×3 IMPLANT
SEAL XI 5MM-8MM UNIVERSAL (MISCELLANEOUS) ×3
SET IRRIG TUBING LAPAROSCOPIC (IRRIGATION / IRRIGATOR) IMPLANT
SET TUBE SMOKE EVAC HIGH FLOW (TUBING) ×2 IMPLANT
STAPLER CANNULA SEAL DVNC XI (STAPLE) IMPLANT
STAPLER CANNULA SEAL XI (STAPLE)
STOPCOCK 4 WAY LG BORE MALE ST (IV SETS) ×2 IMPLANT
SUT DVC VLOC 180 2-0 12IN GS21 (SUTURE)
SUT MNCRL AB 4-0 PS2 18 (SUTURE) ×2 IMPLANT
SUT VIC AB 2-0 SH 27 (SUTURE) ×2
SUT VIC AB 2-0 SH 27X BRD (SUTURE) ×1 IMPLANT
SUT VLOC 180 2-0 6IN GS21 (SUTURE) ×2 IMPLANT
SUT VLOC 180 2-0 9IN GS21 (SUTURE) IMPLANT
SUTURE DVC VL 180 2-0 12INGS21 (SUTURE) IMPLANT
SYR 30ML SLIP (SYRINGE) ×2 IMPLANT
SYR TOOMEY 50ML (SYRINGE) ×2 IMPLANT
TOWEL GREEN STERILE FF (TOWEL DISPOSABLE) ×2 IMPLANT
TRAY FOLEY MTR SLVR 14FR STAT (SET/KITS/TRAYS/PACK) IMPLANT
TRAY FOLEY MTR SLVR 16FR STAT (SET/KITS/TRAYS/PACK) IMPLANT
TRAY LAPAROSCOPIC MC (CUSTOM PROCEDURE TRAY) ×2 IMPLANT
TROCAR XCEL NON-BLD 5MMX100MML (ENDOMECHANICALS) IMPLANT

## 2020-06-14 NOTE — Transfer of Care (Signed)
Immediate Anesthesia Transfer of Care Note  Patient: Craig Robertson  Procedure(s) Performed: XI ROBOTIC ASSISTED LEFT INGUINAL HERNIA REPAIR WITH MESH (Left Inguinal) INSERTION OF MESH (Left Inguinal)  Patient Location: PACU  Anesthesia Type:General  Level of Consciousness: drowsy and patient cooperative  Airway & Oxygen Therapy: Patient Spontanous Breathing and Patient connected to face mask oxygen  Post-op Assessment: Report given to RN and Post -op Vital signs reviewed and stable  Post vital signs: Reviewed and stable  Last Vitals:  Vitals Value Taken Time  BP 145/84 06/14/20 1223  Temp    Pulse 70 06/14/20 1223  Resp 15 06/14/20 1223  SpO2 100 % 06/14/20 1223  Vitals shown include unvalidated device data.  Last Pain:  Vitals:   06/14/20 1010  TempSrc:   PainSc: 0-No pain      Patients Stated Pain Goal: 3 (06/14/20 1010)  Complications: No complications documented.

## 2020-06-14 NOTE — Discharge Instructions (Signed)
General Anesthesia, Adult, Care After This sheet gives you information about how to care for yourself after your procedure. Your health care provider may also give you more specific instructions. If you have problems or questions, contact your health care provider. What can I expect after the procedure? After the procedure, the following side effects are common:  Pain or discomfort at the IV site.  Nausea.  Vomiting.  Sore throat.  Trouble concentrating.  Feeling cold or chills.  Weak or tired.  Sleepiness and fatigue.  Soreness and body aches. These side effects can affect parts of the body that were not involved in surgery. Follow these instructions at home:  For at least 24 hours after the procedure:  Have a responsible adult stay with you. It is important to have someone help care for you until you are awake and alert.  Rest as needed.  Do not: ? Participate in activities in which you could fall or become injured. ? Drive. ? Use heavy machinery. ? Drink alcohol. ? Take sleeping pills or medicines that cause drowsiness. ? Make important decisions or sign legal documents. ? Take care of children on your own. Eating and drinking  Follow any instructions from your health care provider about eating or drinking restrictions.  When you feel hungry, start by eating small amounts of foods that are soft and easy to digest (bland), such as toast. Gradually return to your regular diet.  Drink enough fluid to keep your urine pale yellow.  If you vomit, rehydrate by drinking water, juice, or clear broth. General instructions  If you have sleep apnea, surgery and certain medicines can increase your risk for breathing problems. Follow instructions from your health care provider about wearing your sleep device: ? Anytime you are sleeping, including during daytime naps. ? While taking prescription pain medicines, sleeping medicines, or medicines that make you drowsy.  Return to  your normal activities as told by your health care provider. Ask your health care provider what activities are safe for you.  Take over-the-counter and prescription medicines only as told by your health care provider.  If you smoke, do not smoke without supervision.  Keep all follow-up visits as told by your health care provider. This is important. Contact a health care provider if:  You have nausea or vomiting that does not get better with medicine.  You cannot eat or drink without vomiting.  You have pain that does not get better with medicine.  You are unable to pass urine.  You develop a skin rash.  You have a fever.  You have redness around your IV site that gets worse. Get help right away if:  You have difficulty breathing.  You have chest pain.  You have blood in your urine or stool, or you vomit blood. Summary  After the procedure, it is common to have a sore throat or nausea. It is also common to feel tired.  Have a responsible adult stay with you for the first 24 hours after general anesthesia. It is important to have someone help care for you until you are awake and alert.  When you feel hungry, start by eating small amounts of foods that are soft and easy to digest (bland), such as toast. Gradually return to your regular diet.  Drink enough fluid to keep your urine pale yellow.  Return to your normal activities as told by your health care provider. Ask your health care provider what activities are safe for you. This information is not   intended to replace advice given to you by your health care provider. Make sure you discuss any questions you have with your health care provider. Document Revised: 07/12/2017 Document Reviewed: 02/22/2017 Elsevier Patient Education  2020 [employer] CCS _______[facility]  INGUINAL HERNIA REPAIR: POST OP INSTRUCTIONS  Always review your discharge instruction sheet given to you by the facility where your  surgery was performed. IF YOU HAVE DISABILITY OR FAMILY LEAVE FORMS, YOU MUST BRING THEM TO THE OFFICE FOR PROCESSING.   DO NOT GIVE THEM TO YOUR DOCTOR.  1. A  prescription for pain medication may be given to you upon discharge.  Take your pain medication as prescribed, if needed.  If narcotic pain medicine is not needed, then you may take acetaminophen (Tylenol) or ibuprofen (Advil) as needed. 2. Take your usually prescribed medications unless otherwise directed. If you need a refill on your pain medication, please contact your pharmacy.  They will contact our office to request authorization. Prescriptions will not be filled after 5 pm or on week-ends. 3. You should follow a light diet the first 24 hours after arrival home, such as soup and crackers, etc.  Be sure to include lots of fluids daily.  Resume your normal diet the day after surgery. 4.Most patients will experience some swelling and bruising around the umbilicus or in the groin and scrotum.  Ice packs and reclining will help.  Swelling and bruising can take several days to resolve.  6. It is common to experience some constipation if taking pain medication after surgery.  Increasing fluid intake and taking a stool softener (such as Colace) will usually help or prevent this problem from occurring.  A mild laxative (Milk of Magnesia or Miralax) should be taken according to package directions if there are no bowel movements after 48 hours. 7. Unless discharge instructions indicate otherwise, you may remove your bandages 24-48 hours after surgery, and you may shower at that time.  You may have steri-strips (small skin tapes) in place directly over the incision.  These strips should be left on the skin for 7-10 days.  If your surgeon used skin glue on the incision, you may shower in 24 hours.  The glue will flake off over the next 2-3 weeks.  Any sutures or staples will be removed at the office during your follow-up visit. 8. ACTIVITIES:  You may  resume regular (light) daily activities beginning the next day--such as daily self-care, walking, climbing stairs--gradually increasing activities as tolerated.  You may have sexual intercourse when it is comfortable.  Refrain from any heavy lifting or straining until approved by your doctor.  a.You may drive when you are no longer taking prescription pain medication, you can comfortably wear a seatbelt, and you can safely maneuver your car and apply brakes. b.RETURN TO WORK:   _____________________________________________  9.You should see your doctor in the office for a follow-up appointment approximately 2-3 weeks after your surgery.  Make sure that you call for this appointment within a day or two after you arrive home to insure a convenient appointment time. 10.OTHER INSTRUCTIONS: _________________________    _____________________________________  WHEN TO CALL YOUR DOCTOR: 1. Fever over 101.0 2. Inability to urinate 3. Nausea and/or vomiting 4. Extreme swelling or bruising 5. Continued bleeding from incision. 6. Increased pain, redness, or drainage from the incision  The clinic staff is available to answer your questions during regular business hours.  Please don't hesitate to call and ask to speak to one of   the nurses for clinical concerns.  If you have a medical emergency, go to the nearest emergency room or call 911.  A surgeon from [facility] is always on call at the hospital   [address], [address], [place], [place]  [address] ?  [address] [address], [place], [place]   [address] [phone] ? [phone] ? FAX [phone] Web site: [email or web address]  

## 2020-06-14 NOTE — Anesthesia Procedure Notes (Signed)
Procedure Name: Intubation Date/Time: 06/14/2020 11:31 AM Performed by: Kathryne Hitch, CRNA Pre-anesthesia Checklist: Patient identified, Emergency Drugs available, Suction available and Patient being monitored Patient Re-evaluated:Patient Re-evaluated prior to induction Oxygen Delivery Method: Circle system utilized Preoxygenation: Pre-oxygenation with 100% oxygen Induction Type: IV induction Ventilation: Mask ventilation without difficulty Laryngoscope Size: Mac and 3 Grade View: Grade I Tube type: Oral Tube size: 7.5 mm Number of attempts: 1 Airway Equipment and Method: Stylet and Oral airway Placement Confirmation: ETT inserted through vocal cords under direct vision,  positive ETCO2 and breath sounds checked- equal and bilateral Secured at: 23 cm Tube secured with: Tape Dental Injury: Teeth and Oropharynx as per pre-operative assessment

## 2020-06-14 NOTE — Anesthesia Preprocedure Evaluation (Addendum)
Anesthesia Evaluation  Patient identified by MRN, date of birth, ID band Patient awake    Reviewed: Allergy & Precautions, NPO status , Patient's Chart, lab work & pertinent test results  History of Anesthesia Complications Negative for: history of anesthetic complications  Airway Mallampati: III  TM Distance: >3 FB Neck ROM: Full    Dental no notable dental hx. (+) Dental Advisory Given   Pulmonary sleep apnea and Continuous Positive Airway Pressure Ventilation ,    Pulmonary exam normal        Cardiovascular hypertension, Pt. on medications Normal cardiovascular exam     Neuro/Psych CVA    GI/Hepatic Neg liver ROS, GERD  ,  Endo/Other  diabetes  Renal/GU negative Renal ROS     Musculoskeletal negative musculoskeletal ROS (+)   Abdominal   Peds  Hematology negative hematology ROS (+)   Anesthesia Other Findings   Reproductive/Obstetrics                            Anesthesia Physical Anesthesia Plan  ASA: III  Anesthesia Plan: General   Post-op Pain Management:    Induction: Intravenous  PONV Risk Score and Plan: 4 or greater and Ondansetron, Dexamethasone, Midazolam and Treatment may vary due to age or medical condition  Airway Management Planned: Oral ETT  Additional Equipment:   Intra-op Plan:   Post-operative Plan: Extubation in OR  Informed Consent: I have reviewed the patients History and Physical, chart, labs and discussed the procedure including the risks, benefits and alternatives for the proposed anesthesia with the patient or authorized representative who has indicated his/her understanding and acceptance.     Dental advisory given  Plan Discussed with: Anesthesiologist and CRNA  Anesthesia Plan Comments:        Anesthesia Quick Evaluation

## 2020-06-14 NOTE — Interval H&P Note (Signed)
History and Physical Interval Note:  06/14/2020 10:44 AM  Craig Robertson  has presented today for surgery, with the diagnosis of LEFT INGUINAL HERNIA.  The various methods of treatment have been discussed with the patient and family. After consideration of risks, benefits and other options for treatment, the patient has consented to  Procedure(s): XI ROBOTIC ASSISTED LEFT INGUINAL HERNIA REPAIR WITH MESH (Left) as a surgical intervention.  The patient's history has been reviewed, patient examined, no change in status, stable for surgery.  I have reviewed the patient's chart and labs.  Questions were answered to the patient's satisfaction.     Axel Filler

## 2020-06-14 NOTE — Op Note (Signed)
06/14/2020  12:01 PM  PATIENT:  Craig Robertson  54 y.o. male  PRE-OPERATIVE DIAGNOSIS:  LEFT INGUINAL HERNIA  POST-OPERATIVE DIAGNOSIS:  LARGE, LEFT DIRECT  INGUINAL HERNIA  PROCEDURE:  Procedure(s): XI ROBOTIC ASSISTED LEFT INGUINAL HERNIA REPAIR WITH MESH (Left) INSERTION OF MESH (Left)  SURGEON:  Surgeon(s) and Role:    Axel Filler, MD - Primary  ASSISTANTS: Berenda Morale, RNFA   ANESTHESIA:   local, regional and general  EBL:  minimal   BLOOD ADMINISTERED:none  DRAINS: none   LOCAL MEDICATIONS USED:  BUPIVICAINE  and OTHER exparil  SPECIMEN:  No Specimen  DISPOSITION OF SPECIMEN:  N/A  COUNTS:  YES  TOURNIQUET:  * No tourniquets in log *  DICTATION: .Dragon Dictation Details of the procedure:   The patient was taken back to the operating room. The patient was placed in supine position with bilateral SCDs in place.  A Foley catheter was placed.  The patient was prepped and draped in the usual sterile fashion.  After appropriate anitbiotics were confirmed, a time-out was confirmed and all facts were verified.  At this time a Veress needle technique was used to inspect the abdomen approximately 10 cm from the valgus and the paramedian line. This time a 8 mm robotic trocar was placed into the abdomen. The camera was placed there was no injury to any intra-abdominal organs. A 3mm umbilical port was placed just superior to the umbilicus. An 8 mm port was placed approximately 10 cm lateral to the umbilicus on the left paramedian side.  Robot was positioned over the patient and the ports were docked in the usual fashion.  At this time the left-sided peritoneum was taken down from the medial umbilical ligament laterally. The pre-peritoneal space was entered. Dissection was taken down to Cooper's ligament. At this time it was apparent there was a large direct hernia. The hernia was retracted and dissected away from the transversalis fascia. Transversalis fascia  spontaneously retracted. The hernia contained mainly preperitoneal fat. At this time proceeded clean out the rest Cooper's ligament and the medial to lateral direction. A proceeded laterally to dissect the spermatic cord. The spermatic cord was circumferentially dissected away from the surrounding musculature and tissue. The vas deferens was identified and protected all portions of the case. There was no indirect component. At this time I proceeded to create a pocket laterally for the mesh. Once the pocket was created the peritoneum was stripped back to approximately the base of the cord. A 2-0 vicryl was used to imbricate the hernia sac to Coopers ligament.  At this time the piece of 16x12cm Progrip mesh was in placed into the dissected area. This covered both the direct and indirect spaces appropriately. This also covered  The femoral space.  At this time a 2-0 V- lock stitch was used to close the peritoneum in a standard running fashion.  At this time the robot was undocked.  All ports were removed. The skin was reapproximated and all port sites using 4-0 Monocryl subcuticular fashion.  The patient the procedure well was taken to the recovery     PLAN OF CARE: Discharge to home after PACU  PATIENT DISPOSITION:  PACU - hemodynamically stable.   Delay start of Pharmacological VTE agent (>24hrs) due to surgical blood loss or risk of bleeding: not applicable

## 2020-06-14 NOTE — Anesthesia Postprocedure Evaluation (Signed)
Anesthesia Post Note  Patient: Craig Robertson  Procedure(s) Performed: XI ROBOTIC ASSISTED LEFT INGUINAL HERNIA REPAIR WITH MESH (Left Inguinal) INSERTION OF MESH (Left Inguinal)     Patient location during evaluation: PACU Anesthesia Type: General Level of consciousness: sedated Pain management: pain level controlled Vital Signs Assessment: post-procedure vital signs reviewed and stable Respiratory status: spontaneous breathing and respiratory function stable Cardiovascular status: stable Postop Assessment: no apparent nausea or vomiting Anesthetic complications: no   No complications documented.  Last Vitals:  Vitals:   06/14/20 1255 06/14/20 1300  BP: 129/82   Pulse: (!) 54   Resp: 12   Temp:  36.6 C  SpO2: 95%     Last Pain:  Vitals:   06/14/20 1300  TempSrc:   PainSc: 2                  [name]

## 2020-06-15 ENCOUNTER — Encounter (HOSPITAL_COMMUNITY): Payer: Self-pay | Admitting: General Surgery

## 2020-07-07 ENCOUNTER — Other Ambulatory Visit: Payer: Self-pay | Admitting: Family Medicine

## 2020-07-07 NOTE — Telephone Encounter (Signed)
Please schedule a visit and then route back

## 2020-07-11 NOTE — Telephone Encounter (Signed)
Sent my chart message to schedule appointment.

## 2020-07-20 ENCOUNTER — Other Ambulatory Visit: Payer: Self-pay | Admitting: *Deleted

## 2020-07-20 ENCOUNTER — Telehealth: Payer: Self-pay

## 2020-07-20 MED ORDER — VERAPAMIL HCL ER 180 MG PO CP24
ORAL_CAPSULE | ORAL | 0 refills | Status: DC
Start: 2020-07-20 — End: 2020-08-02

## 2020-07-20 NOTE — Telephone Encounter (Signed)
Patient has an appt on 07-29-20 but is out of Verapamil.  He said the pharmacy was suppose to send something.   [facility] [address] in [place]

## 2020-07-26 ENCOUNTER — Other Ambulatory Visit: Payer: Self-pay | Admitting: Family Medicine

## 2020-07-29 ENCOUNTER — Ambulatory Visit: Payer: Managed Care, Other (non HMO) | Admitting: Family Medicine

## 2020-08-02 ENCOUNTER — Encounter: Payer: Self-pay | Admitting: Family Medicine

## 2020-08-02 ENCOUNTER — Other Ambulatory Visit: Payer: Self-pay

## 2020-08-02 ENCOUNTER — Ambulatory Visit (INDEPENDENT_AMBULATORY_CARE_PROVIDER_SITE_OTHER): Payer: Managed Care, Other (non HMO) | Admitting: Family Medicine

## 2020-08-02 VITALS — BP 128/82 | HR 91 | Temp 97.5°F | Ht 72.0 in | Wt 262.4 lb

## 2020-08-02 DIAGNOSIS — Z9889 Other specified postprocedural states: Secondary | ICD-10-CM

## 2020-08-02 DIAGNOSIS — E785 Hyperlipidemia, unspecified: Secondary | ICD-10-CM

## 2020-08-02 DIAGNOSIS — E119 Type 2 diabetes mellitus without complications: Secondary | ICD-10-CM

## 2020-08-02 DIAGNOSIS — I10 Essential (primary) hypertension: Secondary | ICD-10-CM | POA: Diagnosis not present

## 2020-08-02 DIAGNOSIS — Z8719 Personal history of other diseases of the digestive system: Secondary | ICD-10-CM

## 2020-08-02 MED ORDER — VERAPAMIL HCL ER 180 MG PO CP24
ORAL_CAPSULE | ORAL | 1 refills | Status: DC
Start: 2020-08-02 — End: 2021-01-31

## 2020-08-02 MED ORDER — SHINGRIX 50 MCG/0.5ML IM SUSR: 0.5000 mL | Freq: Once | INTRAMUSCULAR | 1 refills | Status: AC

## 2020-08-02 NOTE — Progress Notes (Signed)
Patient ID: Craig Robertson, male    DOB: 1966-06-25, 55 y.o.   MRN: [id number]   Chief Complaint  Patient presents with  . Diabetes   Subjective:    HPI Pt here for med follow up DM/htn. Sugars have been good. Checking sugars 1-2 times per week. Pt does see eye doctor in [place].  Pt was placed on Protonix by pulmonologist. Pt wanting to know whether he should still be on it. Pt reports feeling better while on Protonix.   Had operation about 06/14/20 on hernia left inguinal area.  Healing well from it. Saw surgeon about 2 wks ago.   Dr. Rosendo Gros, surgeon. Had robotic assisted surgery.  gerd/sob- Seeing Dr. Melvyn Novas, pulmonologist. Pt was started on protonix for 3 months.  Feeling better.  Pt wanting to stop the protonix. Pt still taking pepcid.   Dm2- DM2-BG- 100 in AM. When taking 2 metformin was causing gi upset. Pt only taking 1 metformin per day with meal. Foot issue- none. No sores, cuts, or ulcers.  No burning pain in the feet.  occ still having increase urination and polydipisia.   Medical History Haruo has a past medical history of Arthritis, Complication of anesthesia, Diabetes mellitus without complication ([place]), Dyspnea, ED (erectile dysfunction), GERD (gastroesophageal reflux disease), Hyperlipidemia, Hypertension, Low testosterone, Sleep apnea, and Stroke ([place]) (2015).   Outpatient Encounter Medications as of 08/02/2020  Medication Sig  . albuterol (VENTOLIN HFA) 108 (90 Base) MCG/ACT inhaler Inhale 2 puffs into the lungs every 6 (six) hours as needed for wheezing or shortness of breath.  Marland Kitchen aspirin EC 81 MG tablet Take 81 mg by mouth daily. Swallow whole.  Marland Kitchen atorvastatin (LIPITOR) 40 MG tablet TAKE 1 TABLET BY MOUTH EVERY DAY  . blood glucose meter kit and supplies KIT Dispense based on patient and insurance preference. Use up to test glucose once daily. ICD 10 code E11.9.  Marland Kitchen famotidine (PEPCID) 20 MG tablet One after supper (Patient taking differently:  Take 20 mg by mouth daily with supper.)  . metFORMIN (GLUCOPHAGE) 500 MG tablet 1 tab p.o. daily with meal. (Patient taking differently: Take 500 mg by mouth daily with breakfast.)  . ONETOUCH VERIO test strip TEST SUGAR DAILY  . tadalafil (CIALIS) 5 MG tablet TAKE 1 TABLET BY MOUTH DAILY AS NEEDED FOR ERECTILE DYSFUNCTION (Patient taking differently: Take 5 mg by mouth daily as needed for erectile dysfunction.)  . traMADol (ULTRAM) 50 MG tablet Take 1 tablet (50 mg total) by mouth every 6 (six) hours as needed.  . valsartan (DIOVAN) 160 MG tablet Take 1 tablet (160 mg total) by mouth daily.  Marland Kitchen Zoster Vaccine Adjuvanted [facility]) injection Inject 0.5 mLs into the muscle once for 1 dose.  . [DISCONTINUED] nabumetone (RELAFEN) 750 MG tablet Take one tablet by mouth twice daily as needed for pain (Patient taking differently: Take 750 mg by mouth 2 (two) times daily as needed for moderate pain.)  . [DISCONTINUED] pantoprazole (PROTONIX) 40 MG tablet Take 1 tablet (40 mg total) by mouth daily. Take 30-60 min before first meal of the day  . [DISCONTINUED] verapamil (VERELAN PM) 180 MG 24 hr capsule TAKE 1 CAPSULE BY MOUTH TWICE A DAY  . verapamil (VERELAN PM) 180 MG 24 hr capsule TAKE 1 CAPSULE BY MOUTH TWICE A DAY   No facility-administered encounter medications on file as of 08/02/2020.     Review of Systems  Constitutional: Negative for chills and fever.  HENT: Negative for congestion, rhinorrhea and sore  throat.   Respiratory: Negative for cough, shortness of breath and wheezing.   Cardiovascular: Negative for chest pain and leg swelling.  Gastrointestinal: Negative for abdominal pain, constipation, diarrhea, nausea and vomiting.       +recent hernia surgery  Endocrine: Positive for polydipsia and polyuria.  Genitourinary: Negative for difficulty urinating, dysuria, frequency, hematuria and urgency.  Skin: Negative for rash.  Neurological: Negative for dizziness, weakness and headaches.      Vitals BP 128/82   Pulse 91   Temp (!) 97.5 F (36.4 C)   Ht 6' (1.829 m)   Wt 262 lb 6.4 oz (119 kg)   SpO2 98%   BMI 35.59 kg/m   Objective:   Physical Exam Vitals and nursing note reviewed.  Constitutional:      General: He is not in acute distress.    Appearance: Normal appearance. He is not ill-appearing.  HENT:     Head: Normocephalic.     Nose: Nose normal. No congestion.     Mouth/Throat:     Mouth: Mucous membranes are moist.     Pharynx: No oropharyngeal exudate.  Eyes:     Extraocular Movements: Extraocular movements intact.     Conjunctiva/sclera: Conjunctivae normal.     Pupils: Pupils are equal, round, and reactive to light.  Cardiovascular:     Rate and Rhythm: Normal rate and regular rhythm.     Pulses: Normal pulses.     Heart sounds: Normal heart sounds. No murmur heard.   Pulmonary:     Effort: Pulmonary effort is normal.     Breath sounds: Normal breath sounds. No wheezing, rhonchi or rales.  Musculoskeletal:        General: Normal range of motion.     Right lower leg: No edema.     Left lower leg: No edema.  Skin:    General: Skin is warm and dry.     Findings: No rash.  Neurological:     General: No focal deficit present.     Mental Status: He is alert and oriented to person, place, and time.     Cranial Nerves: No cranial nerve deficit.  Psychiatric:        Mood and Affect: Mood normal.        Behavior: Behavior normal.        Thought Content: Thought content normal.        Judgment: Judgment normal.      Assessment and Plan   1. Type 2 diabetes mellitus without complication, without long-term current use of insulin (HCC) - CBC - CMP14+EGFR - Lipid panel - Hemoglobin A1c - Microalbumin, urine  2. Essential hypertension, benign  3. History of hernia surgery  4. Hyperlipidemia, unspecified hyperlipidemia type   DM2- unknown status, pt needing labs. Pt given labs, and pt to get in this week or next week fasting. Pt  not needing refill on metformin has a lot since only taking 1x per day. Last a1c in 11/221- 6.2.   htn- stable. Controlled. Cont meds. Labs ordered.   gerd- stable  was doing well with protonix, but wanting to stop taking it since pain resolved.  Pt was taking this on recommendations from pulm for sob and they were treating his reflux.  Still taking pepcid prn.  HM- covid 2x and booster and flu vaccine utd.  Pt wanting shingles script.   HLD- needing labs.  Status unknown. Cont medications for now.  Had eye exam about 1 yr ago, per our chart  last visit 2018.  Pt stating he will call for appt.  Hernia surgery- healing well.   F/u 77mo

## 2020-08-04 LAB — CMP14+EGFR
ALT: 33 IU/L (ref 0–44)
AST: 24 IU/L (ref 0–40)
Albumin/Globulin Ratio: 1.6 (ref 1.2–2.2)
Albumin: 4.5 g/dL (ref 3.8–4.9)
Alkaline Phosphatase: 86 IU/L (ref 44–121)
BUN/Creatinine Ratio: 15 (ref 9–20)
BUN: 12 mg/dL (ref 6–24)
Bilirubin Total: 0.4 mg/dL (ref 0.0–1.2)
CO2: 26 mmol/L (ref 20–29)
Calcium: 9.2 mg/dL (ref 8.7–10.2)
Chloride: 103 mmol/L (ref 96–106)
Creatinine, Ser: 0.81 mg/dL (ref 0.76–1.27)
GFR calc Af Amer: 117 mL/min/{1.73_m2} (ref >59)
GFR calc non Af Amer: 101 mL/min/{1.73_m2} (ref >59)
Globulin, Total: 2.8 g/dL (ref 1.5–4.5)
Glucose: 103 mg/dL — ABNORMAL HIGH (ref 65–99)
Potassium: 4.5 mmol/L (ref 3.5–5.2)
Sodium: 140 mmol/L (ref 134–144)
Total Protein: 7.3 g/dL (ref 6.0–8.5)

## 2020-08-04 LAB — CBC
Hematocrit: 40.3 % (ref 37.5–51.0)
Hemoglobin: 13.4 g/dL (ref 13.0–17.7)
MCH: 29 pg (ref 26.6–33.0)
MCHC: 33.3 g/dL (ref 31.5–35.7)
MCV: 87 fL (ref 79–97)
Platelets: 260 10*3/uL (ref 150–450)
RBC: 4.62 x10E6/uL (ref 4.14–5.80)
RDW: 12.7 % (ref 11.6–15.4)
WBC: 6.4 10*3/uL (ref 3.4–10.8)

## 2020-08-04 LAB — LIPID PANEL
Chol/HDL Ratio: 2.6 ratio (ref 0.0–5.0)
Cholesterol, Total: 145 mg/dL (ref 100–199)
HDL: 55 mg/dL (ref >39)
LDL Chol Calc (NIH): 72 mg/dL (ref 0–99)
Triglycerides: 94 mg/dL (ref 0–149)
VLDL Cholesterol Cal: 18 mg/dL (ref 5–40)

## 2020-08-04 LAB — MICROALBUMIN, URINE: Microalbumin, Urine: 6.9 ug/mL

## 2020-08-04 LAB — HEMOGLOBIN A1C
Est. average glucose Bld gHb Est-mCnc: 143 mg/dL
Hgb A1c MFr Bld: 6.6 % — ABNORMAL HIGH (ref 4.8–5.6)

## 2020-08-09 ENCOUNTER — Telehealth: Payer: Self-pay

## 2020-08-09 MED ORDER — PANTOPRAZOLE SODIUM 40 MG PO TBEC: 40.0000 mg | DELAYED_RELEASE_TABLET | Freq: Every day | ORAL | 5 refills | Status: DC

## 2020-08-09 NOTE — Telephone Encounter (Signed)
Prescription sent electronically to pharmacy. Patient notified. 

## 2020-08-09 NOTE — Telephone Encounter (Signed)
Pt talked to Dr Ladona Ridgel and decided to go with Protonix needs filled pharmacy needs to go to is [facility] [address] [place] [address] [phone]  Pt call back [phone]

## 2020-08-09 NOTE — Telephone Encounter (Signed)
May have pantoprazole 40 mg this is protonix 1 daily, may have 30 with 5 refills

## 2020-10-19 ENCOUNTER — Other Ambulatory Visit: Payer: Self-pay | Admitting: Family Medicine

## 2020-10-20 NOTE — Telephone Encounter (Signed)
Lab Results  Component Value Date   HGBA1C 6.6 (H) 08/03/2020    Lab Results  Component Value Date   CREATININE 0.81 08/03/2020     Lab Results  Component Value Date   CHOL 145 08/03/2020   HDL 55 08/03/2020   LDLCALC 72 08/03/2020   TRIG 94 08/03/2020   CHOLHDL 2.6 08/03/2020     BP Readings from Last 3 Encounters:  08/02/20 128/82  06/14/20 129/82  06/10/20 (!) 143/89

## 2020-11-16 ENCOUNTER — Other Ambulatory Visit: Payer: Self-pay | Admitting: Family Medicine

## 2021-01-16 ENCOUNTER — Other Ambulatory Visit: Payer: Self-pay

## 2021-01-16 ENCOUNTER — Encounter: Payer: Self-pay | Admitting: Family Medicine

## 2021-01-16 ENCOUNTER — Ambulatory Visit (INDEPENDENT_AMBULATORY_CARE_PROVIDER_SITE_OTHER): Payer: Medicare PPO | Admitting: Family Medicine

## 2021-01-16 VITALS — HR 106 | Temp 97.9°F | Ht 72.0 in | Wt 270.0 lb

## 2021-01-16 DIAGNOSIS — R059 Cough, unspecified: Secondary | ICD-10-CM | POA: Diagnosis not present

## 2021-01-16 DIAGNOSIS — J069 Acute upper respiratory infection, unspecified: Secondary | ICD-10-CM | POA: Diagnosis not present

## 2021-01-16 MED ORDER — PREDNISONE 20 MG PO TABS: 40.0000 mg | ORAL_TABLET | Freq: Every day | ORAL | 0 refills | Status: DC

## 2021-01-16 MED ORDER — AZITHROMYCIN 250 MG PO TABS: ORAL_TABLET | ORAL | 0 refills | Status: DC

## 2021-01-16 NOTE — Progress Notes (Signed)
Patient ID: Craig Robertson, male    DOB: Dec 25, 1965, 55 y.o.   MRN: [id number]   No chief complaint on file. CC- uri Subjective:    HPI Cough, congestion. Sob for 3 -4 weeks. Tried mucinex. Has not had a covid test since symptoms started. No known exposure.   Medical History Jovonni has a past medical history of Arthritis, Complication of anesthesia, Diabetes mellitus without complication ([place]), Dyspnea, ED (erectile dysfunction), GERD (gastroesophageal reflux disease), Hyperlipidemia, Hypertension, Low testosterone, Sleep apnea, and Stroke ([place]) (2015).   Outpatient Encounter Medications as of 01/16/2021  Medication Sig   albuterol (VENTOLIN HFA) 108 (90 Base) MCG/ACT inhaler Inhale 2 puffs into the lungs every 6 (six) hours as needed for wheezing or shortness of breath.   aspirin EC 81 MG tablet Take 81 mg by mouth daily. Swallow whole.   atorvastatin (LIPITOR) 40 MG tablet TAKE 1 TABLET BY MOUTH EVERY DAY   azithromycin (ZITHROMAX Z-PAK) 250 MG tablet Take 2 tab p.o. day 1, then take 1 tab daily for 4 more days.   blood glucose meter kit and supplies KIT Dispense based on patient and insurance preference. Use up to test glucose once daily. ICD 10 code E11.9.   famotidine (PEPCID) 20 MG tablet One after supper (Patient taking differently: Take 20 mg by mouth daily with supper.)   metFORMIN (GLUCOPHAGE) 500 MG tablet 1 tab p.o. daily with meal. (Patient taking differently: Take 500 mg by mouth daily with breakfast.)   ONETOUCH VERIO test strip TEST SUGAR DAILY   pantoprazole (PROTONIX) 40 MG tablet Take 1 tablet (40 mg total) by mouth daily.   predniSONE (DELTASONE) 20 MG tablet Take 2 tablets (40 mg total) by mouth daily with breakfast.   tadalafil (CIALIS) 5 MG tablet TAKE 1 TABLET BY MOUTH DAILY AS NEEDED FOR ERECTILE DYSFUNCTION   traMADol (ULTRAM) 50 MG tablet Take 1 tablet (50 mg total) by mouth every 6 (six) hours as needed.   valsartan (DIOVAN) 160 MG tablet Take 1 tablet (160  mg total) by mouth daily.   [DISCONTINUED] verapamil (VERELAN PM) 180 MG 24 hr capsule TAKE 1 CAPSULE BY MOUTH TWICE A DAY   No facility-administered encounter medications on file as of 01/16/2021.     Review of Systems  Constitutional:  Negative for chills and fever.  HENT:  Positive for congestion and rhinorrhea. Negative for ear pain, sinus pressure, sinus pain, sneezing and sore throat.   Eyes:  Negative for pain, discharge and itching.  Respiratory:  Positive for cough.   Gastrointestinal:  Negative for diarrhea, nausea and vomiting.  Skin:  Negative for rash.  Neurological:  Negative for headaches.    Vitals Pulse (!) 106   Temp 97.9 F (36.6 C)   Ht 6' (1.829 m)   Wt 270 lb (122.5 kg)   SpO2 96%   BMI 36.62 kg/m   Objective:   Physical Exam Vitals and nursing note reviewed.  Constitutional:      General: He is not in acute distress.    Appearance: Normal appearance. He is not ill-appearing.  HENT:     Head: Normocephalic and atraumatic.     Right Ear: Tympanic membrane, ear canal and external ear normal.     Left Ear: Tympanic membrane, ear canal and external ear normal.     Nose: Nose normal. No congestion or rhinorrhea.     Mouth/Throat:     Mouth: Mucous membranes are moist.     Pharynx: No oropharyngeal exudate or  posterior oropharyngeal erythema.  Eyes:     Extraocular Movements: Extraocular movements intact.     Conjunctiva/sclera: Conjunctivae normal.     Pupils: Pupils are equal, round, and reactive to light.  Cardiovascular:     Rate and Rhythm: Normal rate and regular rhythm.     Pulses: Normal pulses.     Heart sounds: Normal heart sounds. No murmur heard. Pulmonary:     Effort: Pulmonary effort is normal. No respiratory distress.     Breath sounds: No wheezing, rhonchi or rales.  Musculoskeletal:     Cervical back: Normal range of motion.  Skin:    General: Skin is warm and dry.     Findings: No rash.  Neurological:     Mental Status: He is  alert.  Psychiatric:        Behavior: Behavior normal.     Assessment and Plan   1. Viral URI - predniSONE (DELTASONE) 20 MG tablet; Take 2 tablets (40 mg total) by mouth daily with breakfast.  Dispense: 10 tablet; Refill: 0  2. Cough - Novel Coronavirus, NAA ([facility]) - SARS-COV-2, NAA 2 DAY TAT - Specimen status report   -gave watch and wait script for azithromycin if not feeling better to start this.  Reviewed viral vs. Bacterial illness.in meantime use otc meds.  Pt voiced understanding.   Return if symptoms worsen or fail to improve.   02/04/2021

## 2021-01-17 LAB — SPECIMEN STATUS REPORT

## 2021-01-17 LAB — NOVEL CORONAVIRUS, NAA: SARS-CoV-2, NAA: NOT DETECTED

## 2021-01-17 LAB — SARS-COV-2, NAA 2 DAY TAT

## 2021-01-17 NOTE — Progress Notes (Signed)
Left message to return call 

## 2021-01-29 ENCOUNTER — Other Ambulatory Visit: Payer: Self-pay | Admitting: Family Medicine

## 2021-01-31 NOTE — Telephone Encounter (Signed)
Needs appt to f/u on labs and dm/htn in next 30 days. Dr. Ladona Ridgel

## 2021-01-31 NOTE — Telephone Encounter (Signed)
Please contact patient to have him schedule follow up. Thank you 

## 2021-02-01 DIAGNOSIS — G4731 Primary central sleep apnea: Secondary | ICD-10-CM | POA: Diagnosis not present

## 2021-02-01 NOTE — Telephone Encounter (Signed)
Sent mychart message

## 2021-02-08 ENCOUNTER — Other Ambulatory Visit: Payer: Self-pay | Admitting: Family Medicine

## 2021-02-08 ENCOUNTER — Other Ambulatory Visit: Payer: Self-pay

## 2021-02-08 MED ORDER — PANTOPRAZOLE SODIUM 40 MG PO TBEC: 40.0000 mg | DELAYED_RELEASE_TABLET | Freq: Every day | ORAL | 0 refills | Status: DC

## 2021-02-09 ENCOUNTER — Telehealth (INDEPENDENT_AMBULATORY_CARE_PROVIDER_SITE_OTHER): Payer: Medicare PPO | Admitting: Nurse Practitioner

## 2021-02-09 ENCOUNTER — Other Ambulatory Visit: Payer: Self-pay

## 2021-02-09 DIAGNOSIS — R059 Cough, unspecified: Secondary | ICD-10-CM | POA: Diagnosis not present

## 2021-02-09 DIAGNOSIS — K219 Gastro-esophageal reflux disease without esophagitis: Secondary | ICD-10-CM

## 2021-02-09 DIAGNOSIS — R0982 Postnasal drip: Secondary | ICD-10-CM | POA: Diagnosis not present

## 2021-02-09 MED ORDER — PREDNISONE 20 MG PO TABS: ORAL_TABLET | ORAL | 0 refills | Status: DC

## 2021-02-09 MED ORDER — PANTOPRAZOLE SODIUM 40 MG PO TBEC: 40.0000 mg | DELAYED_RELEASE_TABLET | Freq: Two times a day (BID) | ORAL | 0 refills | Status: DC

## 2021-02-09 NOTE — Progress Notes (Addendum)
Subjective:    Patient ID: Craig Robertson, male    DOB: 1966-03-30, 55 y.o.   MRN: [id number] I connected with  LEVERT HESLOP on 02/09/21 by phone and verified that I am speaking with the correct person using two identifiers.   I discussed the limitations of evaluation and management by phone. The patient expressed understanding and agreed to proceed.  Patient location: home  Provider location: in office    HPI  Cough- Follow up , still having dry cough , feeling congested, sob , and has wheezing , using inhaler Q 4 to 6 hrs and not helping  Treated for a viral URI 01/16/2021.  Saw some slight improvement with a few days of prednisone but symptoms came back.  States his sugars are currently doing well and did not see a huge increase while on prednisone.  Itchy throat.  Dry nonproductive cough.  Head congestion.  No sinus headache.  Chest discomfort and tightness, cannot lie on either side of his chest.  No burning in the throat or abdomen.  Slight sore throat.  No ear pain.  Limiting caffeine intake.  Non-smoker.  Occasional NSAID use.  Denies of alcohol use.  Mucinex 12-hour dosing helps symptoms about 2 to 3 hours.  Taking pantoprazole in the morning and Pepcid at night.  Albuterol being used every 4-6 hours which helps a little especially with wheezing.  No unusual shortness of breath. No fever. No change in SOB.         Objective:   Physical Exam Today's visit was via telephone Physical exam was not possible for this visit Alert, oriented.  No obvious shortness of breath with talking.  When asked to press him on the upper mid abdominal/epigastric area, this does produce mild pain.      Assessment & Plan:   Problem List Items Addressed This Visit       Digestive   Gastroesophageal reflux disease without esophagitis   Relevant Medications   pantoprazole (PROTONIX) 40 MG tablet   Other Visit Diagnoses     Cough    -  Primary   Post-nasal drainage          Meds  ordered this encounter  Medications   pantoprazole (PROTONIX) 40 MG tablet    Sig: Take 1 tablet (40 mg total) by mouth 2 (two) times daily before a meal.    Dispense:  60 tablet    Refill:  0    Order Specific Question:   Supervising Provider    Answer:   Lilyan Punt A [[id number]]   predniSONE (DELTASONE) 20 MG tablet    Sig: 3 po qd x 3 d then 2 po qd x 3 d then 1 po qd x 2 d    Dispense:  17 tablet    Refill:  0    Order Specific Question:   Supervising Provider    Answer:   Lilyan Punt A [[id number]]   Another course of prednisone prescribed to see if this will help congestion.  Monitor blood sugars while taking prednisone and contact office if any severe elevation. Changed to pantoprazole 40 mg twice daily for GERD. Warning signs reviewed regarding cough. Call back in 7 to 10 days if no improvement, sooner if worse.  Continue OTC meds as directed for symptomatic care. Discussed diet and lifestyle factors affecting GERD. Return if symptoms worsen or fail to improve.   I provided 15 minutes of non face - to - face time during this  encounter.

## 2021-02-10 ENCOUNTER — Encounter: Payer: Self-pay | Admitting: Nurse Practitioner

## 2021-02-10 DIAGNOSIS — K219 Gastro-esophageal reflux disease without esophagitis: Secondary | ICD-10-CM | POA: Insufficient documentation

## 2021-02-17 ENCOUNTER — Emergency Department (HOSPITAL_COMMUNITY)
Admission: EM | Admit: 2021-02-17 | Discharge: 2021-02-17 | Disposition: A | Discharge status: Home / Self Care | Payer: Medicare PPO | Attending: Emergency Medicine | Admitting: Emergency Medicine

## 2021-02-17 ENCOUNTER — Encounter (HOSPITAL_COMMUNITY): Payer: Self-pay | Admitting: *Deleted

## 2021-02-17 ENCOUNTER — Ambulatory Visit (INDEPENDENT_AMBULATORY_CARE_PROVIDER_SITE_OTHER): Payer: Medicare PPO | Admitting: Family Medicine

## 2021-02-17 ENCOUNTER — Other Ambulatory Visit: Payer: Self-pay

## 2021-02-17 VITALS — BP 142/87 | HR 71 | Temp 97.3°F | Ht 72.0 in | Wt 248.0 lb

## 2021-02-17 DIAGNOSIS — Z7982 Long term (current) use of aspirin: Secondary | ICD-10-CM | POA: Insufficient documentation

## 2021-02-17 DIAGNOSIS — R432 Parageusia: Secondary | ICD-10-CM | POA: Diagnosis not present

## 2021-02-17 DIAGNOSIS — R430 Anosmia: Secondary | ICD-10-CM | POA: Diagnosis not present

## 2021-02-17 DIAGNOSIS — E1165 Type 2 diabetes mellitus with hyperglycemia: Secondary | ICD-10-CM | POA: Diagnosis not present

## 2021-02-17 DIAGNOSIS — Z79899 Other long term (current) drug therapy: Secondary | ICD-10-CM | POA: Insufficient documentation

## 2021-02-17 DIAGNOSIS — R059 Cough, unspecified: Secondary | ICD-10-CM

## 2021-02-17 DIAGNOSIS — Z96642 Presence of left artificial hip joint: Secondary | ICD-10-CM | POA: Insufficient documentation

## 2021-02-17 DIAGNOSIS — Z7984 Long term (current) use of oral hypoglycemic drugs: Secondary | ICD-10-CM | POA: Diagnosis not present

## 2021-02-17 DIAGNOSIS — I10 Essential (primary) hypertension: Secondary | ICD-10-CM | POA: Insufficient documentation

## 2021-02-17 DIAGNOSIS — R519 Headache, unspecified: Secondary | ICD-10-CM | POA: Insufficient documentation

## 2021-02-17 DIAGNOSIS — R824 Acetonuria: Secondary | ICD-10-CM

## 2021-02-17 DIAGNOSIS — R739 Hyperglycemia, unspecified: Secondary | ICD-10-CM | POA: Diagnosis present

## 2021-02-17 LAB — COMPREHENSIVE METABOLIC PANEL
ALT: 27 U/L (ref 0–44)
AST: 15 U/L (ref 15–41)
Albumin: 4.4 g/dL (ref 3.5–5.0)
Alkaline Phosphatase: 77 U/L (ref 38–126)
Anion gap: 10 (ref 5–15)
BUN: 27 mg/dL — ABNORMAL HIGH (ref 6–20)
CO2: 28 mmol/L (ref 22–32)
Calcium: 9.6 mg/dL (ref 8.9–10.3)
Chloride: 100 mmol/L (ref 98–111)
Creatinine, Ser: 1.26 mg/dL — ABNORMAL HIGH (ref 0.61–1.24)
GFR, Estimated: >60 mL/min (ref >60)
Glucose, Bld: 457 mg/dL — ABNORMAL HIGH (ref 70–99)
Potassium: 4 mmol/L (ref 3.5–5.1)
Sodium: 138 mmol/L (ref 135–145)
Total Bilirubin: 1.1 mg/dL (ref 0.3–1.2)
Total Protein: 8.1 g/dL (ref 6.5–8.1)

## 2021-02-17 LAB — URINALYSIS, ROUTINE W REFLEX MICROSCOPIC
Bacteria, UA: NONE SEEN
Bilirubin Urine: NEGATIVE
Glucose, UA: >=500 mg/dL — AB
Hgb urine dipstick: NEGATIVE
Ketones, ur: 5 mg/dL — AB
Leukocytes,Ua: NEGATIVE
Nitrite: NEGATIVE
Protein, ur: NEGATIVE mg/dL
Specific Gravity, Urine: 1.033 — ABNORMAL HIGH (ref 1.005–1.030)
pH: 5 (ref 5.0–8.0)

## 2021-02-17 LAB — POCT URINALYSIS DIPSTICK
Glucose, UA: POSITIVE — AB
Ketones, UA: POSITIVE
Spec Grav, UA: 1.015 (ref 1.010–1.025)
pH, UA: 6 (ref 5.0–8.0)

## 2021-02-17 LAB — CBC WITH DIFFERENTIAL/PLATELET
Abs Immature Granulocytes: 0.03 10*3/uL (ref 0.00–0.07)
Basophils Absolute: 0 10*3/uL (ref 0.0–0.1)
Basophils Relative: 1 %
Eosinophils Absolute: 0.1 10*3/uL (ref 0.0–0.5)
Eosinophils Relative: 1 %
HCT: 41.7 % (ref 39.0–52.0)
Hemoglobin: 13.6 g/dL (ref 13.0–17.0)
Immature Granulocytes: 0 %
Lymphocytes Relative: 29 %
Lymphs Abs: 2.6 10*3/uL (ref 0.7–4.0)
MCH: 29.8 pg (ref 26.0–34.0)
MCHC: 32.6 g/dL (ref 30.0–36.0)
MCV: 91.2 fL (ref 80.0–100.0)
Monocytes Absolute: 0.5 10*3/uL (ref 0.1–1.0)
Monocytes Relative: 6 %
Neutro Abs: 5.6 10*3/uL (ref 1.7–7.7)
Neutrophils Relative %: 63 %
Platelets: 276 10*3/uL (ref 150–400)
RBC: 4.57 MIL/uL (ref 4.22–5.81)
RDW: 13.2 % (ref 11.5–15.5)
WBC: 8.8 10*3/uL (ref 4.0–10.5)
nRBC: 0 % (ref 0.0–0.2)

## 2021-02-17 LAB — CBG MONITORING, ED
Glucose-Capillary: 358 mg/dL — ABNORMAL HIGH (ref 70–99)
Glucose-Capillary: 328 mg/dL — ABNORMAL HIGH (ref 70–99)
Glucose-Capillary: 351 mg/dL — ABNORMAL HIGH (ref 70–99)
Glucose-Capillary: 315 mg/dL — ABNORMAL HIGH (ref 70–99)

## 2021-02-17 LAB — POCT GLUCOSE (DEVICE FOR HOME USE): POC Glucose: HIGH mg/dl (ref 70–99)

## 2021-02-17 MED ORDER — GLIPIZIDE 5 MG PO TABS: 5.0000 mg | ORAL_TABLET | Freq: Two times a day (BID) | ORAL | 1 refills | Status: DC

## 2021-02-17 MED ORDER — DEXTROSE 50 % IV SOLN: 0.0000 mL | INTRAVENOUS | Status: DC | PRN

## 2021-02-17 MED ORDER — INSULIN ASPART 100 UNIT/ML IJ SOLN
10.0000 [IU] | Freq: Once | INTRAMUSCULAR | Status: AC
  Administered 2021-02-17: 10 [IU] via SUBCUTANEOUS
  Filled 2021-02-17: qty 1

## 2021-02-17 MED ORDER — LACTATED RINGERS IV SOLN: INTRAVENOUS | Status: DC

## 2021-02-17 MED ORDER — DEXTROSE IN LACTATED RINGERS 5 % IV SOLN
INTRAVENOUS | Status: DC
Start: 2021-02-17 — End: 2021-02-17

## 2021-02-17 NOTE — ED Provider Notes (Signed)
[facility] EMERGENCY DEPARTMENT Provider Note   CSN: [id number] Arrival date & time: 02/17/21  1137     History Chief Complaint  Patient presents with   Hyperglycemia    Craig Robertson is a 55 y.o. male.  HPI  Patient presents with hyperglycemia.  He has been having associated headaches and blurry vision.  Was recently started on a prednisone Dosepak for a viral URI, suspect this is the cause.  He was seen in his office today by his primary care doctor who sent him to the ED for suspicion of DKA.  Patient is not having any chest pain or shortness of breath.  He has been having a cough and sore throat.  He also reports decrease/lack of taste sensation.  He is COVID vaccinated and boosted.  Past Medical History:  Diagnosis Date   Arthritis    Complication of anesthesia    "slow to wake"   Diabetes mellitus without complication ([place])    Dyspnea    allegy season    ED (erectile dysfunction)    GERD (gastroesophageal reflux disease)    Hyperlipidemia    Hypertension    Low testosterone    Sleep apnea    on CPAP    Stroke ([place]) 2015   TIA     Patient Active Problem List   Diagnosis Date Noted   Gastroesophageal reflux disease without esophagitis 02/10/2021   Upper airway cough syndrome 05/11/2020   DOE (dyspnea on exertion) 03/17/2020   Morbid obesity due to excess calories ([place]) 03/17/2020   Obstructive sleep apnea 09/30/2018   Status post total hip replacement, left 03/05/2017   Type 2 diabetes mellitus ([place]) 01/16/2017   Erectile dysfunction 04/24/2015   Cerebrovascular accident (stroke) ([place]) 12/14/2013   Hyperlipidemia 12/14/2013   Muscle weakness (generalized) 12/08/2013   Decreased range of motion of upper extremity 12/08/2013   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;  Service: General;  Laterality: Left;   LUMBAR [place] [facility]ERY  2015   lumbar surgery for bulging disc at surgery center    tissue reduction     in chest when he was 55 years old   TOTAL HIP ARTHROPLASTY Left 03/05/2017   Procedure: LEFT TOTAL HIP ARTHROPLASTY ANTERIOR APPROACH;  Surgeon: Paralee Cancel, MD;  Location: [facility];  Service: Orthopedics;  Laterality: Left;  70 mins   XI ROBOTIC ASSISTED INGUINAL HERNIA REPAIR WITH MESH Left 06/14/2020   Procedure: XI ROBOTIC ASSISTED LEFT INGUINAL HERNIA REPAIR WITH MESH;  Surgeon: Ralene Ok, MD;  Location: [facility];  Service: General;  Laterality: Left;       Family History  Problem Relation Age of Onset   Diabetes Cousin    Diabetes Mother    Hypertension Father    Leukemia Father    Hypertension Brother    Hypertension Sister    Diabetes Paternal Aunt    Diabetes Paternal Uncle    Hypertension Brother    Hypertension Brother    Hypertension Brother    Hypertension Sister     Social History   Tobacco Use   Smoking status: Never  Smokeless tobacco: Never  Vaping Use   Vaping Use: Never used  Substance Use Topics   Alcohol use: No    Alcohol/week: 0.0 standard drinks   Drug use: No    Home Medications Prior to Admission medications   Medication Sig Start Date End Date Taking? Authorizing Provider  albuterol (VENTOLIN HFA) 108 (90 Base) MCG/ACT inhaler Inhale 2 puffs into the lungs every 6 (six) hours as needed for wheezing or shortness of breath. 02/03/20   Erven Colla, DO  aspirin EC 81 MG tablet Take 81 mg by mouth daily. Swallow whole.    [provider]  atorvastatin (LIPITOR) 40 MG tablet TAKE 1 TABLET BY MOUTH EVERY DAY 10/20/20   Elvia Collum M, DO  blood glucose meter kit and supplies KIT Dispense  based on patient and insurance preference. Use up to test glucose once daily. ICD 10 code E11.9. 12/20/17   Mikey Kirschner, MD  famotidine (PEPCID) 20 MG tablet One after supper Patient taking differently: Take 20 mg by mouth daily with supper. 03/17/20   Tanda Rockers, MD  glipiZIDE (GLUCOTROL) 5 MG tablet Take 1 tablet (5 mg total) by mouth 2 (two) times daily before a meal. 02/17/21   Lovena Le, Malena M, DO  metFORMIN (GLUCOPHAGE) 500 MG tablet 1 tab p.o. daily with meal. Patient taking differently: Take 500 mg by mouth daily with breakfast. 02/03/20   Elvia Collum M, DO  ONETOUCH VERIO test strip TEST SUGAR DAILY 05/09/18   Mikey Kirschner, MD  pantoprazole (PROTONIX) 40 MG tablet Take 1 tablet (40 mg total) by mouth 2 (two) times daily before a meal. 02/09/21   Nilda Simmer, NP  tadalafil (CIALIS) 5 MG tablet TAKE 1 TABLET BY MOUTH DAILY AS NEEDED FOR ERECTILE DYSFUNCTION 11/20/20   Lovena Le, Malena M, DO  traMADol (ULTRAM) 50 MG tablet Take 1 tablet (50 mg total) by mouth every 6 (six) hours as needed. 06/14/20 06/14/21  Ralene Ok, MD  valsartan (DIOVAN) 160 MG tablet Take 1 tablet (160 mg total) by mouth daily. 03/17/20   Tanda Rockers, MD  verapamil (VERELAN PM) 180 MG 24 hr capsule TAKE 1 CAPSULE BY MOUTH TWICE A DAY 01/31/21   Elvia Collum M, DO    Allergies    Codeine  Review of Systems   Review of Systems  Constitutional:  Negative for chills and fever.  HENT:  Positive for sore throat. Negative for ear pain.   Eyes:  Positive for visual disturbance. Negative for pain.  Respiratory:  Positive for cough. Negative for shortness of breath.   Cardiovascular:  Negative for chest pain and palpitations.  Gastrointestinal:  Negative for abdominal pain and vomiting.  Genitourinary:  Negative for dysuria and hematuria.  Musculoskeletal:  Negative for arthralgias and back pain.  Skin:  Negative for color change and rash.  Neurological:  Positive for headaches. Negative for  seizures and syncope.  All other systems reviewed and are negative.  Physical Exam Updated Vital Signs BP (!) 162/108   Pulse 70   Temp 98 F (36.7 C) (Oral)   Resp 20   SpO2 97%   Physical Exam Vitals and nursing note reviewed. Exam conducted with a chaperone present.  Constitutional:      Appearance: Normal appearance.  HENT:     Head: Normocephalic and atraumatic.  Eyes:     General: No scleral icterus.       Right eye: No discharge.        Left  eye: No discharge.     Extraocular Movements: Extraocular movements intact.     Pupils: Pupils are equal, round, and reactive to light.  Cardiovascular:     Rate and Rhythm: Normal rate and regular rhythm.     Pulses: Normal pulses.     Heart sounds: Normal heart sounds. No murmur heard.   No friction rub. No gallop.  Pulmonary:     Effort: Pulmonary effort is normal. No respiratory distress.     Breath sounds: Normal breath sounds.  Abdominal:     General: Abdomen is flat. Bowel sounds are normal. There is no distension.     Palpations: Abdomen is soft.     Tenderness: There is no abdominal tenderness.  Skin:    General: Skin is warm and dry.     Coloration: Skin is not jaundiced.  Neurological:     Mental Status: He is alert. Mental status is at baseline.     Coordination: Coordination normal.   ED Results / Procedures / Treatments   Labs (all labs ordered are listed, but only abnormal results are displayed) Labs Reviewed  COMPREHENSIVE METABOLIC PANEL - Abnormal; Notable for the following components:      Result Value   Glucose, Bld 457 (*)    BUN 27 (*)    Creatinine, Ser 1.26 (*)    All other components within normal limits  CBG MONITORING, ED - Abnormal; Notable for the following components:   Glucose-Capillary 358 (*)    All other components within normal limits  CBC WITH DIFFERENTIAL/PLATELET  URINALYSIS, ROUTINE W REFLEX MICROSCOPIC  CBG MONITORING, ED    EKG None  Radiology No results  found.  Procedures Procedures   Medications Ordered in ED Medications - No data to display  ED Course  I have reviewed the triage vital signs and the nursing notes.  Pertinent labs & imaging results that were available during my care of the patient were reviewed by me and considered in my medical decision making (see chart for details).  Clinical Course as of 02/17/21 1802  Fri Feb 17, 2021  1801 Resp(!): 21 18 [HS]    Clinical Course User Index [HS] Sherrill Raring, [place]   MDM Rules/Calculators/A&P                           Will evaluate for possible DKA.  Patient is obviously hyperglycemic, will give fluids and check potassium level.  Patient does not appear to be in DKA, but work-up is consistent with hyperglycemia.  We will continue to give fluids and recheck CBG.    Patient is stable, vital signs are also stable.  Blood glucose has been downtrending since initial intake.  He is resting comfortably.  Not in a coma, not acting atypically.  I think he is stable to go home with follow-up from his primary care doctor early next week.    Strict return precautions were discussed with the patient who verbalized agreement and understanding.  Final Clinical Impression(s) / ED Diagnoses Final diagnoses:  None    Rx / DC Orders ED Discharge Orders     None        Sherrill Raring, Hershal Coria 02/17/21 1803    Milton Ferguson, MD 02/20/21 [phone]

## 2021-02-17 NOTE — Progress Notes (Signed)
Patient ID: Craig Robertson, male    DOB: February 14, 1966, 55 y.o.   MRN: [id number]   Chief Complaint  Patient presents with   Diabetes    Follow up   Subjective:    HPI Patient seen today for diabetes follow-up, patient stating has not been feeling well and also reporting having decreased taste and smell and a sore throat.  Sugars have been running high- was recently on prednisone -finished yesterday and it has made sugar high- still having issues with sore throat and congestion and cough- seen for illness 02/09/21.  Patient was taking prednisone and for an upper respiratory illness, per the nurse practitioner on 02/09/21.  Also seen by me on 01/16/21 for viral uri and given 1m daily for 5 days. Pt also given azithromycin at that time.  Patient has a history of diabetes.  However patient reports he has not been taking his metformin as directed.  In the past he was taking 500 mg twice daily.  Then stated he was only taking it once daily due to having GI upset.  Patient was directed to check his blood sugars daily and to call if seeing numbers over 200.  However patient was out of town and did not bring his meter.  Yesterday seeing some high bg- unreadable on his machine. Today seeing 347 this am prior to eating.   Was out of town for 1 wk, didn't have his meter. Last tablet prednisone was yesterday. 693mtapered down 2024mrednisone.  Medical History [place] a past medical history of Arthritis, Complication of anesthesia, Diabetes mellitus without complication ([facility], ED (erectile dysfunction), GERD (gastroesophageal reflux disease), Hyperlipidemia, Hypertension, Low testosterone, Sleep apnea, and Stroke ([id number]).   Outpatient Encounter Medications as of 02/17/2021  Medication Sig   glipiZIDE (GLUCOTROL) 5 MG tablet Take 1 tablet (5 mg total) by mouth 2 (two) times daily before a meal.   albuterol (VENTOLIN HFA) 108 (90 Base) MCG/ACT inhaler Inhale 2 puffs into the lungs  every 6 (six) hours as needed for wheezing or shortness of breath.   aspirin EC 81 MG tablet Take 81 mg by mouth daily. Swallow whole.   atorvastatin (LIPITOR) 40 MG tablet TAKE 1 TABLET BY MOUTH EVERY DAY   blood glucose meter kit and supplies KIT Dispense based on patient and insurance preference. Use up to test glucose once daily. ICD 10 code E11.9.   famotidine (PEPCID) 20 MG tablet One after supper (Patient taking differently: Take 20 mg by mouth daily with supper.)   metFORMIN (GLUCOPHAGE) 500 MG tablet 1 tab p.o. daily with meal. (Patient taking differently: Take 500 mg by mouth daily with breakfast.)   ONETOUCH VERIO test strip TEST SUGAR DAILY   pantoprazole (PROTONIX) 40 MG tablet Take 1 tablet (40 mg total) by mouth 2 (two) times daily before a meal.   tadalafil (CIALIS) 5 MG tablet TAKE 1 TABLET BY MOUTH DAILY AS NEEDED FOR ERECTILE DYSFUNCTION   traMADol (ULTRAM) 50 MG tablet Take 1 tablet (50 mg total) by mouth every 6 (six) hours as needed.   valsartan (DIOVAN) 160 MG tablet Take 1 tablet (160 mg total) by mouth daily.   verapamil (VERELAN PM) 180 MG 24 hr capsule TAKE 1 CAPSULE BY MOUTH TWICE A DAY   [DISCONTINUED] predniSONE (DELTASONE) 20 MG tablet 3 po qd x 3 d then 2 po qd x 3 d then 1 po qd x 2 d   No facility-administered encounter medications on file as of 02/17/2021.  Review of Systems  Constitutional:  Negative for chills and fever.  HENT:  Positive for sore throat. Negative for congestion and rhinorrhea.        + Loss of taste and smell.  Respiratory:  Negative for cough, shortness of breath and wheezing.   Cardiovascular:  Negative for chest pain and leg swelling.  Gastrointestinal:  Negative for abdominal pain, diarrhea, nausea and vomiting.  Endocrine: Positive for polydipsia and polyuria. Negative for polyphagia.  Genitourinary:  Negative for dysuria and frequency.  Skin:  Negative for rash.  Neurological:  Negative for dizziness, weakness and headaches.     Vitals BP (!) 142/87   Pulse 71   Temp (!) 97.3 F (36.3 C) (Oral)   Ht 6' (1.829 m)   Wt 248 lb (112.5 kg)   SpO2 99%   BMI 33.63 kg/m   Objective:   Physical Exam Vitals and nursing note reviewed.  Constitutional:      General: He is not in acute distress.    Appearance: Normal appearance. He is not ill-appearing.  HENT:     Nose: Nose normal. No congestion or rhinorrhea.     Mouth/Throat:     Mouth: Mucous membranes are moist.     Pharynx: No oropharyngeal exudate or posterior oropharyngeal erythema.  Cardiovascular:     Rate and Rhythm: Normal rate and regular rhythm.     Pulses: Normal pulses.     Heart sounds: Normal heart sounds.  Pulmonary:     Effort: Pulmonary effort is normal. No respiratory distress.     Breath sounds: Normal breath sounds.  Musculoskeletal:        General: Normal range of motion.  Skin:    General: Skin is warm and dry.     Findings: No rash.  Neurological:     General: No focal deficit present.     Mental Status: He is alert and oriented to person, place, and time.     Cranial Nerves: No cranial nerve deficit.  Psychiatric:        Mood and Affect: Mood normal.        Behavior: Behavior normal.        Thought Content: Thought content normal.        Judgment: Judgment normal.     Assessment and Plan   1. Type 2 diabetes mellitus with hyperglycemia, without long-term current use of insulin ([facility]CC) - POCT urinalysis dipstick - POCT Glucose (Device for Home Use)  2. Ketonuria  3. Cough - Novel Coronavirus, NAA ([facility])  4. Loss of taste - Novel Coronavirus, NAA ([facility])  5. Loss of smell - Novel Coronavirus, NAA ([facility])   Checked blood sugar while in the office-showing undetectable so likely over 500. Urine testing- showing +ketones. Pt sent to ER- likely DKA. Pt in agreement pt feeling he can walk over the ER and declining EMS. We will follow-up with patient once he is discharged from the hospital.  I advised  patient that I did send glipizide 5 mg twice daily to take when he is discharged from the hospital, unless he is given insulin.  Covid test -pending.  Return if symptoms worsen or fail to improve.

## 2021-02-17 NOTE — ED Triage Notes (Signed)
Recently given a dose pack of prednisone and blood sugar is elevated

## 2021-02-17 NOTE — Discharge Instructions (Addendum)
Continue taking your medications as prescribed.  Please make an appointment to follow-up with your primary care doctor on Monday.  If you still feel lethargic on Sunday, future to feel worse, if you start having nausea and vomiting, if you develop a fever, or if you feel different or weak please return back to the ED for further evaluation.

## 2021-02-17 NOTE — ED Provider Notes (Signed)
Emergency Medicine Provider Triage Evaluation Note  Craig Robertson , a 55 y.o. male  was evaluated in triage.  Pt complains of hyperglycemia.  Was recently given a prednisone Dosepak for a pulmonary issue, states his blood sugar was elevated today.  He has been having headaches and blurry vision..  Review of Systems  Positive: Headaches, blurry vision, high blood sugar Negative: Chest pain, shortness of breath  Physical Exam  BP (!) 162/108   Pulse 70   Temp 98 F (36.7 C) (Oral)   Resp 20   SpO2 97%  Gen:   Awake, no distress   Resp:  Normal effort  MSK:   Moves extremities without difficulty  Other:    Medical Decision Making  Medically screening exam initiated at 12:14 PM.  Appropriate orders placed.  BURHAN BARHAM was informed that the remainder of the evaluation will be completed by another provider, this initial triage assessment does not replace that evaluation, and the importance of remaining in the ED until their evaluation is complete.     Theron Arista, PA-C 02/17/21 1215    Bethann Berkshire, MD 02/20/21 [phone]

## 2021-02-18 LAB — SARS-COV-2, NAA 2 DAY TAT

## 2021-02-18 LAB — SPECIMEN STATUS REPORT

## 2021-02-18 LAB — NOVEL CORONAVIRUS, NAA: SARS-CoV-2, NAA: NOT DETECTED

## 2021-02-19 ENCOUNTER — Emergency Department (HOSPITAL_COMMUNITY)
Admission: EM | Admit: 2021-02-19 | Discharge: 2021-02-19 | Disposition: A | Discharge status: Home / Self Care | Payer: Medicare PPO | Attending: Emergency Medicine | Admitting: Emergency Medicine

## 2021-02-19 ENCOUNTER — Other Ambulatory Visit: Payer: Self-pay

## 2021-02-19 ENCOUNTER — Emergency Department (HOSPITAL_COMMUNITY): Payer: Medicare PPO

## 2021-02-19 ENCOUNTER — Encounter (HOSPITAL_COMMUNITY): Payer: Self-pay | Admitting: Emergency Medicine

## 2021-02-19 DIAGNOSIS — Z79899 Other long term (current) drug therapy: Secondary | ICD-10-CM | POA: Insufficient documentation

## 2021-02-19 DIAGNOSIS — Z96642 Presence of left artificial hip joint: Secondary | ICD-10-CM | POA: Diagnosis not present

## 2021-02-19 DIAGNOSIS — R739 Hyperglycemia, unspecified: Secondary | ICD-10-CM | POA: Diagnosis not present

## 2021-02-19 DIAGNOSIS — Z7984 Long term (current) use of oral hypoglycemic drugs: Secondary | ICD-10-CM | POA: Diagnosis not present

## 2021-02-19 DIAGNOSIS — H547 Unspecified visual loss: Secondary | ICD-10-CM | POA: Diagnosis not present

## 2021-02-19 DIAGNOSIS — R42 Dizziness and giddiness: Secondary | ICD-10-CM | POA: Diagnosis not present

## 2021-02-19 DIAGNOSIS — Z7982 Long term (current) use of aspirin: Secondary | ICD-10-CM | POA: Insufficient documentation

## 2021-02-19 DIAGNOSIS — I10 Essential (primary) hypertension: Secondary | ICD-10-CM | POA: Insufficient documentation

## 2021-02-19 DIAGNOSIS — E1165 Type 2 diabetes mellitus with hyperglycemia: Secondary | ICD-10-CM | POA: Diagnosis not present

## 2021-02-19 LAB — CBG MONITORING, ED
Glucose-Capillary: 338 mg/dL — ABNORMAL HIGH (ref 70–99)
Glucose-Capillary: 213 mg/dL — ABNORMAL HIGH (ref 70–99)

## 2021-02-19 LAB — COMPREHENSIVE METABOLIC PANEL
ALT: 25 U/L (ref 0–44)
AST: 20 U/L (ref 15–41)
Albumin: 4.3 g/dL (ref 3.5–5.0)
Alkaline Phosphatase: 68 U/L (ref 38–126)
Anion gap: 10 (ref 5–15)
BUN: 30 mg/dL — ABNORMAL HIGH (ref 6–20)
CO2: 26 mmol/L (ref 22–32)
Calcium: 9.7 mg/dL (ref 8.9–10.3)
Chloride: 98 mmol/L (ref 98–111)
Creatinine, Ser: 1.21 mg/dL (ref 0.61–1.24)
GFR, Estimated: >60 mL/min (ref >60)
Glucose, Bld: 290 mg/dL — ABNORMAL HIGH (ref 70–99)
Potassium: 3.8 mmol/L (ref 3.5–5.1)
Sodium: 134 mmol/L — ABNORMAL LOW (ref 135–145)
Total Bilirubin: 0.7 mg/dL (ref 0.3–1.2)
Total Protein: 7.9 g/dL (ref 6.5–8.1)

## 2021-02-19 LAB — BLOOD GAS, VENOUS
Acid-Base Excess: 3.6 mmol/L — ABNORMAL HIGH (ref 0.0–2.0)
Bicarbonate: 25 mmol/L (ref 20.0–28.0)
FIO2: 21
O2 Saturation: 34 %
Patient temperature: 37.2
pCO2, Ven: 56.2 mmHg (ref 44.0–60.0)
pH, Ven: 7.333 (ref 7.250–7.430)
pO2, Ven: <32 mmHg — CL (ref 32.0–45.0)

## 2021-02-19 LAB — CBC
HCT: 42.4 % (ref 39.0–52.0)
Hemoglobin: 13.8 g/dL (ref 13.0–17.0)
MCH: 29.2 pg (ref 26.0–34.0)
MCHC: 32.5 g/dL (ref 30.0–36.0)
MCV: 89.8 fL (ref 80.0–100.0)
Platelets: 268 10*3/uL (ref 150–400)
RBC: 4.72 MIL/uL (ref 4.22–5.81)
RDW: 12.8 % (ref 11.5–15.5)
WBC: 8 10*3/uL (ref 4.0–10.5)
nRBC: 0 % (ref 0.0–0.2)

## 2021-02-19 MED ORDER — SODIUM CHLORIDE 0.9 % IV BOLUS
1000.0000 mL | Freq: Once | INTRAVENOUS | Status: AC
  Administered 2021-02-19: 1000 mL via INTRAVENOUS

## 2021-02-19 NOTE — ED Provider Notes (Signed)
[facility] EMERGENCY DEPARTMENT Provider Note   CSN: [id number] Arrival date & time: 02/19/21  1548     History Chief Complaint  Patient presents with   Hyperglycemia    Craig Robertson is a 55 y.o. male.  HPI   Pt is a 55 y/o male with a h/o arthritis, dm, dyspnea, ED, GERD, HLD, HTN, low testosterone, sleep apnea, CVA, who presents to the ED today for eval for eval of elevated blood sugars.  States that he was recently on a course of steroids and ever since then he has felt unwell.  Last week he was having some polydipsia and polyuria.  He further reports that he has been having some blurred vision bilaterally.  He feels somewhat lightheaded and just does not feel like himself.  He has been off of the prednisone for several days now.  Denies any unilateral numbness, weakness, or speech problems. Denies chest pain, sob.   Past Medical History:  Diagnosis Date   Arthritis    Complication of anesthesia    "slow to wake"   Diabetes mellitus without complication ([place])    Dyspnea    allegy season    ED (erectile dysfunction)    GERD (gastroesophageal reflux disease)    Hyperlipidemia    Hypertension    Low testosterone    Sleep apnea    on CPAP    Stroke ([place]) 2015   TIA     Patient Active Problem List   Diagnosis Date Noted   Gastroesophageal reflux disease without esophagitis 02/10/2021   Upper airway cough syndrome 05/11/2020   DOE (dyspnea on exertion) 03/17/2020   Morbid obesity due to excess calories ([place]) 03/17/2020   Obstructive sleep apnea 09/30/2018   Status post total hip replacement, left 03/05/2017   Type 2 diabetes mellitus ([place]) 01/16/2017   Erectile dysfunction 04/24/2015   Cerebrovascular accident (stroke) ([place]) 12/14/2013   Hyperlipidemia 12/14/2013   Muscle weakness (generalized) 12/08/2013   Decreased range of motion of upper extremity 12/08/2013   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 12/08/2013   Hemiparesis, right ([place]) 11/23/2013   Cervical  nerve root disorder 11/22/2013   Lumbar radiculopathy 11/22/2013   Essential hypertension, benign 12/01/2012   Hypogonadism male 12/01/2012    Past Surgical History:  Procedure Laterality Date   BACK SURGERY     INSERTION OF MESH Left 06/14/2020   Procedure: INSERTION OF MESH;  Surgeon: Ralene Ok, MD;  Location: [facility];  Service: General;  Laterality: Left;   LUMBAR [place] [facility]  2015   lumbar surgery for bulging disc at surgery center    tissue reduction     in chest when he was 55 years old   TOTAL HIP ARTHROPLASTY Left 03/05/2017   Procedure: LEFT TOTAL HIP ARTHROPLASTY ANTERIOR APPROACH;  Surgeon: Paralee Cancel, MD;  Location: [facility];  Service: Orthopedics;  Laterality: Left;  70 mins   XI ROBOTIC ASSISTED INGUINAL HERNIA REPAIR WITH MESH Left 06/14/2020   Procedure: XI ROBOTIC ASSISTED LEFT INGUINAL HERNIA REPAIR WITH MESH;  Surgeon: Ralene Ok, MD;  Location: [facility];  Service: General;  Laterality: Left;       Family History  Problem Relation Age of Onset   Diabetes Cousin    Diabetes Mother    Hypertension Father    Leukemia Father    Hypertension Brother    Hypertension Sister    Diabetes Paternal Aunt    Diabetes Paternal Uncle    Hypertension Brother    Hypertension Brother  Hypertension Brother    Hypertension Sister     Social History   Tobacco Use   Smoking status: Never   Smokeless tobacco: Never  Vaping Use   Vaping Use: Never used  Substance Use Topics   Alcohol use: No    Alcohol/week: 0.0 standard drinks   Drug use: No    Home Medications Prior to Admission medications   Medication Sig Start Date End Date Taking? Authorizing Provider  albuterol (VENTOLIN HFA) 108 (90 Base) MCG/ACT inhaler Inhale 2 puffs into the lungs every 6 (six) hours as needed for wheezing or shortness of breath. 02/03/20   Elvia Collum M, DO  Ascorbic Acid (VITAMIN C) 1000 MG tablet Take 1,000 mg by mouth daily.    [provider]  aspirin EC 81 MG  tablet Take 81 mg by mouth daily. Swallow whole.    [provider]  atorvastatin (LIPITOR) 40 MG tablet TAKE 1 TABLET BY MOUTH EVERY DAY 10/20/20   Elvia Collum M, DO  blood glucose meter kit and supplies KIT Dispense based on patient and insurance preference. Use up to test glucose once daily. ICD 10 code E11.9. 12/20/17   Mikey Kirschner, MD  famotidine (PEPCID) 20 MG tablet One after supper Patient taking differently: Take 20 mg by mouth daily with supper. 03/17/20   Tanda Rockers, MD  glipiZIDE (GLUCOTROL) 5 MG tablet Take 1 tablet (5 mg total) by mouth 2 (two) times daily before a meal. 02/17/21   Lovena Le, Malena M, DO  metFORMIN (GLUCOPHAGE) 500 MG tablet 1 tab p.o. daily with meal. Patient taking differently: Take 500 mg by mouth daily with breakfast. 02/03/20   Elvia Collum M, DO  Multiple Vitamin (MULTIVITAMIN) tablet Take 1 tablet by mouth daily.    [provider]  [facility] VERIO test strip TEST SUGAR DAILY 05/09/18   Mikey Kirschner, MD  pantoprazole (PROTONIX) 40 MG tablet Take 1 tablet (40 mg total) by mouth 2 (two) times daily before a meal. 02/09/21   Nilda Simmer, NP  tadalafil (CIALIS) 5 MG tablet TAKE 1 TABLET BY MOUTH DAILY AS NEEDED FOR ERECTILE DYSFUNCTION Patient taking differently: Take 5 mg by mouth daily as needed. TAKE 1 TABLET BY MOUTH DAILY AS NEEDED FOR ERECTILE DYSFUNCTION 11/20/20   Elvia Collum M, DO  traMADol (ULTRAM) 50 MG tablet Take 1 tablet (50 mg total) by mouth every 6 (six) hours as needed. 06/14/20 06/14/21  Ralene Ok, MD  valsartan (DIOVAN) 160 MG tablet Take 1 tablet (160 mg total) by mouth daily. 03/17/20   Tanda Rockers, MD  verapamil (VERELAN PM) 180 MG 24 hr capsule TAKE 1 CAPSULE BY MOUTH TWICE A DAY 01/31/21   Elvia Collum M, DO    Allergies    Codeine  Review of Systems   Review of Systems  Constitutional:  Negative for fever.  HENT:  Positive for congestion and sinus pressure. Negative for ear pain and  sore throat.   Eyes:  Positive for visual disturbance.  Respiratory:  Negative for cough and shortness of breath.   Cardiovascular:  Negative for chest pain.  Gastrointestinal:  Negative for abdominal pain, constipation, diarrhea, nausea and vomiting.  Genitourinary:  Negative for dysuria and hematuria.  Musculoskeletal:  Negative for back pain.  Skin:  Negative for rash.  Neurological:  Positive for dizziness and light-headedness. Negative for headaches.  All other systems reviewed and are negative.  Physical Exam Updated Vital Signs BP (!) 165/111 (BP Location: Left Arm)  Pulse 70   Temp 97.8 F (36.6 C) (Oral)   Resp 18   Ht 6' (1.829 m)   Wt 112.5 kg   SpO2 99%   BMI 33.63 kg/m   Physical Exam Vitals and nursing note reviewed.  Constitutional:      Appearance: He is well-developed.  HENT:     Head: Normocephalic and atraumatic.  Eyes:     Conjunctiva/sclera: Conjunctivae normal.  Cardiovascular:     Rate and Rhythm: Normal rate and regular rhythm.     Heart sounds: Normal heart sounds. No murmur heard. Pulmonary:     Effort: Pulmonary effort is normal. No respiratory distress.     Breath sounds: Normal breath sounds. No wheezing, rhonchi or rales.  Abdominal:     General: Bowel sounds are normal.     Palpations: Abdomen is soft.     Tenderness: There is no abdominal tenderness. There is no guarding or rebound.  Musculoskeletal:     Cervical back: Neck supple.  Skin:    General: Skin is warm and dry.  Neurological:     Mental Status: He is alert.     Comments: Mental Status:  Alert, thought content appropriate, able to give a coherent history. Speech fluent without evidence of aphasia. Able to follow 2 step commands without difficulty.  Cranial Nerves:  II:  pupils equal, round, reactive to light III,IV, VI: ptosis not present, extra-ocular motions intact bilaterally  V,VII: smile symmetric, facial light touch sensation equal VIII: hearing grossly normal to  voice  X: uvula elevates symmetrically  XI: bilateral shoulder shrug symmetric and strong XII: midline tongue extension without fassiculations Motor:  Normal tone. 5/5 strength of BUE and BLE major muscle groups including strong and equal grip strength and dorsiflexion/plantar flexion Sensory: light touch normal in all extremities. Cerebellar: normal finger-to-nose with bilateral upper extremities Gait: normal gait and balance.      ED Results / Procedures / Treatments   Labs (all labs ordered are listed, but only abnormal results are displayed) Labs Reviewed  COMPREHENSIVE METABOLIC PANEL - Abnormal; Notable for the following components:      Result Value   Sodium 134 (*)    Glucose, Bld 290 (*)    BUN 30 (*)    All other components within normal limits  BLOOD GAS, VENOUS - Abnormal; Notable for the following components:   pO2, Ven <32.0 (*)    Acid-Base Excess 3.6 (*)    All other components within normal limits  CBG MONITORING, ED - Abnormal; Notable for the following components:   Glucose-Capillary 338 (*)    All other components within normal limits  CBG MONITORING, ED - Abnormal; Notable for the following components:   Glucose-Capillary 213 (*)    All other components within normal limits  CBC    EKG None  Radiology CT Head Wo Contrast  Result Date: 02/19/2021 CLINICAL DATA:  Acute vision loss. Hyperglycemia related to recent steroid administration. EXAM: CT HEAD WITHOUT CONTRAST TECHNIQUE: Contiguous axial images were obtained from the base of the skull through the vertex without intravenous contrast. COMPARISON:  11/04/2003 FINDINGS: Brain: Normal anatomic configuration. No abnormal intra or extra-axial mass lesion or fluid collection. No abnormal mass effect or midline shift. No evidence of acute intracranial hemorrhage or infarct. Ventricular size is normal. Cerebellum unremarkable. Vascular: Unremarkable Skull: Intact Sinuses/Orbits: Paranasal sinuses are clear.  Orbits are unremarkable. Other: Mastoid air cells and middle ear cavities are clear. IMPRESSION: Normal examination; no acute intracranial abnormality. Electronically Signed  By: Fidela Salisbury MD   On: 02/19/2021 21:53    Procedures Procedures   Medications Ordered in ED Medications  sodium chloride 0.9 % bolus 1,000 mL (0 mLs Intravenous Stopped 02/19/21 2142)  sodium chloride 0.9 % bolus 1,000 mL (1,000 mLs Intravenous New Bag/Given 02/19/21 2142)    ED Course  I have reviewed the triage vital signs and the nursing notes.  Pertinent labs & imaging results that were available during my care of the patient were reviewed by me and considered in my medical decision making (see chart for details).    MDM Rules/Calculators/A&P                          55 y/o male presenting for eval of hyperglycemia, blurred vision and fatigue  Reviewed/interpreted labs CBC is unremarkable CMP reveals mild hyponatremia, hyperglycemia and elevated BUN. Normal bicarb and anion gap.  VBG is unremarkable  Patient was given 2 L of IV fluids and on reassessment his blood sugar had improved to 213.  He states that his vision changes completely resolved and he is feeling a lot better after receiving fluids.  We discussed the plan to follow-up closely with his primary care provider about titration of his diabetes medications.  Additionally I did give him information to follow-up with ophthalmology.  His CT head was negative and there is no evidence of a stroke today.  I highly doubt symptoms are related to emergent neurologic problem.  Suspect that this is likely related to his sugars and out of an abundance of caution we will also give him a referral to ophthalmology for further evaluation of his symptoms.  Have advised on strict return precautions.  He voices understanding the plan and reasons to return.  All questions answered.  Patient stable for discharge.  Case was discussed with supervising physician, Dr.  Rogene Houston who who is in agreement with the plan.   Final Clinical Impression(s) / ED Diagnoses Final diagnoses:  Hyperglycemia    Rx / DC Orders ED Discharge Orders     None        Bishop Dublin 02/19/21 2239    Fredia Sorrow, MD 02/23/21 1253

## 2021-02-19 NOTE — Discharge Instructions (Addendum)
Please keep a close eye on your blood sugars at home.  You will need to call your regular doctor tomorrow and inform them of your visit to the emergency department.  I would really recommend that you see your doctor within the next 5 to 7 days for reassessment and due to your continued elevated blood sugars you may need to have your medications titrated again to help achieve better control of them.  Please monitor your symptoms closely and return to the emergency department for any new or worsening symptoms.  I have also given you information to follow-up with an ophthalmologist in regards to the symptoms of blurred vision that you developed temporarily today.  Please call them to schedule an appointment for follow-up.  If you have any recurrence of vision changes or any stroke like symptoms you will need to return to the emergency department immediately.

## 2021-02-19 NOTE — ED Notes (Signed)
Date and time results received: 02/19/21 2140 (use smartphrase ".now" to insert current time)  Test: VBG pO2 Critical Value: < 32  Name of Provider Notified: Deretha Emory, MD

## 2021-02-19 NOTE — ED Triage Notes (Signed)
Pt states he was here Friday for hyperglycemia r/t recently being placed on steroids. Pt states that his sugars have not improved since being discharged.

## 2021-02-21 ENCOUNTER — Other Ambulatory Visit: Payer: Self-pay

## 2021-02-23 ENCOUNTER — Other Ambulatory Visit: Payer: Self-pay

## 2021-02-23 ENCOUNTER — Ambulatory Visit (INDEPENDENT_AMBULATORY_CARE_PROVIDER_SITE_OTHER): Payer: Medicare PPO | Admitting: Family Medicine

## 2021-02-23 VITALS — BP 134/86 | HR 86 | Temp 97.2°F | Ht 72.0 in | Wt 246.1 lb

## 2021-02-23 DIAGNOSIS — E1165 Type 2 diabetes mellitus with hyperglycemia: Secondary | ICD-10-CM | POA: Diagnosis not present

## 2021-02-23 MED ORDER — LANTUS SOLOSTAR 100 UNIT/ML ~~LOC~~ SOPN: 10.0000 [IU] | PEN_INJECTOR | Freq: Every day | SUBCUTANEOUS | 1 refills | Status: DC

## 2021-02-23 MED ORDER — PEN NEEDLES 31G X 5 MM MISC: 3 refills | Status: DC

## 2021-02-23 MED ORDER — INSULIN PEN NEEDLE 31G X 5 MM MISC: 3 refills | Status: DC

## 2021-02-23 MED ORDER — INSULIN GLARGINE 100 UNIT/ML ~~LOC~~ SOLN: 10.0000 [IU] | Freq: Every day | SUBCUTANEOUS | 1 refills | Status: DC

## 2021-02-23 NOTE — Progress Notes (Signed)
 Patient ID: Craig Robertson, male    DOB: 09/25/1965, 55 y.o.   MRN: [id number]   Chief Complaint  Patient presents with   ER follow up    Elevated blood sugar- FBS 279 this am   Subjective:    HPI Pt seen in ER for hyperglycemia- with history of type 2 DM. In ER glucose was 457.  Pt had been seen by NP on 02/09/21 and pcp on 01/16/21 - and given steroids for coughing and URI symptoms.  Pt was not keeping up with his BG readings.  Pt stating went out of town and didn't bring his meter to check glucose.  Was taking his metformin.  Stating was eating higher carbs while on vacation.  They gave insulin and fluids in ER. And and 374 when leaving the ER. Was still sick and went back to ER on 02/19/21.  Had head ct was negative.  Then gave 2 bag fluid and then glucose came down to 213.  Now it's btw 200-300 since being home from ER.  At time hyperglycemia pt was only taking metformin 500mg 1 per day, was prescribed as 2x per day. Since being in ER pt is on Metformin 500mg, taking bid. Glipizide 5mg bid was added on 02/17/21.  Medical History Craig Robertson has a past medical history of Arthritis, Complication of anesthesia, Diabetes mellitus without complication (HCC), Dyspnea, ED (erectile dysfunction), GERD (gastroesophageal reflux disease), Hyperlipidemia, Hypertension, Low testosterone, Sleep apnea, and Stroke ([facility]) (2015).   Outpatient Encounter Medications as of 02/23/2021  Medication Sig   insulin glargine (LANTUS) 100 UNIT/ML injection Inject 0.1 mLs (10 Units total) into the skin at bedtime.   Insulin Pen Needle 31G X 5 MM MISC Use needle as directed   [DISCONTINUED] insulin glargine (LANTUS SOLOSTAR) 100 UNIT/ML Solostar Pen Inject 10 Units into the skin at bedtime.   [DISCONTINUED] Insulin Pen Needle (PEN NEEDLES) 31G X 5 MM MISC Use needles as directed.   albuterol (VENTOLIN HFA) 108 (90 Base) MCG/ACT inhaler Inhale 2 puffs into the lungs every 6 (six) hours as needed for wheezing or  shortness of breath.   Ascorbic Acid (VITAMIN C) 1000 MG tablet Take 1,000 mg by mouth daily.   aspirin EC 81 MG tablet Take 81 mg by mouth daily. Swallow whole.   atorvastatin (LIPITOR) 40 MG tablet TAKE 1 TABLET BY MOUTH EVERY DAY   blood glucose meter kit and supplies KIT Dispense based on patient and insurance preference. Use up to test glucose once daily. ICD 10 code E11.9.   famotidine (PEPCID) 20 MG tablet One after supper (Patient taking differently: Take 20 mg by mouth daily with supper.)   glipiZIDE (GLUCOTROL) 5 MG tablet Take 1 tablet (5 mg total) by mouth 2 (two) times daily before a meal.   metFORMIN (GLUCOPHAGE) 500 MG tablet 1 tab p.o. daily with meal. (Patient taking differently: Take 500 mg by mouth daily with breakfast.)   Multiple Vitamin (MULTIVITAMIN) tablet Take 1 tablet by mouth daily.   ONETOUCH VERIO test strip TEST SUGAR DAILY   pantoprazole (PROTONIX) 40 MG tablet Take 1 tablet (40 mg total) by mouth 2 (two) times daily before a meal.   tadalafil (CIALIS) 5 MG tablet TAKE 1 TABLET BY MOUTH DAILY AS NEEDED FOR ERECTILE DYSFUNCTION (Patient taking differently: Take 5 mg by mouth daily as needed. TAKE 1 TABLET BY MOUTH DAILY AS NEEDED FOR ERECTILE DYSFUNCTION)   traMADol (ULTRAM) 50 MG tablet Take 1 tablet (50 mg total) by mouth every   6 (six) hours as needed.   valsartan (DIOVAN) 160 MG tablet Take 1 tablet (160 mg total) by mouth daily.   verapamil (VERELAN PM) 180 MG 24 hr capsule TAKE 1 CAPSULE BY MOUTH TWICE A DAY   No facility-administered encounter medications on file as of 02/23/2021.     Review of Systems  Constitutional:  Negative for chills and fever.  HENT:  Negative for congestion, rhinorrhea and sore throat.   Respiratory:  Negative for cough, shortness of breath and wheezing.   Cardiovascular:  Negative for chest pain and leg swelling.  Gastrointestinal:  Negative for abdominal pain, diarrhea, nausea and vomiting.  Endocrine: Positive for polyuria.   Genitourinary:  Negative for dysuria and frequency.  Skin:  Negative for rash.  Neurological:  Negative for dizziness, weakness and headaches.    Vitals BP 134/86   Pulse 86   Temp (!) 97.2 F (36.2 C) (Oral)   Ht 6' (1.829 m)   Wt 246 lb 1.6 oz (111.6 kg)   BMI 33.38 kg/m   Objective:   Physical Exam Vitals and nursing note reviewed.  Constitutional:      General: He is not in acute distress.    Appearance: Normal appearance. He is not ill-appearing.  HENT:     Head: Normocephalic.     Nose: Nose normal. No congestion.     Mouth/Throat:     Mouth: Mucous membranes are moist.     Pharynx: No oropharyngeal exudate.  Eyes:     Extraocular Movements: Extraocular movements intact.     Conjunctiva/sclera: Conjunctivae normal.     Pupils: Pupils are equal, round, and reactive to light.  Cardiovascular:     Rate and Rhythm: Normal rate and regular rhythm.     Pulses: Normal pulses.     Heart sounds: Normal heart sounds. No murmur heard. Pulmonary:     Effort: Pulmonary effort is normal.     Breath sounds: Normal breath sounds. No wheezing, rhonchi or rales.  Musculoskeletal:        General: Normal range of motion.     Right lower leg: No edema.     Left lower leg: No edema.  Skin:    General: Skin is warm and dry.     Findings: No rash.  Neurological:     General: No focal deficit present.     Mental Status: He is alert and oriented to person, place, and time.     Cranial Nerves: No cranial nerve deficit.  Psychiatric:        Mood and Affect: Mood normal.        Behavior: Behavior normal.        Thought Content: Thought content normal.        Judgment: Judgment normal.     Assessment and Plan   1. Type 2 diabetes mellitus with hyperglycemia, without long-term current use of insulin (HCC) - insulin glargine (LANTUS) 100 UNIT/ML injection; Inject 0.1 mLs (10 Units total) into the skin at bedtime.  Dispense: 15 mL; Refill: 1 - Insulin Pen Needle 31G X 5 MM MISC;  Use needle as directed  Dispense: 50 each; Refill: 3   DM2- uncontrolled.  Reviewed with pt how to use lantus pen and needles. Pt to take this qhs.  Call if still seeing numbers over 200.  Pt to continue with metformin and glipizide. Reviewed diet modifications and dec carbs.    Pt in agreement.  Pt to f/u with new provider on 03/23/21 as scheduled. Pt  stating he was going to see Dr. Patel in Sept. 2022.  Return in about 4 weeks (around 03/23/2021) for pt f/u with new pcp - dM2.  

## 2021-03-03 DIAGNOSIS — H35363 Drusen (degenerative) of macula, bilateral: Secondary | ICD-10-CM | POA: Diagnosis not present

## 2021-03-03 DIAGNOSIS — H524 Presbyopia: Secondary | ICD-10-CM | POA: Diagnosis not present

## 2021-03-03 DIAGNOSIS — E119 Type 2 diabetes mellitus without complications: Secondary | ICD-10-CM | POA: Diagnosis not present

## 2021-03-03 LAB — HM DIABETES EYE EXAM

## 2021-03-08 ENCOUNTER — Telehealth: Payer: Self-pay | Admitting: Family Medicine

## 2021-03-08 NOTE — Telephone Encounter (Signed)
Ok great numbers.  He can stop the lantus at night for the next week and see how his glucose is running to continue to check in mornings.  If going back up then go back to the 10 units lantus at night.   Thx,   Dr. Ladona Ridgel

## 2021-03-08 NOTE — Telephone Encounter (Signed)
Patient advised per Dr Ladona Ridgel: [place] great numbers.  He can stop the lantus at night for the next week and see how his glucose is running to continue to check in mornings.  If going back up then go back to the 10 units lantus at night.  Patient verbalized understanding.

## 2021-03-08 NOTE — Telephone Encounter (Signed)
Patient states he sugar just seemed to finally go down 8/11 and is lower every morning since  03/01/21- 278 03/02/21-146 03/03/21- 126 03/04/21-104 03/05/21- 102 03/06/21-100 03/07/21-98 03/08/21-88  Currently on Metformin 500mg  BID                      Glipizide 5mg  BID                      Lantus 10 units qhs Patient states he has cut out carbs and sugar- eating a lot of salads

## 2021-03-08 NOTE — Telephone Encounter (Signed)
Patient states blood sugar is at 88 now. He wanting to know if he needs to continue medication. Please advise

## 2021-03-10 ENCOUNTER — Telehealth: Payer: Self-pay | Admitting: Family Medicine

## 2021-03-11 ENCOUNTER — Other Ambulatory Visit: Payer: Self-pay | Admitting: Family Medicine

## 2021-03-13 NOTE — Telephone Encounter (Signed)
Pt has refill 7/29, given 60 tab with 1 refill, will not run out till 9/29.  Pt to get further refills from new provider.   Dr. Ladona Ridgel

## 2021-03-17 NOTE — Telephone Encounter (Signed)
Error please close

## 2021-03-20 ENCOUNTER — Other Ambulatory Visit: Payer: Self-pay | Admitting: Family Medicine

## 2021-03-20 ENCOUNTER — Other Ambulatory Visit: Payer: Self-pay | Admitting: Internal Medicine

## 2021-03-20 DIAGNOSIS — I10 Essential (primary) hypertension: Secondary | ICD-10-CM

## 2021-03-20 MED ORDER — VALSARTAN 160 MG PO TABS: 160.0000 mg | ORAL_TABLET | Freq: Every day | ORAL | 2 refills | Status: DC

## 2021-03-20 NOTE — Telephone Encounter (Signed)
Patient is requesting refill on valsartan 160 mg completely out.please advise [facility] [address]

## 2021-03-23 ENCOUNTER — Ambulatory Visit (INDEPENDENT_AMBULATORY_CARE_PROVIDER_SITE_OTHER): Payer: Medicare PPO | Admitting: Internal Medicine

## 2021-03-23 ENCOUNTER — Other Ambulatory Visit: Payer: Self-pay

## 2021-03-23 ENCOUNTER — Other Ambulatory Visit: Payer: Self-pay | Admitting: *Deleted

## 2021-03-23 ENCOUNTER — Encounter: Payer: Self-pay | Admitting: Internal Medicine

## 2021-03-23 VITALS — BP 136/78 | HR 73 | Temp 98.4°F | Resp 18 | Ht 71.0 in | Wt 261.1 lb

## 2021-03-23 DIAGNOSIS — Z0001 Encounter for general adult medical examination with abnormal findings: Secondary | ICD-10-CM | POA: Diagnosis not present

## 2021-03-23 DIAGNOSIS — I639 Cerebral infarction, unspecified: Secondary | ICD-10-CM | POA: Diagnosis not present

## 2021-03-23 DIAGNOSIS — Z23 Encounter for immunization: Secondary | ICD-10-CM

## 2021-03-23 DIAGNOSIS — Z7689 Persons encountering health services in other specified circumstances: Secondary | ICD-10-CM

## 2021-03-23 DIAGNOSIS — N5201 Erectile dysfunction due to arterial insufficiency: Secondary | ICD-10-CM | POA: Diagnosis not present

## 2021-03-23 DIAGNOSIS — G4733 Obstructive sleep apnea (adult) (pediatric): Secondary | ICD-10-CM

## 2021-03-23 DIAGNOSIS — I10 Essential (primary) hypertension: Secondary | ICD-10-CM | POA: Diagnosis not present

## 2021-03-23 DIAGNOSIS — E119 Type 2 diabetes mellitus without complications: Secondary | ICD-10-CM | POA: Diagnosis not present

## 2021-03-23 DIAGNOSIS — K219 Gastro-esophageal reflux disease without esophagitis: Secondary | ICD-10-CM

## 2021-03-23 DIAGNOSIS — Z0000 Encounter for general adult medical examination without abnormal findings: Secondary | ICD-10-CM

## 2021-03-23 MED ORDER — EMPAGLIFLOZIN 25 MG PO TABS: 25.0000 mg | ORAL_TABLET | Freq: Every day | ORAL | 2 refills | Status: DC

## 2021-03-23 MED ORDER — ONETOUCH ULTRASOFT LANCETS MISC: 12 refills | Status: AC

## 2021-03-23 NOTE — Assessment & Plan Note (Signed)
BP Readings from Last 1 Encounters:  03/23/21 136/78   Well-controlled with Valsartan and Verapamil Plan to switch to Amlodipine in the next visit Counseled for compliance with the medications Advised DASH diet and moderate exercise/walking, at least 150 mins/week

## 2021-03-23 NOTE — Assessment & Plan Note (Signed)
Cialis PRN 

## 2021-03-23 NOTE — Assessment & Plan Note (Signed)
Uses CPAP regularly °

## 2021-03-23 NOTE — Assessment & Plan Note (Signed)
Lab Results  Component Value Date   HGBA1C 6.6 (H) 08/03/2020   On Metformin 500 mg BID, Glipizide 5 mg BID and Lantus 10 U qHS Blood glucose around 80s at home DC Lantus and start Jardiance Advised to follow diabetic diet On statin and ARB F/u CMP and lipid panel Diabetic eye exam: Advised to follow up with Ophthalmology for diabetic eye exam

## 2021-03-23 NOTE — Assessment & Plan Note (Signed)
Care established Previous chart reviewed History and medications reviewed with the patient 

## 2021-03-23 NOTE — Patient Instructions (Addendum)
Please stop taking Lantus and start taking Jardiance.  Please continue to take other medications as prescribed.  Please follow low carb and low salt diet and continue to perform moderate exercise/walking at least 150 mins/week.  Please get fasting blood tests done before the next visit.

## 2021-03-23 NOTE — Assessment & Plan Note (Signed)
Takes Pantoprazole in AM and Pepcid in PM

## 2021-03-23 NOTE — Assessment & Plan Note (Signed)
Had residual right sided hemiparesis, which is improved now On Aspirin and statin 

## 2021-03-23 NOTE — Progress Notes (Signed)
New Patient Office Visit  Subjective:  Patient ID: Craig Robertson, male    DOB: 1965/11/21  Age: 55 y.o. MRN: [id number]  CC:  Chief Complaint  Patient presents with   New Patient (Initial Visit)    New patient was seeing dr Wolfgang [place] pt was wheezing at night was given prednisone and zpack and got a little better but went back and was seen again and given another dose of prednisone because of this he had to go to hospital as blood sugars were extremely high and he was dehydrated    HPI Craig Robertson is a 55 year old male with PMH of HTN, CVA with residual right sided weakness, type 2 DM, OSA, GERD, lumbar radiculopathy and morbid obesity who presents for establishing care. He is a former patient of Dr Luking/Dr Lovena Le.  DM: He recently had an ER visit for hyperglycemia due to steroid use for viral URI. He has been taking Metformin, Glipizide and Lantus regularly. His blood glucose at home are well-controlled now. Denies any polyuria or polyphagia currently.  CVA: In 2015, had residual right sided hemiplegia, which has improved now, but still has mild weakness on right side. He is on Aspirin and statin.  HTN: BP is well-controlled. Takes medications regularly. Patient denies headache, dizziness, chest pain, dyspnea or palpitations.  OSA: Uses CPAP regularly.  He has had 3 doses of COVID vaccine. He received flu vaccine in the office today.  Past Medical History:  Diagnosis Date   Arthritis    Complication of anesthesia    "slow to wake"   Diabetes mellitus without complication ([place])    Dyspnea    allegy season    ED (erectile dysfunction)    GERD (gastroesophageal reflux disease)    Hyperlipidemia    Hypertension    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 12/08/2013   Low testosterone    Sleep apnea    on CPAP    Stroke ([place]) 2015   TIA     Past Surgical History:  Procedure Laterality Date   BACK SURGERY     INSERTION OF MESH Left 06/14/2020   Procedure: INSERTION OF  MESH;  Surgeon: Ralene Ok, MD;  Location: [facility];  Service: General;  Laterality: Left;   LUMBAR [place] [facility]GERY  2015   lumbar surgery for bulging disc at surgery center    tissue reduction     in chest when he was 55 years old   TOTAL HIP ARTHROPLASTY Left 03/05/2017   Procedure: LEFT TOTAL HIP ARTHROPLASTY ANTERIOR APPROACH;  Surgeon: Paralee Cancel, MD;  Location: [facility];  Service: Orthopedics;  Laterality: Left;  70 mins   XI ROBOTIC ASSISTED INGUINAL HERNIA REPAIR WITH MESH Left 06/14/2020   Procedure: XI ROBOTIC ASSISTED LEFT INGUINAL HERNIA REPAIR WITH MESH;  Surgeon: Ralene Ok, MD;  Location: [facility];  Service: General;  Laterality: Left;    Family History  Problem Relation Age of Onset   Diabetes Cousin    Diabetes Mother    Hypertension Father    Leukemia Father    Hypertension Brother    Hypertension Sister    Diabetes Paternal Aunt    Diabetes Paternal Uncle    Hypertension Brother    Hypertension Brother    Hypertension Brother    Hypertension Sister     Social History   Socioeconomic History   Marital status: Married    Spouse name: Not on file   Number of children: 4   Years of education: Not on  file   Highest education level: Not on file  Occupational History   Occupation: tobacco industry  Tobacco Use   Smoking status: Never   Smokeless tobacco: Never  Vaping Use   Vaping Use: Never used  Substance and Sexual Activity   Alcohol use: No    Alcohol/week: 0.0 standard drinks   Drug use: No   Sexual activity: Not on file  Other Topics Concern   Not on file  Social History Narrative   Not on file   Social Determinants of Health   Financial Resource Strain: Not on file  Food Insecurity: Not on file  Transportation Needs: Not on file  Physical Activity: Not on file  Stress: Not on file  Social Connections: Not on file  Intimate Partner Violence: Not on file    ROS Review of Systems  Constitutional:  Negative for chills and fever.   HENT:  Negative for congestion and sore throat.   Eyes:  Negative for pain and discharge.  Respiratory:  Negative for cough and shortness of breath.   Cardiovascular:  Negative for chest pain and palpitations.  Gastrointestinal:  Negative for constipation, diarrhea, nausea and vomiting.  Endocrine: Negative for polydipsia and polyuria.  Genitourinary:  Negative for dysuria and hematuria.  Musculoskeletal:  Negative for neck pain and neck stiffness.  Skin:  Negative for rash.  Neurological:  Negative for dizziness, weakness, numbness and headaches.  Psychiatric/Behavioral:  Negative for agitation and behavioral problems.    Objective:   Today's Vitals: BP 136/78 (BP Location: Left Arm, Patient Position: Sitting, Cuff Size: Normal)   Pulse 73   Temp 98.4 F (36.9 C) (Oral)   Resp 18   Ht 5' 11"  (1.803 m)   Wt 261 lb 1.3 oz (118.4 kg)   SpO2 96%   BMI 36.41 kg/m   Physical Exam Vitals reviewed.  Constitutional:      General: He is not in acute distress.    Appearance: He is obese. He is not diaphoretic.  HENT:     Head: Normocephalic and atraumatic.     Nose: Nose normal.     Mouth/Throat:     Mouth: Mucous membranes are moist.  Eyes:     General: No scleral icterus.    Extraocular Movements: Extraocular movements intact.  Cardiovascular:     Rate and Rhythm: Normal rate and regular rhythm.     Pulses: Normal pulses.     Heart sounds: Normal heart sounds. No murmur heard. Pulmonary:     Breath sounds: Normal breath sounds. No wheezing or rales.  Abdominal:     Palpations: Abdomen is soft.     Tenderness: There is no abdominal tenderness.  Musculoskeletal:     Cervical back: Neck supple. No tenderness.     Right lower leg: No edema.     Left lower leg: No edema.  Skin:    General: Skin is warm.     Findings: No rash.  Neurological:     General: No focal deficit present.     Mental Status: He is alert and oriented to person, place, and time.     Sensory: No  sensory deficit.     Motor: Weakness (4+ in right UE, 5+ in left UE) present.  Psychiatric:        Mood and Affect: Mood normal.        Behavior: Behavior normal.    Assessment & Plan:   Problem List Items Addressed This Visit       Encounter  to establish care - Primary   Care established Previous chart reviewed History and medications reviewed with the patient       Cardiovascular and Mediastinum   Essential hypertension, benign (Chronic)    BP Readings from Last 1 Encounters:  03/23/21 136/78  Well-controlled with Valsartan and Verapamil Plan to switch to Amlodipine in the next visit Counseled for compliance with the medications Advised DASH diet and moderate exercise/walking, at least 150 mins/week       Relevant Orders   CMP14+EGFR   Cerebrovascular accident (stroke) ([place])    Had residual right sided hemiparesis, which is improved now On Aspirin and statin        Respiratory   Obstructive sleep apnea    Uses CPAP regularly        Digestive   Gastroesophageal reflux disease without esophagitis    Takes Pantoprazole in AM and Pepcid in PM        Endocrine   Type 2 diabetes mellitus ([place])    Lab Results  Component Value Date   HGBA1C 6.6 (H) 08/03/2020  On Metformin 500 mg BID, Glipizide 5 mg BID and Lantus 10 U qHS Blood glucose around 80s at home DC Lantus and start Jardiance Advised to follow diabetic diet On statin and ARB F/u CMP and lipid panel Diabetic eye exam: Advised to follow up with Ophthalmology for diabetic eye exam       Relevant Medications   empagliflozin (JARDIANCE) 25 MG TABS tablet   Other Relevant Orders   Lipid panel   HgB A1c     Other   Erectile dysfunction    Cialis PRN            Other Visit Diagnoses     Need for immunization against influenza     Patient was educated on the recommendation for flu vaccine. After obtaining informed consent, the vaccine was administered no adverse effects noted at time of  administration. Patient provided with education on arm soreness and use of tylenol or ibuprofen (if safe) for this. Encourage to use the arm vaccine was given in to help reduce the soreness. Patient educated on the signs of a reaction to the vaccine and advised to contact the office should these occur.   Relevant Orders   Flu Vaccine QUAD 52moIM (Fluarix, Fluzone & Alfiuria Quad PF) (Completed)     Outpatient Encounter Medications as of 03/23/2021  Medication Sig   albuterol (VENTOLIN HFA) 108 (90 Base) MCG/ACT inhaler Inhale 2 puffs into the lungs every 6 (six) hours as needed for wheezing or shortness of breath.   aspirin EC 81 MG tablet Take 81 mg by mouth daily. Swallow whole.   atorvastatin (LIPITOR) 40 MG tablet TAKE 1 TABLET BY MOUTH EVERY DAY   blood glucose meter kit and supplies KIT Dispense based on patient and insurance preference. Use up to test glucose once daily. ICD 10 code E11.9.   empagliflozin (JARDIANCE) 25 MG TABS tablet Take 1 tablet (25 mg total) by mouth daily before breakfast.   famotidine (PEPCID) 20 MG tablet One after supper (Patient taking differently: Take 20 mg by mouth daily with supper.)   glipiZIDE (GLUCOTROL) 5 MG tablet Take 1 tablet (5 mg total) by mouth 2 (two) times daily before a meal.   Insulin Pen Needle 31G X 5 MM MISC Use needle as directed   metFORMIN (GLUCOPHAGE) 500 MG tablet 1 tab p.o. daily with meal. (Patient taking differently: Take 500 mg by mouth daily with breakfast.)  Multiple Vitamin (MULTIVITAMIN) tablet Take 1 tablet by mouth daily.   ONETOUCH VERIO test strip TEST SUGAR DAILY   pantoprazole (PROTONIX) 40 MG tablet Take 1 tablet (40 mg total) by mouth 2 (two) times daily before a meal.   tadalafil (CIALIS) 5 MG tablet TAKE 1 TABLET BY MOUTH DAILY AS NEEDED FOR ERECTILE DYSFUNCTION (Patient taking differently: Take 5 mg by mouth daily as needed. TAKE 1 TABLET BY MOUTH DAILY AS NEEDED FOR ERECTILE DYSFUNCTION)   traMADol (ULTRAM) 50 MG  tablet Take 1 tablet (50 mg total) by mouth every 6 (six) hours as needed.   valsartan (DIOVAN) 160 MG tablet Take 1 tablet (160 mg total) by mouth daily.   verapamil (VERELAN PM) 180 MG 24 hr capsule TAKE 1 CAPSULE BY MOUTH TWICE A DAY   [DISCONTINUED] insulin glargine (LANTUS) 100 UNIT/ML injection Inject 0.1 mLs (10 Units total) into the skin at bedtime.   [DISCONTINUED] Ascorbic Acid (VITAMIN C) 1000 MG tablet Take 1,000 mg by mouth daily. (Patient not taking: Reported on 03/23/2021)   No facility-administered encounter medications on file as of 03/23/2021.    Follow-up: Return in about 3 months (around 06/22/2021) for Annual physical.   Lindell Spar, MD

## 2021-03-26 ENCOUNTER — Other Ambulatory Visit: Payer: Self-pay

## 2021-03-26 ENCOUNTER — Encounter: Payer: Medicare PPO | Admitting: *Deleted

## 2021-03-26 NOTE — Progress Notes (Signed)
This was a tele health visit. Patient did not answer the phone.       This encounter was created in error - please disregard.

## 2021-03-29 ENCOUNTER — Ambulatory Visit (INDEPENDENT_AMBULATORY_CARE_PROVIDER_SITE_OTHER): Payer: Medicare PPO

## 2021-03-29 ENCOUNTER — Other Ambulatory Visit: Payer: Self-pay

## 2021-03-29 DIAGNOSIS — Z0000 Encounter for general adult medical examination without abnormal findings: Secondary | ICD-10-CM | POA: Diagnosis not present

## 2021-03-29 NOTE — Patient Instructions (Signed)
Health Maintenance, Male Adopting a healthy lifestyle and getting preventive care are important in promoting health and wellness. Ask your health care provider about: The right schedule for you to have regular tests and exams. Things you can do on your own to prevent diseases and keep yourself healthy. What should I know about diet, weight, and exercise? Eat a healthy diet  Eat a diet that includes plenty of vegetables, fruits, low-fat dairy products, and lean protein. Do not eat a lot of foods that are high in solid fats, added sugars, or sodium. Maintain a healthy weight Body mass index (BMI) is a measurement that can be used to identify possible weight problems. It estimates body fat based on height and weight. Your health care provider can help determine your BMI and help you achieve or maintain a healthy weight. Get regular exercise Get regular exercise. This is one of the most important things you can do for your health. Most adults should: Exercise for at least 150 minutes each week. The exercise should increase your heart rate and make you sweat (moderate-intensity exercise). Do strengthening exercises at least twice a week. This is in addition to the moderate-intensity exercise. Spend less time sitting. Even light physical activity can be beneficial. Watch cholesterol and blood lipids Have your blood tested for lipids and cholesterol at 53 years of age, then have this test every 5 years. You may need to have your cholesterol levels checked more often if: Your lipid or cholesterol levels are high. You are older than 53 years of age. You are at high risk for heart disease. What should I know about cancer screening? Many types of cancers can be detected early and may often be prevented. Depending on your health history and family history, you may need to have cancer screening at various ages. This may include screening for: Colorectal cancer. Prostate cancer. Skin cancer. Lung  cancer. What should I know about heart disease, diabetes, and high blood pressure? Blood pressure and heart disease High blood pressure causes heart disease and increases the risk of stroke. This is more likely to develop in people who have high blood pressure readings, are of African descent, or are overweight. Talk with your health care provider about your target blood pressure readings. Have your blood pressure checked: Every 3-5 years if you are 18-39 years of age. Every year if you are 40 years old or older. If you are between the ages of 65 and 75 and are a current or former smoker, ask your health care provider if you should have a one-time screening for abdominal aortic aneurysm (AAA). Diabetes Have regular diabetes screenings. This checks your fasting blood sugar level. Have the screening done: Once every three years after age 45 if you are at a normal weight and have a low risk for diabetes. More often and at a younger age if you are overweight or have a high risk for diabetes. What should I know about preventing infection? Hepatitis B If you have a higher risk for hepatitis B, you should be screened for this virus. Talk with your health care provider to find out if you are at risk for hepatitis B infection. Hepatitis C Blood testing is recommended for: Everyone born from 1945 through 1965. Anyone with known risk factors for hepatitis C. Sexually transmitted infections (STIs) You should be screened each year for STIs, including gonorrhea and chlamydia, if: You are sexually active and are younger than 53 years of age. You are older than 53 years   of age and your health care provider tells you that you are at risk for this type of infection. Your sexual activity has changed since you were last screened, and you are at increased risk for chlamydia or gonorrhea. Ask your health care provider if you are at risk. Ask your health care provider about whether you are at high risk for HIV.  Your health care provider may recommend a prescription medicine to help prevent HIV infection. If you choose to take medicine to prevent HIV, you should first get tested for HIV. You should then be tested every 3 months for as long as you are taking the medicine. Follow these instructions at home: Lifestyle Do not use any products that contain nicotine or tobacco, such as cigarettes, e-cigarettes, and chewing tobacco. If you need help quitting, ask your health care provider. Do not use street drugs. Do not share needles. Ask your health care provider for help if you need support or information about quitting drugs. Alcohol use Do not drink alcohol if your health care provider tells you not to drink. If you drink alcohol: Limit how much you have to 0-2 drinks a day. Be aware of how much alcohol is in your drink. In the [place] one drink equals one 12 oz bottle of beer (355 mL), one 5 oz glass of wine (148 mL), or one 1 oz glass of hard liquor (44 mL). General instructions Schedule regular health, dental, and eye exams. Stay current with your vaccines. Tell your health care provider if: You often feel depressed. You have ever been abused or do not feel safe at home. Summary Adopting a healthy lifestyle and getting preventive care are important in promoting health and wellness. Follow your health care provider's instructions about healthy diet, exercising, and getting tested or screened for diseases. Follow your health care provider's instructions on monitoring your cholesterol and blood pressure. This information is not intended to replace advice given to you by your health care provider. Make sure you discuss any questions you have with your health care provider. Document Revised: 09/16/2020 Document Reviewed: 07/02/2018 Elsevier Patient Education  2022 [employer]  

## 2021-03-29 NOTE — Progress Notes (Signed)
Subjective:   Craig Robertson is a 55 y.o. male who presents for an Initial Medicare Annual Wellness Visit. I connected with  Craig Robertson on 03/29/21 by a audio enabled telemedicine application and verified that I am speaking with the correct person using two identifiers.   I discussed the limitations of evaluation and management by telemedicine. The patient expressed understanding and agreed to proceed.   Location of patient:Home   Location of Provider:Office  Persons participating in virtual visit: Craig Robertson (patient) and Valli Glance Review of Systems    Defer to PCP       Objective:    Today's Vitals   03/29/21 1620  PainSc: 5    There is no height or weight on file to calculate BMI.  Advanced Directives 03/29/2021 02/19/2021 02/17/2021 06/10/2020 03/05/2017 02/27/2017 01/11/2017  Does Patient Have a Medical Advance Directive? No No No No No No No  Would patient like information on creating a medical advance directive? - No - Patient declined No - Patient declined No - Patient declined No - Patient declined No - Patient declined -    Current Medications (verified) Outpatient Encounter Medications as of 03/29/2021  Medication Sig   albuterol (VENTOLIN HFA) 108 (90 Base) MCG/ACT inhaler Inhale 2 puffs into the lungs every 6 (six) hours as needed for wheezing or shortness of breath.   aspirin EC 81 MG tablet Take 81 mg by mouth daily. Swallow whole.   atorvastatin (LIPITOR) 40 MG tablet TAKE 1 TABLET BY MOUTH EVERY DAY   blood glucose meter kit and supplies KIT Dispense based on patient and insurance preference. Use up to test glucose once daily. ICD 10 code E11.9.   empagliflozin (JARDIANCE) 25 MG TABS tablet Take 1 tablet (25 mg total) by mouth daily before breakfast.   famotidine (PEPCID) 20 MG tablet One after supper (Patient taking differently: Take 20 mg by mouth daily with supper.)   glipiZIDE (GLUCOTROL) 5 MG tablet Take 1 tablet (5 mg total) by mouth 2 (two) times  daily before a meal.   Lancets (ONETOUCH ULTRASOFT) lancets Use as instructed   metFORMIN (GLUCOPHAGE) 500 MG tablet 1 tab p.o. daily with meal. (Patient taking differently: Take 500 mg by mouth daily with breakfast.)   Multiple Vitamin (MULTIVITAMIN) tablet Take 1 tablet by mouth daily.   ONETOUCH VERIO test strip TEST SUGAR DAILY   pantoprazole (PROTONIX) 40 MG tablet Take 1 tablet (40 mg total) by mouth 2 (two) times daily before a meal.   tadalafil (CIALIS) 5 MG tablet TAKE 1 TABLET BY MOUTH DAILY AS NEEDED FOR ERECTILE DYSFUNCTION (Patient taking differently: Take 5 mg by mouth daily as needed. TAKE 1 TABLET BY MOUTH DAILY AS NEEDED FOR ERECTILE DYSFUNCTION)   traMADol (ULTRAM) 50 MG tablet Take 1 tablet (50 mg total) by mouth every 6 (six) hours as needed.   valsartan (DIOVAN) 160 MG tablet Take 1 tablet (160 mg total) by mouth daily.   verapamil (VERELAN PM) 180 MG 24 hr capsule TAKE 1 CAPSULE BY MOUTH TWICE A DAY   [DISCONTINUED] Insulin Pen Needle 31G X 5 MM MISC Use needle as directed   No facility-administered encounter medications on file as of 03/29/2021.    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   Low testosterone    Sleep apnea    on CPAP    Stroke ([place]) 2015   TIA    Past Surgical History:  Procedure Laterality Date   BACK SURGERY     INSERTION OF MESH Left 06/14/2020   Procedure: INSERTION OF MESH;  Surgeon: Ralene Ok, MD;  Location: [facility];  Service: General;  Laterality: Left;   LUMBAR [place] SURGERY  2015   lumbar surgery for bulging disc at surgery center    tissue reduction     in chest when he was 55 years old   TOTAL HIP  ARTHROPLASTY Left 03/05/2017   Procedure: LEFT TOTAL HIP ARTHROPLASTY ANTERIOR APPROACH;  Surgeon: Paralee Cancel, MD;  Location: [facility];  Service: Orthopedics;  Laterality: Left;  70 mins   XI ROBOTIC ASSISTED INGUINAL HERNIA REPAIR WITH MESH Left 06/14/2020   Procedure: XI ROBOTIC ASSISTED LEFT INGUINAL HERNIA REPAIR WITH MESH;  Surgeon: Ralene Ok, MD;  Location: [facility];  Service: General;  Laterality: Left;   Family History  Problem Relation Age of Onset   Diabetes Cousin    Diabetes Mother    Hypertension Father    Leukemia Father    Hypertension Brother    Hypertension Sister    Diabetes Paternal Aunt    Diabetes Paternal Uncle    Hypertension Brother    Hypertension Brother    Hypertension Brother    Hypertension Sister    Social History   Socioeconomic History   Marital status: Married    Spouse name: Not on file   Number of children: 4   Years of education: Not on file   Highest education level: Not on file  Occupational History   Occupation: tobacco industry  Tobacco Use   Smoking status: Never   Smokeless tobacco: Never  Vaping Use   Vaping Use: Never used  Substance and Sexual Activity   Alcohol use: No    Alcohol/week: 0.0 standard drinks   Drug use: No   Sexual activity: Not on file  Other Topics Concern   Not on file  Social History Narrative   Not on file   Social Determinants of Health   Financial Resource Strain: Low Risk    Difficulty of Paying Living Expenses: Not hard at all  Food Insecurity: No Food Insecurity   Worried About Charity fundraiser in the Last Year: Never true   [place] in the Last Year: Never true  Transportation Needs: No Transportation Needs   Lack of Transportation (Medical): No   Lack of Transportation (Non-Medical): No  Physical Activity: Insufficiently Active   Days of Exercise per Week: 7 days   Minutes of Exercise per Session: 10 min  Stress: No Stress Concern Present   Feeling of Stress : Not at all   Social Connections: Moderately Integrated   Frequency of Communication with Friends and Family: More than three times a week   Frequency of Social Gatherings with Friends and Family: Twice a week   Attends Religious Services: More than 4 times per year   Active Member of [employer] or Organizations: No   Attends Archivist Meetings: Never   Marital Status: Married    Tobacco Counseling Counseling given: Not Answered   Clinical Intake:  Pre-visit preparation completed: Yes  Pain : 0-10 Pain Score: 5  Pain Type: Chronic pain Pain Location: Back Pain Relieving Factors: Tramadol  Pain Relieving Factors: Tramadol     How often do you need to have someone help you when you read instructions, pamphlets, or other written materials from your doctor or pharmacy?: 1 - Never What is the last grade level you completed in school?: High School  Diabetic?YES Nutrition Risk Assessment:  Has the patient had any N/V/D within the last 2 months?  No  Does the patient have any non-healing wounds?  No  Has the patient had any unintentional weight loss or weight gain?  No   Diabetes:  Is the patient diabetic?  Yes  If diabetic, was a CBG obtained today?  Yes  checked at home Did the patient bring in their glucometer from home?  No  How often do you monitor your CBG's? daily.   Financial Strains and Diabetes Management:  Are you having any financial strains with the device, your supplies or your medication? No .  Does the patient want to be seen by Chronic Care Management for management of their diabetes?  No  Would the patient like to be referred to a Nutritionist or for Diabetic Management?  No   Diabetic Exams:  Diabetic Eye Exam: Completed 03/03/2021 Diabetic Foot Exam: Overdue, Pt has been advised about the importance in completing this exam. Pt is scheduled for diabetic foot exam on 06/26/2021.   Interpreter Needed?: No  Information entered by :: Nozomi Mettler J,CMA   Activities  of Daily Living In your present state of health, do you have any difficulty performing the following activities: 03/29/2021 06/10/2020  Hearing? N N  Vision? N N  Difficulty concentrating or making decisions? N N  Walking or climbing stairs? N N  Dressing or bathing? N N  Doing errands, shopping? N N  Preparing Food and eating ? N -  Using the Toilet? N -  In the past six months, have you accidently leaked urine? N -  Do you have problems with loss of bowel control? N -  Managing your Medications? N -  Managing your Finances? N -  Housekeeping or managing your Housekeeping? N -  Some recent data might be hidden    Patient Care Team: Lindell Spar, MD as PCP - General (Internal Medicine)  Indicate any recent Medical Services you may have received from other than [facility] providers in the past year (date may be approximate).     Assessment:   This is a routine wellness examination for Craig Robertson.  Hearing/Vision screen No results found.  Dietary issues and exercise activities discussed: Current Exercise Habits: Home exercise routine, Type of exercise: walking, Time (Minutes): 10, Frequency (Times/Week): 7, Weekly Exercise (Minutes/Week): 70, Intensity: Mild   Goals Addressed   None    Depression Screen PHQ 2/9 Scores 03/29/2021 03/23/2021 08/02/2020 05/09/2018 09/30/2017  PHQ - 2 Score 0 0 0 0 0  PHQ- 9 Score - - - 0 0    Fall Risk Fall Risk  03/29/2021 03/23/2021 05/09/2018  Falls in the past year? 0 0 No  Number falls in past yr: 0 0 -  Injury with Fall? 0 0 -  Risk for fall due to : No Fall Risks No Fall Risks -  Follow up Falls evaluation completed Falls evaluation completed -    FALL RISK PREVENTION PERTAINING TO THE HOME:  Any stairs in or around the home? Yes  If so, are there any without handrails? No  Home free of loose throw rugs  in walkways, pet beds, electrical cords, etc? Yes  Adequate lighting in your home to reduce risk of falls? Yes   ASSISTIVE DEVICES UTILIZED  TO PREVENT FALLS:  Life alert? No  Use of a cane, walker or w/c? No  Grab bars in the bathroom? Yes  Shower chair or bench in shower? Yes  Elevated toilet seat or a handicapped toilet? Yes   TIMED UP AND GO:  Was the test performed? No .  Length of time to ambulate 10 feet: N/A sec.     Cognitive Function:     6CIT Screen 03/29/2021  What Year? 0 points  What month? 0 points  What time? 0 points  Count back from 20 0 points  Months in reverse 0 points  Repeat phrase 10 points  Total Score 10    Immunizations Immunization History  Administered Date(s) Administered   Influenza,inj,Quad PF,6+ Mos 04/08/2015, 04/01/2017, 04/08/2019, 03/23/2021   Influenza-Unspecified 04/14/2014, 04/12/2016, 04/22/2018   Moderna Sars-Covid-2 Vaccination 10/22/2019, 11/21/2019, 06/24/2020   Tdap 05/09/2018    TDAP status: Up to date  Flu Vaccine status: Up to date  Pneumococcal vaccine status: Due, Education has been provided regarding the importance of this vaccine. Advised may receive this vaccine at local pharmacy or Health Dept. Aware to provide a copy of the vaccination record if obtained from local pharmacy or Health Dept. Verbalized acceptance and understanding.  Covid-19 vaccine status: Declined, Education has been provided regarding the importance of this vaccine but patient still declined. Advised may receive this vaccine at local pharmacy or Health Dept.or vaccine clinic. Aware to provide a copy of the vaccination record if obtained from local pharmacy or Health Dept. Verbalized acceptance and understanding.  Qualifies for Shingles Vaccine? Yes   Zostavax completed No   Shingrix Completed?: No.    Education has been provided regarding the importance of this vaccine. Patient has been advised to call insurance company to determine out of pocket expense if they have not yet received this vaccine. Advised may also receive vaccine at local pharmacy or Health Dept. Verbalized acceptance  and understanding.  Screening Tests Health Maintenance  Topic Date Due   HEMOGLOBIN A1C  01/31/2021   Zoster Vaccines- Shingrix (1 of 2) 05/13/2021 (Originally 08/03/1986)   OPHTHALMOLOGY EXAM  07/22/2021 (Originally 07/02/2018)   Pneumococcal Vaccine 80-72 Years old (1 - PCV) 02/10/2022 (Originally 08/03/1973)   FOOT EXAM  02/10/2022 (Originally 04/01/2018)   PNEUMOCOCCAL POLYSACCHARIDE VACCINE AGE 7-64 HIGH RISK  02/10/2022 (Originally 08/03/1969)   Hepatitis C Screening  02/10/2022 (Originally 08/03/1985)   HIV Screening  02/10/2022 (Originally 08/03/1982)   COLONOSCOPY (Pts 45-61yrs Insurance coverage will need to be confirmed)  03/19/2028   TETANUS/TDAP  05/09/2028   INFLUENZA VACCINE  Completed   HPV VACCINES  Aged Out   COVID-19 Vaccine  Discontinued    Health Maintenance  Health Maintenance Due  Topic Date Due   HEMOGLOBIN A1C  01/31/2021    Colorectal cancer screening: Type of screening: Colonoscopy. Completed 03/19/2018. Repeat every 10 years  Lung Cancer Screening: (Low Dose CT Chest recommended if Age 65-80 years, 30 pack-year currently smoking OR have quit w/in 15years.) does not qualify.   Lung Cancer Screening Referral: NO  Additional Screening:  Hepatitis C Screening: does qualify; Not Completed   Vision Screening: Recommended annual ophthalmology exams for early detection of glaucoma and other disorders of the eye. Is the patient up to date with their annual eye exam?  Yes  Who is the provider or what is the  name of the office in which the patient attends annual eye exams? [facility]  If pt is not established with a provider, would they like to be referred to a provider to establish care? No .   Dental Screening: Recommended annual dental exams for proper oral hygiene  Community Resource Referral / Chronic Care Management: CRR required this visit?  No   CCM required this visit?  No      Plan:     I have personally reviewed and noted the  following in the patient's chart:   Medical and social history Use of alcohol, tobacco or illicit drugs  Current medications and supplements including opioid prescriptions. Patient is currently taking opioid prescriptions. Information provided to patient regarding non-opioid alternatives. Patient advised to discuss non-opioid treatment plan with their provider. Functional ability and status Nutritional status Physical activity Advanced directives List of other physicians Hospitalizations, surgeries, and ER visits in previous 12 months Vitals Screenings to include cognitive, depression, and falls Referrals and appointments  In addition, I have reviewed and discussed with patient certain preventive protocols, quality metrics, and best practice recommendations. A written personalized care plan for preventive services as well as general preventive health recommendations were provided to patient.     Edgar Frisk, [facility]   03/29/2021   Nurse Notes: Non Face to Face 30 minute visit Encounter    Craig Robertson , Thank you for taking time to come for your Medicare Wellness Visit. I appreciate your ongoing commitment to your health goals. Please review the following plan we discussed and let me know if I can assist you in the future.   These are the goals we discussed:  Goals   None     This is a list of the screening recommended for you and due dates:  Health Maintenance  Topic Date Due   Hemoglobin A1C  01/31/2021   Zoster (Shingles) Vaccine (1 of 2) 05/13/2021*   Eye exam for diabetics  07/22/2021*   Pneumococcal Vaccination (1 - PCV) 02/10/2022*   Complete foot exam   02/10/2022*   Pneumococcal vaccine  02/10/2022*   Hepatitis C Screening: USPSTF Recommendation to screen - Ages 18-79 yo.  02/10/2022*   HIV Screening  02/10/2022*   Colon Cancer Screening  03/19/2028   Tetanus Vaccine  05/09/2028   Flu Shot  Completed   HPV Vaccine  Aged Out   COVID-19 Vaccine  Discontinued   *Topic was postponed. The date shown is not the original due date.

## 2021-04-10 ENCOUNTER — Other Ambulatory Visit: Payer: Self-pay | Admitting: Internal Medicine

## 2021-04-10 DIAGNOSIS — E119 Type 2 diabetes mellitus without complications: Secondary | ICD-10-CM

## 2021-04-10 MED ORDER — METFORMIN HCL 500 MG PO TABS: 500.0000 mg | ORAL_TABLET | Freq: Two times a day (BID) | ORAL | 1 refills | Status: DC

## 2021-04-11 ENCOUNTER — Other Ambulatory Visit: Payer: Self-pay | Admitting: Family Medicine

## 2021-04-11 ENCOUNTER — Telehealth: Payer: Self-pay

## 2021-04-11 ENCOUNTER — Other Ambulatory Visit: Payer: Self-pay | Admitting: *Deleted

## 2021-04-11 MED ORDER — PANTOPRAZOLE SODIUM 40 MG PO TBEC: 40.0000 mg | DELAYED_RELEASE_TABLET | Freq: Two times a day (BID) | ORAL | 0 refills | Status: DC

## 2021-04-11 NOTE — Telephone Encounter (Signed)
Patient called need med refill no longer gets from other pcp.   pantoprazole (PROTONIX) 40 MG tablet   Pharmacy  [facility] - [place], [place] - [address]  [address] [place] [address]  Phone:  [phone]  Fax:  [phone]

## 2021-04-11 NOTE — Telephone Encounter (Signed)
Pt medication sent to pharmacy  

## 2021-04-16 ENCOUNTER — Other Ambulatory Visit: Payer: Self-pay | Admitting: Family Medicine

## 2021-04-19 ENCOUNTER — Other Ambulatory Visit: Payer: Self-pay | Admitting: *Deleted

## 2021-04-19 ENCOUNTER — Telehealth: Payer: Self-pay

## 2021-04-19 DIAGNOSIS — Z029 Encounter for administrative examinations, unspecified: Secondary | ICD-10-CM

## 2021-04-19 MED ORDER — GLIPIZIDE 5 MG PO TABS: 5.0000 mg | ORAL_TABLET | Freq: Two times a day (BID) | ORAL | 1 refills | Status: DC

## 2021-04-19 NOTE — Telephone Encounter (Signed)
Patient called need med refill, no longer gets from other provider.  glipiZIDE (GLUCOTROL) 5 MG tablet   Pharmacy  [facility] [phone] [place], [place] - [address] [facility] [address]  [address] [place] [address]  Phone:  [phone]  Fax:  [phone]

## 2021-04-19 NOTE — Telephone Encounter (Signed)
Pt medication sent to pharmacy  

## 2021-05-11 ENCOUNTER — Other Ambulatory Visit: Payer: Self-pay | Admitting: Internal Medicine

## 2021-05-12 ENCOUNTER — Telehealth: Payer: Self-pay | Admitting: *Deleted

## 2021-05-12 NOTE — Telephone Encounter (Signed)
LVM to see if patient has had eye exam and where. If not, can schedule for DRS 10/27 

## 2021-05-14 ENCOUNTER — Other Ambulatory Visit: Payer: Self-pay | Admitting: Family Medicine

## 2021-05-15 NOTE — Telephone Encounter (Signed)
Requested Eye exam

## 2021-05-15 NOTE — Telephone Encounter (Signed)
Patient called lvm had his eye appointment with [facility] in [place]

## 2021-05-19 ENCOUNTER — Telehealth: Payer: Self-pay | Admitting: Internal Medicine

## 2021-05-19 NOTE — Telephone Encounter (Signed)
Pt needs ASTROVASTIN refills  [facility] [address] In [place], [place]

## 2021-05-22 ENCOUNTER — Other Ambulatory Visit: Payer: Self-pay

## 2021-05-22 MED ORDER — ATORVASTATIN CALCIUM 40 MG PO TABS: 40.0000 mg | ORAL_TABLET | Freq: Every day | ORAL | 1 refills | Status: DC

## 2021-05-22 NOTE — Telephone Encounter (Signed)
Refill sent.

## 2021-05-22 NOTE — Telephone Encounter (Signed)
PT states that he has not heard from the pharmacy regarding his medication --please call them

## 2021-05-24 ENCOUNTER — Encounter: Payer: Self-pay | Admitting: *Deleted

## 2021-05-29 ENCOUNTER — Telehealth: Payer: Self-pay | Admitting: Internal Medicine

## 2021-05-29 ENCOUNTER — Other Ambulatory Visit: Payer: Self-pay | Admitting: *Deleted

## 2021-05-29 MED ORDER — VERAPAMIL HCL ER 180 MG PO CP24: 180.0000 mg | ORAL_CAPSULE | Freq: Two times a day (BID) | ORAL | 0 refills | Status: DC

## 2021-05-29 NOTE — Telephone Encounter (Signed)
Rx was sent into pharmacy.  

## 2021-05-29 NOTE — Telephone Encounter (Signed)
Please send  Vamperil to  [facility] in [place]

## 2021-05-30 ENCOUNTER — Telehealth: Payer: Self-pay | Admitting: Internal Medicine

## 2021-05-30 ENCOUNTER — Other Ambulatory Visit: Payer: Self-pay | Admitting: Internal Medicine

## 2021-05-30 DIAGNOSIS — I10 Essential (primary) hypertension: Secondary | ICD-10-CM

## 2021-05-30 MED ORDER — VERAPAMIL HCL ER 180 MG PO TBCR: 180.0000 mg | EXTENDED_RELEASE_TABLET | Freq: Two times a day (BID) | ORAL | 1 refills | Status: DC

## 2021-05-30 NOTE — Telephone Encounter (Signed)
Pt called in about med VERAPAMIL Pt would like meds re sent  to pharm in TABLET form instead of capsule form. The Tablet is cheaper w/ insurance.   [facility] in [place] , [place]

## 2021-06-07 ENCOUNTER — Other Ambulatory Visit: Payer: Self-pay | Admitting: *Deleted

## 2021-06-07 DIAGNOSIS — I10 Essential (primary) hypertension: Secondary | ICD-10-CM

## 2021-06-07 DIAGNOSIS — E119 Type 2 diabetes mellitus without complications: Secondary | ICD-10-CM

## 2021-06-07 MED ORDER — METFORMIN HCL 500 MG PO TABS: 500.0000 mg | ORAL_TABLET | Freq: Two times a day (BID) | ORAL | 1 refills | Status: DC

## 2021-06-07 MED ORDER — GLIPIZIDE 5 MG PO TABS: ORAL_TABLET | ORAL | 1 refills | Status: DC

## 2021-06-07 MED ORDER — VALSARTAN 160 MG PO TABS: 160.0000 mg | ORAL_TABLET | Freq: Every day | ORAL | 2 refills | Status: DC

## 2021-06-07 MED ORDER — ATORVASTATIN CALCIUM 40 MG PO TABS: 40.0000 mg | ORAL_TABLET | Freq: Every day | ORAL | 1 refills | Status: DC

## 2021-06-07 MED ORDER — PANTOPRAZOLE SODIUM 40 MG PO TBEC: 40.0000 mg | DELAYED_RELEASE_TABLET | Freq: Two times a day (BID) | ORAL | 0 refills | Status: DC

## 2021-06-10 ENCOUNTER — Other Ambulatory Visit: Payer: Self-pay | Admitting: Internal Medicine

## 2021-06-17 ENCOUNTER — Other Ambulatory Visit: Payer: Self-pay | Admitting: Family Medicine

## 2021-06-17 DIAGNOSIS — I10 Essential (primary) hypertension: Secondary | ICD-10-CM

## 2021-06-18 DIAGNOSIS — M25552 Pain in left hip: Secondary | ICD-10-CM | POA: Insufficient documentation

## 2021-06-19 ENCOUNTER — Other Ambulatory Visit: Payer: Self-pay

## 2021-06-19 ENCOUNTER — Other Ambulatory Visit: Payer: Self-pay | Admitting: Internal Medicine

## 2021-06-19 ENCOUNTER — Telehealth: Payer: Self-pay | Admitting: Internal Medicine

## 2021-06-19 DIAGNOSIS — E119 Type 2 diabetes mellitus without complications: Secondary | ICD-10-CM

## 2021-06-19 MED ORDER — BLOOD GLUCOSE MONITOR KIT: PACK | 0 refills | Status: DC

## 2021-06-19 NOTE — Telephone Encounter (Signed)
Pt needs blood glucose machine sent in

## 2021-06-19 NOTE — Telephone Encounter (Signed)
Refills sent

## 2021-06-23 ENCOUNTER — Other Ambulatory Visit: Payer: Self-pay | Admitting: *Deleted

## 2021-06-23 DIAGNOSIS — E119 Type 2 diabetes mellitus without complications: Secondary | ICD-10-CM

## 2021-06-23 LAB — CMP14+EGFR
ALT: 33 IU/L (ref 0–44)
AST: 22 IU/L (ref 0–40)
Albumin/Globulin Ratio: 1.6 (ref 1.2–2.2)
Albumin: 4.4 g/dL (ref 3.8–4.9)
Alkaline Phosphatase: 87 IU/L (ref 44–121)
BUN/Creatinine Ratio: 20 (ref 9–20)
BUN: 23 mg/dL (ref 6–24)
Bilirubin Total: 0.3 mg/dL (ref 0.0–1.2)
CO2: 21 mmol/L (ref 20–29)
Calcium: 9.4 mg/dL (ref 8.7–10.2)
Chloride: 106 mmol/L (ref 96–106)
Creatinine, Ser: 1.13 mg/dL (ref 0.76–1.27)
Globulin, Total: 2.7 g/dL (ref 1.5–4.5)
Glucose: 75 mg/dL (ref 70–99)
Potassium: 4.4 mmol/L (ref 3.5–5.2)
Sodium: 143 mmol/L (ref 134–144)
Total Protein: 7.1 g/dL (ref 6.0–8.5)
eGFR: 78 mL/min/{1.73_m2} (ref >59)

## 2021-06-23 LAB — CBC WITH DIFFERENTIAL/PLATELET
Basophils Absolute: 0.1 10*3/uL (ref 0.0–0.2)
Basos: 1 %
EOS (ABSOLUTE): 0.2 10*3/uL (ref 0.0–0.4)
Eos: 3 %
Hematocrit: 41.9 % (ref 37.5–51.0)
Hemoglobin: 13.5 g/dL (ref 13.0–17.7)
Immature Grans (Abs): 0 10*3/uL (ref 0.0–0.1)
Immature Granulocytes: 0 %
Lymphocytes Absolute: 3.1 10*3/uL (ref 0.7–3.1)
Lymphs: 37 %
MCH: 28.2 pg (ref 26.6–33.0)
MCHC: 32.2 g/dL (ref 31.5–35.7)
MCV: 88 fL (ref 79–97)
Monocytes Absolute: 1 10*3/uL — ABNORMAL HIGH (ref 0.1–0.9)
Monocytes: 12 %
Neutrophils Absolute: 4 10*3/uL (ref 1.4–7.0)
Neutrophils: 47 %
Platelets: 306 10*3/uL (ref 150–450)
RBC: 4.79 x10E6/uL (ref 4.14–5.80)
RDW: 11.9 % (ref 11.6–15.4)
WBC: 8.3 10*3/uL (ref 3.4–10.8)

## 2021-06-23 LAB — LIPID PANEL
Chol/HDL Ratio: 2.5 ratio (ref 0.0–5.0)
Cholesterol, Total: 137 mg/dL (ref 100–199)
HDL: 55 mg/dL (ref >39)
LDL Chol Calc (NIH): 70 mg/dL (ref 0–99)
Triglycerides: 55 mg/dL (ref 0–149)
VLDL Cholesterol Cal: 12 mg/dL (ref 5–40)

## 2021-06-23 LAB — HEMOGLOBIN A1C
Est. average glucose Bld gHb Est-mCnc: 123 mg/dL
Hgb A1c MFr Bld: 5.9 % — ABNORMAL HIGH (ref 4.8–5.6)

## 2021-06-23 LAB — VITAMIN D 25 HYDROXY (VIT D DEFICIENCY, FRACTURES): Vit D, 25-Hydroxy: 35.1 ng/mL (ref 30.0–100.0)

## 2021-06-23 LAB — TSH: TSH: 5.2 u[IU]/mL — ABNORMAL HIGH (ref 0.450–4.500)

## 2021-06-23 MED ORDER — PANTOPRAZOLE SODIUM 40 MG PO TBEC
40.0000 mg | DELAYED_RELEASE_TABLET | Freq: Two times a day (BID) | ORAL | 0 refills | Status: DC
Start: 2021-06-23 — End: 2021-09-07

## 2021-06-23 MED ORDER — EMPAGLIFLOZIN 25 MG PO TABS: 25.0000 mg | ORAL_TABLET | Freq: Every day | ORAL | 0 refills | Status: DC

## 2021-06-26 ENCOUNTER — Encounter: Payer: Self-pay | Admitting: Internal Medicine

## 2021-06-26 ENCOUNTER — Other Ambulatory Visit: Payer: Self-pay

## 2021-06-26 ENCOUNTER — Ambulatory Visit (INDEPENDENT_AMBULATORY_CARE_PROVIDER_SITE_OTHER): Payer: Medicare PPO | Admitting: Internal Medicine

## 2021-06-26 VITALS — BP 144/86 | HR 69 | Resp 18 | Ht 71.5 in | Wt 250.0 lb

## 2021-06-26 DIAGNOSIS — Z23 Encounter for immunization: Secondary | ICD-10-CM

## 2021-06-26 DIAGNOSIS — I10 Essential (primary) hypertension: Secondary | ICD-10-CM | POA: Diagnosis not present

## 2021-06-26 DIAGNOSIS — E782 Mixed hyperlipidemia: Secondary | ICD-10-CM | POA: Diagnosis not present

## 2021-06-26 DIAGNOSIS — Z0001 Encounter for general adult medical examination with abnormal findings: Secondary | ICD-10-CM | POA: Diagnosis not present

## 2021-06-26 DIAGNOSIS — E038 Other specified hypothyroidism: Secondary | ICD-10-CM

## 2021-06-26 DIAGNOSIS — E1169 Type 2 diabetes mellitus with other specified complication: Secondary | ICD-10-CM | POA: Diagnosis not present

## 2021-06-26 DIAGNOSIS — M67471 Ganglion, right ankle and foot: Secondary | ICD-10-CM

## 2021-06-26 MED ORDER — AMLODIPINE BESYLATE 10 MG PO TABS: 10.0000 mg | ORAL_TABLET | Freq: Every day | ORAL | 1 refills | Status: DC

## 2021-06-26 MED ORDER — BLOOD GLUCOSE MONITOR KIT: PACK | 0 refills | Status: AC

## 2021-06-26 NOTE — Assessment & Plan Note (Signed)
Lab Results  Component Value Date   HGBA1C 5.9 (H) 06/22/2021   On Metformin 500 mg BID, Glipizide 5 mg BID and Jardiance 10 mg QD Blood glucose around 80s at home DC Glipizide Advised to follow diabetic diet On statin and ARB Reviewed CMP and lipid panel Diabetic eye exam: Advised to follow up with Ophthalmology for diabetic eye exam

## 2021-06-26 NOTE — Assessment & Plan Note (Signed)
New onset Cyst like structure, usually benign If persistent, will refer to Podiatry

## 2021-06-26 NOTE — Assessment & Plan Note (Signed)
BP Readings from Last 1 Encounters:  06/26/21 (!) 144/86   Uncontrolled with Valsartan and Verapamil Switched to Amlodipine from Verapamil Counseled for compliance with the medications Advised DASH diet and moderate exercise/walking, at least 150 mins/week

## 2021-06-26 NOTE — Patient Instructions (Signed)
Please stop taking Glipizide.  Please start taking Amlodipine instead of Verapamil.  Please get fasting blood tests done before the next visit.

## 2021-06-26 NOTE — Progress Notes (Signed)
 Established Patient Office Visit  Subjective:  Patient ID: Craig Robertson, male    DOB: 07/01/1966  Age: 55 y.o. MRN: [id number]  CC:  Chief Complaint  Patient presents with   Annual Exam    Annual exam     HPI Craig Robertson is a 55 y.o. male with past medical history of HTN, CVA with residual right sided weakness, type 2 DM, OSA, GERD, lumbar radiculopathy and morbid obesity who presents for annual physical.  DM: His HbA1C is 5.9 now. He has been taking Metformin, Glipizide and Jardiance regularly. His blood glucose at home are well-controlled mostly, but has blood glucose of around 80s sometimes and feels tired and light headache at times. Denies any polyuria or polyphagia currently.  HTN: BP is elevated today. Takes medications regularly. Patient denies headache, dizziness, chest pain, dyspnea or palpitations.  He is willing to switch to amlodipine from verapamil.  OSA: Uses CPAP regularly.  He complains of having a bump over his right foot, which is slightly painful currently.  Denies any recent injury.  Blood tests were reviewed and discussed with the patient in detail.  He received PCV 20 in the office today.   Past Medical History:  Diagnosis Date   Arthritis    Complication of anesthesia    "slow to wake"   Diabetes mellitus without complication (HCC)    Dyspnea    allegy season    ED (erectile dysfunction)    GERD (gastroesophageal reflux disease)    Hyperlipidemia    Hypertension    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 12/08/2013   Low testosterone    Sleep apnea    on CPAP    Stroke (HCC) 2015   TIA     Past Surgical History:  Procedure Laterality Date   BACK SURGERY     INSERTION OF MESH Left 06/14/2020   Procedure: INSERTION OF MESH;  Surgeon: Ramirez, Armando, MD;  Location: [facility];  Service: General;  Laterality: Left;   LUMBAR DISC SURGERY  2015   lumbar surgery for bulging disc at surgery center    tissue reduction     in chest when he was  55 years old   TOTAL HIP ARTHROPLASTY Left 03/05/2017   Procedure: LEFT TOTAL HIP ARTHROPLASTY ANTERIOR APPROACH;  Surgeon: Olin, Matthew, MD;  Location: [facility];  Service: Orthopedics;  Laterality: Left;  70 mins   XI ROBOTIC ASSISTED INGUINAL HERNIA REPAIR WITH MESH Left 06/14/2020   Procedure: XI ROBOTIC ASSISTED LEFT INGUINAL HERNIA REPAIR WITH MESH;  Surgeon: Ramirez, Armando, MD;  Location: [facility];  Service: General;  Laterality: Left;    Family History  Problem Relation Age of Onset   Diabetes Cousin    Diabetes Mother    Hypertension Father    Leukemia Father    Hypertension Brother    Hypertension Sister    Diabetes Paternal Aunt    Diabetes Paternal Uncle    Hypertension Brother    Hypertension Brother    Hypertension Brother    Hypertension Sister     Social History   Socioeconomic History   Marital status: Married    Spouse name: Not on file   Number of children: 4   Years of education: Not on file   Highest education level: Not on file  Occupational History   Occupation: tobacco industry  Tobacco Use   Smoking status: Never   Smokeless tobacco: Never  Vaping Use   Vaping Use: Never used  Substance and   Sexual Activity   Alcohol use: No    Alcohol/week: 0.0 standard drinks   Drug use: No   Sexual activity: Not on file  Other Topics Concern   Not on file  Social History Narrative   Not on file   Social Determinants of Health   Financial Resource Strain: Low Risk    Difficulty of Paying Living Expenses: Not hard at all  Food Insecurity: No Food Insecurity   Worried About Charity fundraiser in the Last Year: Never true   [place] in the Last Year: Never true  Transportation Needs: No Transportation Needs   Lack of Transportation (Medical): No   Lack of Transportation (Non-Medical): No  Physical Activity: Insufficiently Active   Days of Exercise per Week: 7 days   Minutes of Exercise per Session: 10 min  Stress: No Stress Concern Present    Feeling of Stress : Not at all  Social Connections: Moderately Integrated   Frequency of Communication with Friends and Family: More than three times a week   Frequency of Social Gatherings with Friends and Family: Twice a week   Attends Religious Services: More than 4 times per year   Active Member of [employer] or Organizations: No   Attends Music therapist: Never   Marital Status: Married  Human resources officer Violence: Not At Risk   Fear of Current or Ex-Partner: No   Emotionally Abused: No   Physically Abused: No   Sexually Abused: No    Outpatient Medications Prior to Visit  Medication Sig Dispense Refill   albuterol (VENTOLIN HFA) 108 (90 Base) MCG/ACT inhaler Inhale 2 puffs into the lungs every 6 (six) hours as needed for wheezing or shortness of breath. 18 g 1   aspirin EC 81 MG tablet Take 81 mg by mouth daily. Swallow whole.     atorvastatin (LIPITOR) 40 MG tablet Take 1 tablet (40 mg total) by mouth daily. 90 tablet 1   empagliflozin (JARDIANCE) 25 MG TABS tablet Take 1 tablet (25 mg total) by mouth daily before breakfast. 90 tablet 0   famotidine (PEPCID) 20 MG tablet One after supper (Patient taking differently: Take 20 mg by mouth daily with supper.) 30 tablet 11   Lancets (ONETOUCH ULTRASOFT) lancets Use as instructed 100 each 12   metFORMIN (GLUCOPHAGE) 500 MG tablet Take 1 tablet (500 mg total) by mouth 2 (two) times daily with a meal. 180 tablet 1   Multiple Vitamin (MULTIVITAMIN) tablet Take 1 tablet by mouth daily.     ONETOUCH VERIO test strip TEST SUGAR DAILY 50 each 5   pantoprazole (PROTONIX) 40 MG tablet Take 1 tablet (40 mg total) by mouth 2 (two) times daily before a meal. 180 tablet 0   tadalafil (CIALIS) 5 MG tablet TAKE 1 TABLET BY MOUTH DAILY AS NEEDED FOR ERECTILE DYSFUNCTION (Patient taking differently: Take 5 mg by mouth daily as needed. TAKE 1 TABLET BY MOUTH DAILY AS NEEDED FOR ERECTILE DYSFUNCTION) 30 tablet 2   valsartan (DIOVAN) 160 MG tablet  Take 1 tablet (160 mg total) by mouth daily. 30 tablet 2   blood glucose meter kit and supplies KIT Dispense based on patient and insurance preference. Use up to test glucose once daily. ICD 10 code E11.9. 1 each 0   glipiZIDE (GLUCOTROL) 5 MG tablet TAKE 1 TABLET BY MOUTH TWICE A DAY BEFORE MEAL 60 tablet 1   verapamil (CALAN-SR) 180 MG CR tablet Take 1 tablet (180 mg total) by mouth 2 (  two) times daily. 180 tablet 1   No facility-administered medications prior to visit.    Allergies  Allergen Reactions   Codeine Nausea And Vomiting    ROS Review of Systems  Constitutional:  Negative for chills and fever.  HENT:  Negative for congestion and sore throat.   Eyes:  Negative for pain and discharge.  Respiratory:  Negative for cough and shortness of breath.   Cardiovascular:  Negative for chest pain and palpitations.  Gastrointestinal:  Negative for constipation, diarrhea, nausea and vomiting.  Endocrine: Negative for polydipsia and polyuria.  Genitourinary:  Negative for dysuria and hematuria.  Musculoskeletal:  Negative for neck pain and neck stiffness.  Skin:  Negative for rash.  Neurological:  Negative for dizziness, weakness, numbness and headaches.  Psychiatric/Behavioral:  Negative for agitation and behavioral problems.      Objective:    Physical Exam Vitals reviewed.  Constitutional:      General: He is not in acute distress.    Appearance: He is obese. He is not diaphoretic.  HENT:     Head: Normocephalic and atraumatic.     Nose: Nose normal.     Mouth/Throat:     Mouth: Mucous membranes are moist.  Eyes:     General: No scleral icterus.    Extraocular Movements: Extraocular movements intact.  Cardiovascular:     Rate and Rhythm: Normal rate and regular rhythm.     Pulses: Normal pulses.     Heart sounds: Normal heart sounds. No murmur heard. Pulmonary:     Breath sounds: Normal breath sounds. No wheezing or rales.  Abdominal:     Palpations: Abdomen is soft.      Tenderness: There is no abdominal tenderness.  Musculoskeletal:     Cervical back: Neck supple. No tenderness.     Right lower leg: No edema.     Left lower leg: No edema.  Skin:    General: Skin is warm.     Findings: No rash.     Comments: Cyst like structure over right foot, dorsal aspect, over 4th and 5th metatarsal area  Neurological:     General: No focal deficit present.     Mental Status: He is alert and oriented to person, place, and time.     Sensory: No sensory deficit.     Motor: Weakness (4+ in right UE and LE, 5+ in left UE) present.  Psychiatric:        Mood and Affect: Mood normal.        Behavior: Behavior normal.    BP (!) 144/86 (BP Location: Left Arm, Patient Position: Sitting, Cuff Size: Normal)   Pulse 69   Resp 18   Ht 5' 11.5" (1.816 m)   Wt 250 lb (113.4 kg)   SpO2 98%   BMI 34.38 kg/m  Wt Readings from Last 3 Encounters:  06/26/21 250 lb (113.4 kg)  03/23/21 261 lb 1.3 oz (118.4 kg)  02/23/21 246 lb 1.6 oz (111.6 kg)    Lab Results  Component Value Date   TSH 5.200 (H) 06/22/2021   Lab Results  Component Value Date   WBC 8.3 06/22/2021   HGB 13.5 06/22/2021   HCT 41.9 06/22/2021   MCV 88 06/22/2021   PLT 306 06/22/2021   Lab Results  Component Value Date   NA 143 06/22/2021   K 4.4 06/22/2021   CO2 21 06/22/2021   GLUCOSE 75 06/22/2021   BUN 23 06/22/2021   CREATININE 1.13 06/22/2021     BILITOT 0.3 06/22/2021   ALKPHOS 87 06/22/2021   AST 22 06/22/2021   ALT 33 06/22/2021   PROT 7.1 06/22/2021   ALBUMIN 4.4 06/22/2021   CALCIUM 9.4 06/22/2021   ANIONGAP 10 02/19/2021   EGFR 78 06/22/2021   Lab Results  Component Value Date   CHOL 137 06/22/2021   Lab Results  Component Value Date   HDL 55 06/22/2021   Lab Results  Component Value Date   LDLCALC 70 06/22/2021   Lab Results  Component Value Date   TRIG 55 06/22/2021   Lab Results  Component Value Date   CHOLHDL 2.5 06/22/2021   Lab Results  Component Value  Date   HGBA1C 5.9 (H) 06/22/2021      Assessment & Plan:   Problem List Items Addressed This Visit       Encounter for general adult medical examination with abnormal findings - Primary   Physical exam as documented. Counseling done  re healthy lifestyle involving commitment to 150 minutes exercise per week, heart healthy diet, and attaining healthy weight.The importance of adequate sleep also discussed. Changes in health habits are decided on by the patient with goals and time frames  set for achieving them. Immunization and cancer screening needs are specifically addressed at this visit.       Cardiovascular and Mediastinum   Essential hypertension, benign (Chronic)    BP Readings from Last 1 Encounters:  06/26/21 (!) 144/86  Uncontrolled with Valsartan and Verapamil Switched to Amlodipine from Verapamil Counseled for compliance with the medications Advised DASH diet and moderate exercise/walking, at least 150 mins/week      Relevant Medications   amLODipine (NORVASC) 10 MG tablet     Endocrine   Type 2 diabetes mellitus (HCC)    Lab Results  Component Value Date   HGBA1C 5.9 (H) 06/22/2021  On Metformin 500 mg BID, Glipizide 5 mg BID and Jardiance 10 mg QD Blood glucose around 80s at home DC Glipizide Advised to follow diabetic diet On statin and ARB Reviewed CMP and lipid panel Diabetic eye exam: Advised to follow up with Ophthalmology for diabetic eye exam       Relevant Medications   blood glucose meter kit and supplies KIT   Other Relevant Orders   Hemoglobin A1c   Subclinical hypothyroidism    Lab Results  Component Value Date   TSH 5.200 (H) 06/22/2021  Check TSH and free T4      Relevant Orders   TSH + free T4     Other   Hyperlipidemia   Relevant Medications   amLODipine (NORVASC) 10 MG tablet   Ganglion cyst of right foot    New onset Cyst like structure, usually benign If persistent, will refer to Podiatry            Other Visit  Diagnoses     Need for viral immunization       Relevant Orders   Pneumococcal conjugate vaccine 20-valent (Prevnar 20) (Completed)       Meds ordered this encounter  Medications   amLODipine (NORVASC) 10 MG tablet    Sig: Take 1 tablet (10 mg total) by mouth daily.    Dispense:  90 tablet    Refill:  1    Discontinue Verapamil   blood glucose meter kit and supplies KIT    Sig: Dispense based on patient and insurance preference. Use up to test glucose once daily. ICD 10 code E11.9.    Dispense:  1 each  Refill:  0    Order Specific Question:   Number of strips    Answer:   50    Order Specific Question:   Number of lancets    Answer:   50    Follow-up: Return in about 3 months (around 09/24/2021) for HTN, DM and TSH review..    Rutwik K Patel, MD  

## 2021-06-26 NOTE — Assessment & Plan Note (Signed)

## 2021-06-26 NOTE — Assessment & Plan Note (Signed)
Lab Results  Component Value Date   TSH 5.200 (H) 06/22/2021   Check TSH and free T4

## 2021-09-06 ENCOUNTER — Other Ambulatory Visit: Payer: Self-pay | Admitting: Internal Medicine

## 2021-09-06 DIAGNOSIS — I10 Essential (primary) hypertension: Secondary | ICD-10-CM

## 2021-09-07 ENCOUNTER — Other Ambulatory Visit: Payer: Self-pay | Admitting: Internal Medicine

## 2021-09-07 DIAGNOSIS — E119 Type 2 diabetes mellitus without complications: Secondary | ICD-10-CM

## 2021-09-07 DIAGNOSIS — G4731 Primary central sleep apnea: Secondary | ICD-10-CM | POA: Diagnosis not present

## 2021-09-20 DIAGNOSIS — E038 Other specified hypothyroidism: Secondary | ICD-10-CM | POA: Diagnosis not present

## 2021-09-20 DIAGNOSIS — E1169 Type 2 diabetes mellitus with other specified complication: Secondary | ICD-10-CM | POA: Diagnosis not present

## 2021-09-21 LAB — HEMOGLOBIN A1C
Est. average glucose Bld gHb Est-mCnc: 146 mg/dL
Hgb A1c MFr Bld: 6.7 % — ABNORMAL HIGH (ref 4.8–5.6)

## 2021-09-21 LAB — TSH+FREE T4
Free T4: 0.99 ng/dL (ref 0.82–1.77)
TSH: 3.94 u[IU]/mL (ref 0.450–4.500)

## 2021-09-25 ENCOUNTER — Ambulatory Visit: Payer: Medicare PPO | Admitting: Internal Medicine

## 2021-10-05 DIAGNOSIS — G4731 Primary central sleep apnea: Secondary | ICD-10-CM | POA: Diagnosis not present

## 2021-10-09 ENCOUNTER — Other Ambulatory Visit: Payer: Self-pay

## 2021-10-09 ENCOUNTER — Ambulatory Visit (INDEPENDENT_AMBULATORY_CARE_PROVIDER_SITE_OTHER): Payer: Medicare PPO | Admitting: Internal Medicine

## 2021-10-09 ENCOUNTER — Encounter: Payer: Self-pay | Admitting: Internal Medicine

## 2021-10-09 VITALS — BP 118/82 | HR 68 | Resp 18 | Ht 71.0 in | Wt 254.2 lb

## 2021-10-09 DIAGNOSIS — N5201 Erectile dysfunction due to arterial insufficiency: Secondary | ICD-10-CM

## 2021-10-09 DIAGNOSIS — E038 Other specified hypothyroidism: Secondary | ICD-10-CM

## 2021-10-09 DIAGNOSIS — I10 Essential (primary) hypertension: Secondary | ICD-10-CM | POA: Diagnosis not present

## 2021-10-09 DIAGNOSIS — E1169 Type 2 diabetes mellitus with other specified complication: Secondary | ICD-10-CM

## 2021-10-09 DIAGNOSIS — G4733 Obstructive sleep apnea (adult) (pediatric): Secondary | ICD-10-CM

## 2021-10-09 DIAGNOSIS — I6930 Unspecified sequelae of cerebral infarction: Secondary | ICD-10-CM | POA: Diagnosis not present

## 2021-10-09 MED ORDER — TADALAFIL 10 MG PO TABS: 10.0000 mg | ORAL_TABLET | Freq: Every day | ORAL | 0 refills | Status: AC | PRN

## 2021-10-09 NOTE — Assessment & Plan Note (Signed)
Lab Results  ?Component Value Date  ? TSH 3.940 09/20/2021  ? ?Improved now ?

## 2021-10-09 NOTE — Patient Instructions (Signed)
Please continue taking medications as prescribed. ? ?Please continue to follow low carb diet and ambulate as tolerated. ? ?Please take at least 64 ounces of fluid in a day. ?

## 2021-10-09 NOTE — Progress Notes (Signed)
? ?Established Patient Office Visit ? ?Subjective:  ?Patient ID: Craig Robertson, male    DOB: 08-21-1965  Age: 56 y.o. MRN: [id number] ? ?CC:  ?Chief Complaint  ?Patient presents with  ? Follow-up  ?  3 month follow up pt has been feeling foggy for about 2 months went to check on a property ended up elsewhere  ? ? ?HPI ?Craig Robertson is a 56 y.o. male with past medical history of HTN, CVA with residual right sided weakness, type 2 DM, OSA, GERD, lumbar radiculopathy and morbid obesity who presents for f/u of his chronic medical conditions. ? ?He has been having foggy sensation at times for the last 2 months, but denies any chest pain or dyspnea currently.  Of note, he is glipizide was discontinued in the last visit.  There was concern for hypoglycemia.  His HbA1c has increased to 6.7 now from 5.9 earlier.  He denies any polyuria or polyphagia currently. ? ?HTN: BP is well-controlled. Takes medications regularly. Patient denies headache, chest pain, dyspnea or palpitations. ? ?OSA: Uses CPAP regularly. ? ?Past Medical History:  ?Diagnosis Date  ? Arthritis   ? Complication of anesthesia   ? "slow to wake"  ? Diabetes mellitus without complication ([place])   ? Dyspnea   ? allegy season   ? ED (erectile dysfunction)   ? GERD (gastroesophageal reflux disease)   ? Hyperlipidemia   ? Hypertension   ? 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 12/08/2013  ? Low testosterone   ? Sleep apnea   ? on CPAP   ? Stroke [facility]) 2015  ? TIA   ? ? ?Past Surgical History:  ?Procedure Laterality Date  ? BACK SURGERY    ? INSERTION OF MESH Left 06/14/2020  ? Procedure: INSERTION OF MESH;  Surgeon: Ralene Ok, MD;  Location: [place];  Service: General;  Laterality: Left;  ? [place] SURGERY  2015  ? lumbar surgery for bulging disc at surgery center   ? tissue reduction    ? in chest when he was 56 years old  ? TOTAL HIP ARTHROPLASTY Left 03/05/2017  ? Procedure: LEFT TOTAL HIP ARTHROPLASTY ANTERIOR APPROACH;  Surgeon: Paralee Cancel, MD;   Location: [facility];  Service: Orthopedics;  Laterality: Left;  70 mins  ? XI ROBOTIC ASSISTED INGUINAL HERNIA REPAIR WITH MESH Left 06/14/2020  ? Procedure: XI ROBOTIC ASSISTED LEFT INGUINAL HERNIA REPAIR WITH MESH;  Surgeon: Ralene Ok, MD;  Location: [place];  Service: General;  Laterality: Left;  ? ? ?Family History  ?Problem Relation Age of Onset  ? Diabetes Cousin   ? Diabetes Mother   ? Hypertension Father   ? Leukemia Father   ? Hypertension Brother   ? Hypertension Sister   ? Diabetes Paternal Aunt   ? Diabetes Paternal Uncle   ? Hypertension Brother   ? Hypertension Brother   ? Hypertension Brother   ? Hypertension Sister   ? ? ?Social History  ? ?Socioeconomic History  ? Marital status: Married  ?  Spouse name: Not on file  ? Number of children: 4  ? Years of education: Not on file  ? Highest education level: Not on file  ?Occupational History  ? Occupation: tobacco industry  ?Tobacco Use  ? Smoking status: Never  ? Smokeless tobacco: Never  ?Vaping Use  ? Vaping Use: Never used  ?Substance and Sexual Activity  ? Alcohol use: No  ?  Alcohol/week: 0.0 standard drinks  ? Drug use: No  ?  Sexual activity: Not on file  ?Other Topics Concern  ? Not on file  ?Social History Narrative  ? Not on file  ? ?Social Determinants of Health  ? ?Financial Resource Strain: Low Risk   ? Difficulty of Paying Living Expenses: Not hard at all  ?Food Insecurity: No Food Insecurity  ? Worried About Charity fundraiser in the Last Year: Never true  ? Ran Out of Food in the Last Year: Never true  ?Transportation Needs: No Transportation Needs  ? Lack of Transportation (Medical): No  ? Lack of Transportation (Non-Medical): No  ?Physical Activity: Insufficiently Active  ? Days of Exercise per Week: 7 days  ? Minutes of Exercise per Session: 10 min  ?Stress: No Stress Concern Present  ? Feeling of Stress : Not at all  ?Social Connections: Moderately Integrated  ? Frequency of Communication with Friends and Family: More than three  times a week  ? Frequency of Social Gatherings with Friends and Family: Twice a week  ? Attends Religious Services: More than 4 times per year  ? Active Member of Clubs or Organizations: No  ? Attends Archivist Meetings: Never  ? Marital Status: Married  ?Intimate Partner Violence: Not At Risk  ? Fear of Current or Ex-Partner: No  ? Emotionally Abused: No  ? Physically Abused: No  ? Sexually Abused: No  ? ? ?Outpatient Medications Prior to Visit  ?Medication Sig Dispense Refill  ? amLODipine (NORVASC) 10 MG tablet Take 1 tablet (10 mg total) by mouth daily. 90 tablet 1  ? aspirin EC 81 MG tablet Take 81 mg by mouth daily. Swallow whole.    ? atorvastatin (LIPITOR) 40 MG tablet Take 1 tablet (40 mg total) by mouth daily. 90 tablet 1  ? blood glucose meter kit and supplies KIT Dispense based on patient and insurance preference. Use up to test glucose once daily. ICD 10 code E11.9. 1 each 0  ? famotidine (PEPCID) 20 MG tablet One after supper (Patient taking differently: Take 20 mg by mouth daily with supper.) 30 tablet 11  ? JARDIANCE 25 MG TABS tablet TAKE 1 TABLET BY MOUTH EVERY DAY BEFORE BREAKFAST 30 tablet 5  ? Lancets (ONETOUCH ULTRASOFT) lancets Use as instructed 100 each 12  ? metFORMIN (GLUCOPHAGE) 500 MG tablet Take 1 tablet (500 mg total) by mouth 2 (two) times daily with a meal. 180 tablet 1  ? Multiple Vitamin (MULTIVITAMIN) tablet Take 1 tablet by mouth daily.    ? ONETOUCH VERIO test strip TEST SUGAR DAILY 50 each 5  ? pantoprazole (PROTONIX) 40 MG tablet TAKE 1 TABLET TWICE DAILY BEFORE MEALS 180 tablet 1  ? valsartan (DIOVAN) 160 MG tablet TAKE 1 TABLET EVERY DAY 90 tablet 1  ? albuterol (VENTOLIN HFA) 108 (90 Base) MCG/ACT inhaler Inhale 2 puffs into the lungs every 6 (six) hours as needed for wheezing or shortness of breath. 18 g 1  ? tadalafil (CIALIS) 5 MG tablet TAKE 1 TABLET BY MOUTH DAILY AS NEEDED FOR ERECTILE DYSFUNCTION (Patient taking differently: Take 5 mg by mouth daily as  needed. TAKE 1 TABLET BY MOUTH DAILY AS NEEDED FOR ERECTILE DYSFUNCTION) 30 tablet 2  ? ?No facility-administered medications prior to visit.  ? ? ?Allergies  ?Allergen Reactions  ? Codeine Nausea And Vomiting  ? ? ?ROS ?Review of Systems  ?Constitutional:  Negative for chills and fever.  ?HENT:  Negative for congestion and sore throat.   ?Eyes:  Negative for pain and discharge.  ?  Respiratory:  Negative for cough and shortness of breath.   ?Cardiovascular:  Negative for chest pain and palpitations.  ?Gastrointestinal:  Negative for constipation, diarrhea, nausea and vomiting.  ?Endocrine: Negative for polydipsia and polyuria.  ?Genitourinary:  Negative for dysuria and hematuria.  ?Musculoskeletal:  Negative for neck pain and neck stiffness.  ?Skin:  Negative for rash.  ?Neurological:  Negative for dizziness, weakness, numbness and headaches.  ?Psychiatric/Behavioral:  Negative for agitation and behavioral problems.   ? ?  ?Objective:  ?  ?Physical Exam ?Vitals reviewed.  ?Constitutional:   ?   General: He is not in acute distress. ?   Appearance: He is obese. He is not diaphoretic.  ?HENT:  ?   Head: Normocephalic and atraumatic.  ?   Nose: Nose normal.  ?   Mouth/Throat:  ?   Mouth: Mucous membranes are moist.  ?Eyes:  ?   General: No scleral icterus. ?   Extraocular Movements: Extraocular movements intact.  ?Cardiovascular:  ?   Rate and Rhythm: Normal rate and regular rhythm.  ?   Pulses: Normal pulses.  ?   Heart sounds: Normal heart sounds. No murmur heard. ?Pulmonary:  ?   Breath sounds: Normal breath sounds. No wheezing or rales.  ?Abdominal:  ?   Palpations: Abdomen is soft.  ?   Tenderness: There is no abdominal tenderness.  ?Musculoskeletal:  ?   Cervical back: Neck supple. No tenderness.  ?   Right lower leg: No edema.  ?   Left lower leg: No edema.  ?Skin: ?   General: Skin is warm.  ?   Findings: No rash.  ?   Comments: Cyst like structure over right foot, dorsal aspect, over 4th and 5th metatarsal area   ?Neurological:  ?   General: No focal deficit present.  ?   Mental Status: He is alert and oriented to person, place, and time.  ?   Sensory: No sensory deficit.  ?   Motor: Weakness (4+ in right UE and LE

## 2021-10-12 ENCOUNTER — Telehealth: Payer: Self-pay | Admitting: Internal Medicine

## 2021-10-12 ENCOUNTER — Other Ambulatory Visit: Payer: Self-pay | Admitting: *Deleted

## 2021-10-12 DIAGNOSIS — R0602 Shortness of breath: Secondary | ICD-10-CM

## 2021-10-12 DIAGNOSIS — R079 Chest pain, unspecified: Secondary | ICD-10-CM

## 2021-10-12 MED ORDER — ALBUTEROL SULFATE HFA 108 (90 BASE) MCG/ACT IN AERS: 2.0000 | INHALATION_SPRAY | Freq: Four times a day (QID) | RESPIRATORY_TRACT | 1 refills | Status: AC | PRN

## 2021-10-12 NOTE — Telephone Encounter (Signed)
Patient needs refill on  ? ?albuterol (VENTOLIN HFA) 108 (90 Base) MCG/ACT inhaler ? ?Wants sent to  ? ?[facility] - [place], [place] - [address]  ?

## 2021-10-12 NOTE — Telephone Encounter (Signed)
Medication sent to pharmacy  

## 2021-10-13 NOTE — Assessment & Plan Note (Signed)
BP Readings from Last 1 Encounters:  ?10/09/21 118/82  ? ?Uncontrolled with Valsartan and Amlodipine ?Counseled for compliance with the medications ?Advised DASH diet and moderate exercise/walking, at least 150 mins/week ?

## 2021-10-13 NOTE — Assessment & Plan Note (Signed)
Uses CPAP regularly Continues to benefit from it 

## 2021-10-13 NOTE — Assessment & Plan Note (Signed)
Cialis PRN 

## 2021-10-13 NOTE — Assessment & Plan Note (Addendum)
Lab Results  ?Component Value Date  ? HGBA1C 6.7 (H) 09/20/2021  ? ?On Metformin 500 mg BID and Jardiance 10 mg QD ?Had DCed Glipizide in the last visit due to concern for episodes of hypoglycemia ?Advised to follow diabetic diet ?On statin and ARB ?Reviewed CMP and lipid panel ?Diabetic eye exam: Advised to follow up with Ophthalmology for diabetic eye exam ?

## 2021-10-13 NOTE — Assessment & Plan Note (Signed)
Had residual right sided hemiparesis, which is improved now ?On Aspirin and statin ?His dizziness/foggy sensation could be due to hypoglycemia and/or dehydration, needs to maintain adequate hydration and avoid skipping any meals ?Had DCed Glipizide in the last visit due to concern for episodes of hypoglycemia ? ?

## 2021-11-05 DIAGNOSIS — G4731 Primary central sleep apnea: Secondary | ICD-10-CM | POA: Diagnosis not present

## 2021-11-28 ENCOUNTER — Other Ambulatory Visit: Payer: Self-pay | Admitting: Internal Medicine

## 2021-11-28 DIAGNOSIS — I10 Essential (primary) hypertension: Secondary | ICD-10-CM

## 2022-01-09 DIAGNOSIS — G4731 Primary central sleep apnea: Secondary | ICD-10-CM | POA: Diagnosis not present

## 2022-01-11 ENCOUNTER — Other Ambulatory Visit: Payer: Self-pay | Admitting: Internal Medicine

## 2022-01-11 DIAGNOSIS — E119 Type 2 diabetes mellitus without complications: Secondary | ICD-10-CM

## 2022-01-20 ENCOUNTER — Other Ambulatory Visit: Payer: Self-pay | Admitting: Internal Medicine

## 2022-01-22 ENCOUNTER — Other Ambulatory Visit: Payer: Self-pay | Admitting: Internal Medicine

## 2022-01-22 ENCOUNTER — Telehealth: Payer: Self-pay | Admitting: Internal Medicine

## 2022-01-22 DIAGNOSIS — E1169 Type 2 diabetes mellitus with other specified complication: Secondary | ICD-10-CM

## 2022-01-22 MED ORDER — GLIPIZIDE ER 5 MG PO TB24: 5.0000 mg | ORAL_TABLET | Freq: Every day | ORAL | 5 refills | Status: DC

## 2022-01-22 NOTE — Telephone Encounter (Signed)
Pt called states that the pharmacy told her she was in the "donut hole" for her medication JARDIANCE 25 MG TABS tablet. States she was able to afford it before but now it is over $147 and she can't afford this. She is wanting to know if you can please change the medication?   [facility] [address] [employer]

## 2022-01-25 NOTE — Telephone Encounter (Signed)
Patient aware.

## 2022-01-30 DIAGNOSIS — E1169 Type 2 diabetes mellitus with other specified complication: Secondary | ICD-10-CM | POA: Diagnosis not present

## 2022-01-31 LAB — BASIC METABOLIC PANEL
BUN/Creatinine Ratio: 13 (ref 9–20)
BUN: 14 mg/dL (ref 6–24)
CO2: 24 mmol/L (ref 20–29)
Calcium: 9.9 mg/dL (ref 8.7–10.2)
Chloride: 103 mmol/L (ref 96–106)
Creatinine, Ser: 1.04 mg/dL (ref 0.76–1.27)
Glucose: 95 mg/dL (ref 70–99)
Potassium: 4.8 mmol/L (ref 3.5–5.2)
Sodium: 140 mmol/L (ref 134–144)
eGFR: 85 mL/min/{1.73_m2} (ref >59)

## 2022-01-31 LAB — HEMOGLOBIN A1C
Est. average glucose Bld gHb Est-mCnc: 134 mg/dL
Hgb A1c MFr Bld: 6.3 % — ABNORMAL HIGH (ref 4.8–5.6)

## 2022-02-08 ENCOUNTER — Ambulatory Visit (INDEPENDENT_AMBULATORY_CARE_PROVIDER_SITE_OTHER): Payer: Medicare PPO | Admitting: Internal Medicine

## 2022-02-08 ENCOUNTER — Encounter: Payer: Self-pay | Admitting: Internal Medicine

## 2022-02-08 VITALS — BP 135/83 | HR 60 | Ht 71.0 in | Wt 252.6 lb

## 2022-02-08 DIAGNOSIS — E038 Other specified hypothyroidism: Secondary | ICD-10-CM

## 2022-02-08 DIAGNOSIS — E1169 Type 2 diabetes mellitus with other specified complication: Secondary | ICD-10-CM | POA: Diagnosis not present

## 2022-02-08 DIAGNOSIS — I69851 Hemiplegia and hemiparesis following other cerebrovascular disease affecting right dominant side: Secondary | ICD-10-CM | POA: Diagnosis not present

## 2022-02-08 DIAGNOSIS — G4733 Obstructive sleep apnea (adult) (pediatric): Secondary | ICD-10-CM

## 2022-02-08 DIAGNOSIS — I10 Essential (primary) hypertension: Secondary | ICD-10-CM

## 2022-02-08 DIAGNOSIS — E782 Mixed hyperlipidemia: Secondary | ICD-10-CM | POA: Diagnosis not present

## 2022-02-08 DIAGNOSIS — E559 Vitamin D deficiency, unspecified: Secondary | ICD-10-CM

## 2022-02-08 DIAGNOSIS — G4731 Primary central sleep apnea: Secondary | ICD-10-CM | POA: Diagnosis not present

## 2022-02-08 NOTE — Assessment & Plan Note (Signed)
Had residual right sided hemiparesis, which is improved now On Aspirin and statin

## 2022-02-08 NOTE — Assessment & Plan Note (Signed)
Uses CPAP regularly Continues to benefit from it 

## 2022-02-08 NOTE — Assessment & Plan Note (Signed)
Lab Results  Component Value Date   HGBA1C 6.3 (H) 01/30/2022   Associated with HLD and HTN Well-controlled On Metformin 500 mg BID and Glipizide 5 mg QD Could not continue Jardiance 10 mg QD due to cost concern, [facility] cares application printed out and filled Advised to follow diabetic diet On statin and ARB Reviewed CMP and lipid panel Diabetic eye exam: Advised to follow up with Ophthalmology for diabetic eye exam

## 2022-02-08 NOTE — Progress Notes (Signed)
Established Patient Office Visit  Subjective:  Patient ID: Craig Robertson, male    DOB: 1966-03-20  Age: 56 y.o. MRN: [id number]  CC:  Chief Complaint  Patient presents with   Follow-up    4 month follow up been taking medications as prescribed no other prescribed.  Want to try to get back on Jardiance.     HPI Craig Robertson is a 56 y.o. male with past medical history of HTN, CVA with residual right sided weakness, type 2 DM, OSA, GERD, lumbar radiculopathy and morbid obesity who presents for f/u of his chronic medical conditions.  Type 2 DM: His HbA1C is 6.3 now. He has been taking Metformin and Glipizide regularly. He was not able to continue Jardiance adue to cost concern. His blood glucose at home are well-controlled mostly. Denies any polyuria or polyphagia currently.  HTN: BP is well-controlled. Takes medications regularly. Patient denies headache, chest pain, dyspnea or palpitations.   OSA: Uses CPAP regularly.   Past Medical History:  Diagnosis Date   Arthritis    Complication of anesthesia    "slow to wake"   Diabetes mellitus without complication ([place])    Dyspnea    allegy season    ED (erectile dysfunction)    GERD (gastroesophageal reflux disease)    Hyperlipidemia    Hypertension    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 12/08/2013   Low testosterone    Sleep apnea    on CPAP    Stroke ([place]) 2015   TIA     Past Surgical History:  Procedure Laterality Date   BACK SURGERY     INSERTION OF MESH Left 06/14/2020   Procedure: INSERTION OF MESH;  Surgeon: Ralene Ok, MD;  Location: [place];  Service: General;  Laterality: Left;   LUMBAR [place] [facility]GERY  2015   lumbar surgery for bulging disc at surgery center    tissue reduction     in chest when he was 56 years old   TOTAL HIP ARTHROPLASTY Left 03/05/2017   Procedure: LEFT TOTAL HIP ARTHROPLASTY ANTERIOR APPROACH;  Surgeon: Paralee Cancel, MD;  Location: [facility];  Service: Orthopedics;  Laterality: Left;   70 mins   XI ROBOTIC ASSISTED INGUINAL HERNIA REPAIR WITH MESH Left 06/14/2020   Procedure: XI ROBOTIC ASSISTED LEFT INGUINAL HERNIA REPAIR WITH MESH;  Surgeon: Ralene Ok, MD;  Location: [place];  Service: General;  Laterality: Left;    Family History  Problem Relation Age of Onset   Diabetes Cousin    Diabetes Mother    Hypertension Father    Leukemia Father    Hypertension Brother    Hypertension Sister    Diabetes Paternal Aunt    Diabetes Paternal Uncle    Hypertension Brother    Hypertension Brother    Hypertension Brother    Hypertension Sister     Social History   Socioeconomic History   Marital status: Married    Spouse name: Not on file   Number of children: 4   Years of education: Not on file   Highest education level: Not on file  Occupational History   Occupation: tobacco industry  Tobacco Use   Smoking status: Never   Smokeless tobacco: Never  Vaping Use   Vaping Use: Never used  Substance and Sexual Activity   Alcohol use: No    Alcohol/week: 0.0 standard drinks of alcohol   Drug use: No   Sexual activity: Not on file  Other Topics Concern   Not  on file  Social History Narrative   Not on file   Social Determinants of Health   Financial Resource Strain: Low Risk  (03/29/2021)   Overall Financial Resource Strain (CARDIA)    Difficulty of Paying Living Expenses: Not hard at all  Food Insecurity: No Food Insecurity (03/29/2021)   Hunger Vital Sign    Worried About Running Out of Food in the Last Year: Never true    Ran Out of Food in the Last Year: Never true  Transportation Needs: No Transportation Needs (03/29/2021)   PRAPARE - Hydrologist (Medical): No    Lack of Transportation (Non-Medical): No  Physical Activity: Insufficiently Active (03/29/2021)   Exercise Vital Sign    Days of Exercise per Week: 7 days    Minutes of Exercise per Session: 10 min  Stress: No Stress Concern Present (03/29/2021)   [place]    Feeling of Stress : Not at all  Social Connections: Moderately Integrated (03/29/2021)   Social Connection and Isolation Panel [NHANES]    Frequency of Communication with Friends and Family: More than three times a week    Frequency of Social Gatherings with Friends and Family: Twice a week    Attends Religious Services: More than 4 times per year    Active Member of [employer] or Organizations: No    Attends Archivist Meetings: Never    Marital Status: Married  Human resources officer Violence: Not At Risk (03/29/2021)   Humiliation, Afraid, Rape, and Kick questionnaire    Fear of Current or Ex-Partner: No    Emotionally Abused: No    Physically Abused: No    Sexually Abused: No    Outpatient Medications Prior to Visit  Medication Sig Dispense Refill   albuterol (VENTOLIN HFA) 108 (90 Base) MCG/ACT inhaler Inhale 2 puffs into the lungs every 6 (six) hours as needed for wheezing or shortness of breath. 18 g 1   amLODipine (NORVASC) 10 MG tablet TAKE 1 TABLET EVERY DAY 90 tablet 1   aspirin EC 81 MG tablet Take 81 mg by mouth daily. Swallow whole.     atorvastatin (LIPITOR) 40 MG tablet TAKE 1 TABLET EVERY DAY 90 tablet 1   blood glucose meter kit and supplies KIT Dispense based on patient and insurance preference. Use up to test glucose once daily. ICD 10 code E11.9. 1 each 0   famotidine (PEPCID) 20 MG tablet One after supper (Patient taking differently: Take 20 mg by mouth daily with supper.) 30 tablet 11   glipiZIDE (GLUCOTROL XL) 5 MG 24 hr tablet Take 1 tablet (5 mg total) by mouth daily with breakfast. 30 tablet 5   Lancets (ONETOUCH ULTRASOFT) lancets Use as instructed 100 each 12   metFORMIN (GLUCOPHAGE) 500 MG tablet TAKE 1 TABLET TWICE DAILY WITH MEALS 180 tablet 1   Multiple Vitamin (MULTIVITAMIN) tablet Take 1 tablet by mouth daily.     ONETOUCH VERIO test strip TEST SUGAR DAILY 50 each 5   pantoprazole (PROTONIX) 40  MG tablet TAKE 1 TABLET TWICE DAILY BEFORE MEALS 180 tablet 1   tadalafil (CIALIS) 10 MG tablet Take 1 tablet (10 mg total) by mouth daily as needed for erectile dysfunction. 30 tablet 0   valsartan (DIOVAN) 160 MG tablet TAKE 1 TABLET EVERY DAY 90 tablet 1   JARDIANCE 25 MG TABS tablet TAKE 1 TABLET BY MOUTH EVERY DAY BEFORE BREAKFAST (Patient not taking: Reported on 02/08/2022)  30 tablet 5   No facility-administered medications prior to visit.    Allergies  Allergen Reactions   Codeine Nausea And Vomiting    ROS Review of Systems  Constitutional:  Negative for chills and fever.  HENT:  Negative for congestion and sore throat.   Eyes:  Negative for pain and discharge.  Respiratory:  Negative for cough and shortness of breath.   Cardiovascular:  Negative for chest pain and palpitations.  Gastrointestinal:  Negative for constipation, diarrhea, nausea and vomiting.  Endocrine: Negative for polydipsia and polyuria.  Genitourinary:  Negative for dysuria and hematuria.  Musculoskeletal:  Negative for neck pain and neck stiffness.  Skin:  Negative for rash.  Neurological:  Negative for dizziness, weakness, numbness and headaches.  Psychiatric/Behavioral:  Negative for agitation and behavioral problems.       Objective:    Physical Exam Vitals reviewed.  Constitutional:      General: He is not in acute distress.    Appearance: He is obese. He is not diaphoretic.  HENT:     Head: Normocephalic and atraumatic.     Nose: Nose normal.     Mouth/Throat:     Mouth: Mucous membranes are moist.  Eyes:     General: No scleral icterus.    Extraocular Movements: Extraocular movements intact.  Cardiovascular:     Rate and Rhythm: Normal rate and regular rhythm.     Pulses: Normal pulses.     Heart sounds: Normal heart sounds. No murmur heard. Pulmonary:     Breath sounds: Normal breath sounds. No wheezing or rales.  Abdominal:     Palpations: Abdomen is soft.     Tenderness: There is  no abdominal tenderness.  Musculoskeletal:     Cervical back: Neck supple. No tenderness.     Right lower leg: No edema.     Left lower leg: No edema.  Skin:    General: Skin is warm.     Findings: No rash.     Comments: Cyst like structure over right foot, dorsal aspect, over 4th and 5th metatarsal area  Neurological:     General: No focal deficit present.     Mental Status: He is alert and oriented to person, place, and time.     Sensory: No sensory deficit.     Motor: Weakness (4+ in right UE and LE, 5+ in left UE) present.  Psychiatric:        Mood and Affect: Mood normal.        Behavior: Behavior normal.     BP 135/83   Pulse 60   Ht _0  (1.803 m)   Wt 252 lb 9.6 oz (114.6 kg)   SpO2 97%   BMI 35.23 kg/m  Wt Readings from Last 3 Encounters:  02/08/22 252 lb 9.6 oz (114.6 kg)  10/09/21 254 lb 3.2 oz (115.3 kg)  06/26/21 250 lb (113.4 kg)    Lab Results  Component Value Date   TSH 3.940 09/20/2021   Lab Results  Component Value Date   WBC 8.3 06/22/2021   HGB 13.5 06/22/2021   HCT 41.9 06/22/2021   MCV 88 06/22/2021   PLT 306 06/22/2021   Lab Results  Component Value Date   NA 140 01/30/2022   K 4.8 01/30/2022   CO2 24 01/30/2022   GLUCOSE 95 01/30/2022   BUN 14 01/30/2022   CREATININE 1.04 01/30/2022   BILITOT 0.3 06/22/2021   ALKPHOS 87 06/22/2021   AST 22 06/22/2021  ALT 33 06/22/2021   PROT 7.1 06/22/2021   ALBUMIN 4.4 06/22/2021   CALCIUM 9.9 01/30/2022   ANIONGAP 10 02/19/2021   EGFR 85 01/30/2022   Lab Results  Component Value Date   CHOL 137 06/22/2021   Lab Results  Component Value Date   HDL 55 06/22/2021   Lab Results  Component Value Date   LDLCALC 70 06/22/2021   Lab Results  Component Value Date   TRIG 55 06/22/2021   Lab Results  Component Value Date   CHOLHDL 2.5 06/22/2021   Lab Results  Component Value Date   HGBA1C 6.3 (H) 01/30/2022      Assessment & Plan:   Problem List Items Addressed This Visit        Cardiovascular and Mediastinum   Essential hypertension, benign - Primary (Chronic)    BP Readings from Last 1 Encounters:  02/08/22 135/83  Well-controlled with Valsartan and Amlodipine Counseled for compliance with the medications Advised DASH diet and moderate exercise/walking, at least 150 mins/week      Relevant Orders   CBC with Differential/Platelet     Respiratory   Obstructive sleep apnea    Uses CPAP regularly Continues to benefit from it      Relevant Orders   CBC with Differential/Platelet     Endocrine   Type 2 diabetes mellitus ([place])    Lab Results  Component Value Date   HGBA1C 6.3 (H) 01/30/2022  Associated with HLD and HTN Well-controlled On Metformin 500 mg BID and Glipizide 5 mg QD Could not continue Jardiance 10 mg QD due to cost concern, BI cares application printed out and filled Advised to follow diabetic diet On statin and ARB Reviewed CMP and lipid panel Diabetic eye exam: Advised to follow up with Ophthalmology for diabetic eye exam      Relevant Orders   Hemoglobin A1c   CMP14+EGFR   CBC with Differential/Platelet   Subclinical hypothyroidism   Relevant Orders   TSH     Nervous and Auditory   Hemiplegia and hemiparesis following other cerebrovascular disease affecting right dominant side ([place])    Had residual right sided hemiparesis, which is improved now On Aspirin and statin        Other   Hyperlipidemia    On Lipitor Check lipid profile      Relevant Orders   Lipid panel   Other Visit Diagnoses     Vitamin D deficiency       Relevant Orders   VITAMIN D 25 Hydroxy (Vit-D Deficiency, Fractures)       No orders of the defined types were placed in this encounter.   Follow-up: Return in about 6 months (around 08/11/2022) for Annual physical.    Lindell Spar, MD

## 2022-02-08 NOTE — Assessment & Plan Note (Signed)
BP Readings from Last 1 Encounters:  02/08/22 135/83   Well-controlled with Valsartan and Amlodipine Counseled for compliance with the medications Advised DASH diet and moderate exercise/walking, at least 150 mins/week

## 2022-02-08 NOTE — Patient Instructions (Signed)
Please continue taking medications as prescribed.  Please continue to follow low carb diet and perform moderate exercise/walking at least 150 mins/week. 

## 2022-02-08 NOTE — Assessment & Plan Note (Signed)
On Lipitor Check lipid profile 

## 2022-03-05 DIAGNOSIS — H40013 Open angle with borderline findings, low risk, bilateral: Secondary | ICD-10-CM | POA: Diagnosis not present

## 2022-03-05 DIAGNOSIS — E119 Type 2 diabetes mellitus without complications: Secondary | ICD-10-CM | POA: Diagnosis not present

## 2022-03-05 DIAGNOSIS — H524 Presbyopia: Secondary | ICD-10-CM | POA: Diagnosis not present

## 2022-03-05 DIAGNOSIS — H04123 Dry eye syndrome of bilateral lacrimal glands: Secondary | ICD-10-CM | POA: Diagnosis not present

## 2022-03-05 LAB — HM DIABETES EYE EXAM

## 2022-04-03 ENCOUNTER — Ambulatory Visit (INDEPENDENT_AMBULATORY_CARE_PROVIDER_SITE_OTHER): Payer: Medicare PPO

## 2022-04-03 DIAGNOSIS — Z0000 Encounter for general adult medical examination without abnormal findings: Secondary | ICD-10-CM

## 2022-04-03 NOTE — Patient Instructions (Signed)
Craig Robertson , Thank you for taking time to come for your Medicare Wellness Visit. I appreciate your ongoing commitment to your health goals. Please review the following plan we discussed and let me know if I can assist you in the future.   Screening recommendations/referrals: Colonoscopy: Completed Recommended yearly ophthalmology/optometry visit for glaucoma screening and checkup Recommended yearly dental visit for hygiene and checkup  Vaccinations: Influenza vaccine: Due Tdap vaccine: Completed Shingles vaccine: Get the 2nd dose at your pharmacy    Advanced directives:  wasn't for sure  Conditions/risks identified: falls, hypertension, diabetes  Next appointment: 1 year  Preventive Care 40-64 Years, Male Preventive care refers to lifestyle choices and visits with your health care provider that can promote health and wellness. What does preventive care include? A yearly physical exam. This is also called an annual well check. Dental exams once or twice a year. Routine eye exams. Ask your health care provider how often you should have your eyes checked. Personal lifestyle choices, including: Daily care of your teeth and gums. Regular physical activity. Eating a healthy diet. Avoiding tobacco and drug use. Limiting alcohol use. Practicing safe sex. Taking low-dose aspirin every day starting at age 37. What happens during an annual well check? The services and screenings done by your health care provider during your annual well check will depend on your age, overall health, lifestyle risk factors, and family history of disease. Counseling  Your health care provider may ask you questions about your: Alcohol use. Tobacco use. Drug use. Emotional well-being. Home and relationship well-being. Sexual activity. Eating habits. Work and work Astronomer. Screening  You may have the following tests or measurements: Height, weight, and BMI. Blood pressure. Lipid and cholesterol  levels. These may be checked every 5 years, or more frequently if you are over 81 years old. Skin check. Lung cancer screening. You may have this screening every year starting at age 10 if you have a 30-pack-year history of smoking and currently smoke or have quit within the past 15 years. Fecal occult blood test (FOBT) of the stool. You may have this test every year starting at age 48. Flexible sigmoidoscopy or colonoscopy. You may have a sigmoidoscopy every 5 years or a colonoscopy every 10 years starting at age 59. Prostate cancer screening. Recommendations will vary depending on your family history and other risks. Hepatitis C blood test. Hepatitis B blood test. Sexually transmitted disease (STD) testing. Diabetes screening. This is done by checking your blood sugar (glucose) after you have not eaten for a while (fasting). You may have this done every 1-3 years. Discuss your test results, treatment options, and if necessary, the need for more tests with your health care provider. Vaccines  Your health care provider may recommend certain vaccines, such as: Influenza vaccine. This is recommended every year. Tetanus, diphtheria, and acellular pertussis (Tdap, Td) vaccine. You may need a Td booster every 10 years. Zoster vaccine. You may need this after age 71. Pneumococcal 13-valent conjugate (PCV13) vaccine. You may need this if you have certain conditions and have not been vaccinated. Pneumococcal polysaccharide (PPSV23) vaccine. You may need one or two doses if you smoke cigarettes or if you have certain conditions. Talk to your health care provider about which screenings and vaccines you need and how often you need them. This information is not intended to replace advice given to you by your health care provider. Make sure you discuss any questions you have with your health care provider. Document Released: 08/05/2015 Document  Revised: 03/28/2016 Document Reviewed: 05/10/2015 Elsevier  Interactive Patient Education  2017 [employer].  Fall Prevention in the Home Falls can cause injuries. They can happen to people of all ages. There are many things you can do to make your home safe and to help prevent falls. What can I do on the outside of my home? Regularly fix the edges of walkways and driveways and fix any cracks. Remove anything that might make you trip as you walk through a door, such as a raised step or threshold. Trim any bushes or trees on the path to your home. Use bright outdoor lighting. Clear any walking paths of anything that might make someone trip, such as rocks or tools. Regularly check to see if handrails are loose or broken. Make sure that both sides of any steps have handrails. Any raised decks and porches should have guardrails on the edges. Have any leaves, snow, or ice cleared regularly. Use sand or salt on walking paths during winter. Clean up any spills in your garage right away. This includes oil or grease spills. What can I do in the bathroom? Use night lights. Install grab bars by the toilet and in the tub and shower. Do not use towel bars as grab bars. Use non-skid mats or decals in the tub or shower. If you need to sit down in the shower, use a plastic, non-slip stool. Keep the floor dry. Clean up any water that spills on the floor as soon as it happens. Remove soap buildup in the tub or shower regularly. Attach bath mats securely with double-sided non-slip rug tape. Do not have throw rugs and other things on the floor that can make you trip. What can I do in the bedroom? Use night lights. Make sure that you have a light by your bed that is easy to reach. Do not use any sheets or blankets that are too big for your bed. They should not hang down onto the floor. Have a firm chair that has side arms. You can use this for support while you get dressed. Do not have throw rugs and other things on the floor that can make you trip. What can I do  in the kitchen? Clean up any spills right away. Avoid walking on wet floors. Keep items that you use a lot in easy-to-reach places. If you need to reach something above you, use a strong step stool that has a grab bar. Keep electrical cords out of the way. Do not use floor polish or wax that makes floors slippery. If you must use wax, use non-skid floor wax. Do not have throw rugs and other things on the floor that can make you trip. What can I do with my stairs? Do not leave any items on the stairs. Make sure that there are handrails on both sides of the stairs and use them. Fix handrails that are broken or loose. Make sure that handrails are as long as the stairways. Check any carpeting to make sure that it is firmly attached to the stairs. Fix any carpet that is loose or worn. Avoid having throw rugs at the top or bottom of the stairs. If you do have throw rugs, attach them to the floor with carpet tape. Make sure that you have a light switch at the top of the stairs and the bottom of the stairs. If you do not have them, ask someone to add them for you. What else can I do to help prevent falls? Wear  shoes that: Do not have high heels. Have rubber bottoms. Are comfortable and fit you well. Are closed at the toe. Do not wear sandals. If you use a stepladder: Make sure that it is fully opened. Do not climb a closed stepladder. Make sure that both sides of the stepladder are locked into place. Ask someone to hold it for you, if possible. Clearly mark and make sure that you can see: Any grab bars or handrails. First and last steps. Where the edge of each step is. Use tools that help you move around (mobility aids) if they are needed. These include: Canes. Walkers. Scooters. Crutches. Turn on the lights when you go into a dark area. Replace any light bulbs as soon as they burn out. Set up your furniture so you have a clear path. Avoid moving your furniture around. If any of your  floors are uneven, fix them. If there are any pets around you, be aware of where they are. Review your medicines with your doctor. Some medicines can make you feel dizzy. This can increase your chance of falling. Ask your doctor what other things that you can do to help prevent falls. This information is not intended to replace advice given to you by your health care provider. Make sure you discuss any questions you have with your health care provider. Document Released: 05/05/2009 Document Revised: 12/15/2015 Document Reviewed: 08/13/2014 Elsevier Interactive Patient Education  2017 [employer].

## 2022-04-03 NOTE — Progress Notes (Signed)
Subjective:   MONROE QIN is a 56 y.o. male who presents for Medicare Annual/Subsequent preventive examination. I connected with  CODEY BURLING on 04/03/22 by a audio enabled telemedicine application and verified that I am speaking with the correct person using two identifiers.  Patient Location: Home  Provider Location: Office/Clinic  I discussed the limitations of evaluation and management by telemedicine. The patient expressed understanding and agreed to proceed.   Review of Systems     Mr. Branden , Thank you for taking time to come for your Medicare Wellness Visit. I appreciate your ongoing commitment to your health goals. Please review the following plan we discussed and let me know if I can assist you in the future.   These are the goals we discussed:  Goals   None     This is a list of the screening recommended for you and due dates:  Health Maintenance  Topic Date Due   HIV Screening  Never done   Hepatitis C Screening: USPSTF Recommendation to screen - Ages 55-79 yo.  Never done   Complete foot exam   04/01/2018   Flu Shot  02/20/2022   Eye exam for diabetics  03/03/2022   Zoster (Shingles) Vaccine (2 of 2) 03/03/2022   Hemoglobin A1C  08/02/2022   Colon Cancer Screening  03/19/2028   Tetanus Vaccine  05/09/2028   HPV Vaccine  Aged Out   COVID-19 Vaccine  Discontinued          Objective:    There were no vitals filed for this visit. There is no height or weight on file to calculate BMI.     03/29/2021    4:29 PM 02/19/2021    3:55 PM 02/17/2021   12:02 PM 06/10/2020    1:22 PM 03/05/2017    6:48 AM 02/27/2017   11:19 AM 01/11/2017   11:16 AM  Advanced Directives  Does Patient Have a Medical Advance Directive? _0  No No  Would patient like information on creating a medical advance directive?  No - Patient declined No - Patient declined No - Patient declined No - Patient declined No - Patient declined     Current Medications  (verified) Outpatient Encounter Medications as of 04/03/2022  Medication Sig   albuterol (VENTOLIN HFA) 108 (90 Base) MCG/ACT inhaler Inhale 2 puffs into the lungs every 6 (six) hours as needed for wheezing or shortness of breath.   amLODipine (NORVASC) 10 MG tablet TAKE 1 TABLET EVERY DAY   aspirin EC 81 MG tablet Take 81 mg by mouth daily. Swallow whole.   atorvastatin (LIPITOR) 40 MG tablet TAKE 1 TABLET EVERY DAY   blood glucose meter kit and supplies KIT Dispense based on patient and insurance preference. Use up to test glucose once daily. ICD 10 code E11.9.   famotidine (PEPCID) 20 MG tablet One after supper (Patient taking differently: Take 20 mg by mouth daily with supper.)   glipiZIDE (GLUCOTROL XL) 5 MG 24 hr tablet Take 1 tablet (5 mg total) by mouth daily with breakfast.   JARDIANCE 25 MG TABS tablet TAKE 1 TABLET BY MOUTH EVERY DAY BEFORE BREAKFAST (Patient not taking: Reported on 02/08/2022)   Lancets (ONETOUCH ULTRASOFT) lancets Use as instructed   metFORMIN (GLUCOPHAGE) 500 MG tablet TAKE 1 TABLET TWICE DAILY WITH MEALS   Multiple Vitamin (MULTIVITAMIN) tablet Take 1 tablet by mouth daily.   ONETOUCH VERIO test strip TEST SUGAR DAILY   pantoprazole (PROTONIX) 40 MG tablet  TAKE 1 TABLET TWICE DAILY BEFORE MEALS   tadalafil (CIALIS) 10 MG tablet Take 1 tablet (10 mg total) by mouth daily as needed for erectile dysfunction.   valsartan (DIOVAN) 160 MG tablet TAKE 1 TABLET EVERY DAY   No facility-administered encounter medications on file as of 04/03/2022.    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   Low testosterone    Sleep apnea    on CPAP     Stroke ([place]) 2015   TIA    Past Surgical History:  Procedure Laterality Date   BACK SURGERY     INSERTION OF MESH Left 06/14/2020   Procedure: INSERTION OF MESH;  Surgeon: Ralene Ok, MD;  Location: [facility];  Service: General;  Laterality: Left;   LUMBAR [place] SURGERY  2015   lumbar surgery for bulging disc at surgery center    tissue reduction     in chest when he was 56 years old   TOTAL HIP ARTHROPLASTY Left 03/05/2017   Procedure: LEFT TOTAL HIP ARTHROPLASTY ANTERIOR APPROACH;  Surgeon: Paralee Cancel, MD;  Location: [facility];  Service: Orthopedics;  Laterality: Left;  70 mins   XI ROBOTIC ASSISTED INGUINAL HERNIA REPAIR WITH MESH Left 06/14/2020   Procedure: XI ROBOTIC ASSISTED LEFT INGUINAL HERNIA REPAIR WITH MESH;  Surgeon: Ralene Ok, MD;  Location: [facility];  Service: General;  Laterality: Left;   Family History  Problem Relation Age of Onset   Diabetes Cousin    Diabetes Mother    Hypertension Father    Leukemia Father    Hypertension Brother    Hypertension Sister    Diabetes Paternal Aunt    Diabetes Paternal Uncle    Hypertension Brother    Hypertension Brother    Hypertension Brother    Hypertension Sister    Social History   Socioeconomic History   Marital status: Married    Spouse name: Not on file   Number of children: 4   Years of education: Not on file   Highest education level: Not on file  Occupational History   Occupation: tobacco industry  Tobacco Use   Smoking status: Never   Smokeless tobacco: Never  Vaping Use   Vaping Use: Never used  Substance and Sexual Activity   Alcohol use: No    Alcohol/week: 0.0 standard drinks of alcohol   Drug use: No   Sexual activity: Not on file  Other Topics Concern   Not on file  Social History Narrative   Not on file   Social Determinants of Health   Financial Resource Strain: Low Risk  (03/29/2021)   Overall Financial Resource Strain (CARDIA)    Difficulty of Paying Living Expenses: Not hard at all   Food Insecurity: No Food Insecurity (03/29/2021)   Hunger Vital Sign    Worried About Running Out of Food in the Last Year: Never true    [place] in the Last Year: Never true  Transportation Needs: No Transportation Needs (03/29/2021)   PRAPARE - Hydrologist (Medical): No    Lack of Transportation (Non-Medical): No  Physical Activity: Insufficiently Active (03/29/2021)   Exercise Vital Sign    Days of Exercise per Week: 7 days    Minutes of Exercise per Session: 10 min  Stress: No Stress Concern Present (03/29/2021)   [place]    Feeling of Stress : Not at all  Social Connections: Moderately Integrated (03/29/2021)   Social Connection and Isolation Panel [NHANES]    Frequency of Communication with Friends and Family: More than three times a week    Frequency of Social Gatherings with Friends and Family: Twice a week    Attends Religious Services: More than 4 times per year    Active Member of [employer] or Organizations: No    Attends Archivist Meetings: Never    Marital Status: Married    Tobacco Counseling Counseling given: Not Answered   Clinical Intake:                 Diabetic? Yes Nutrition Risk Assessment:  Has the patient had any N/V/D within the last 2 months?  No  Does the patient have any non-healing wounds?  No  Has the patient had any unintentional weight loss or weight gain?  No   Diabetes:  Is the patient diabetic?  Yes  If diabetic, was a CBG obtained today?  No  Did the patient bring in their glucometer from home?  No  How often do you monitor your CBG's? Once a day.   Financial Strains and Diabetes Management:  Are you having any financial strains with the device, your supplies or your medication? No .  Does the patient want to be seen by Chronic Care Management for management of their diabetes?  No  Would the patient like to be referred  to a Nutritionist or for Diabetic Management?  No   Diabetic Exams:  Diabetic Eye Exam: Completed   Diabetic Foot Exam: Pt has been advised about the importance in completing this exam.          Activities of Daily Living     No data to display           Patient Care Team: Lindell Spar, MD as PCP - General (Internal Medicine)  Indicate any recent Medical Services you may have received from other than [facility] providers in the past year (date may be approximate).     Assessment:   This is a routine wellness examination for Camrin.  Hearing/Vision screen No results found.  Dietary issues and exercise activities discussed:     Goals Addressed   None   Depression Screen    02/08/2022   10:43 AM 10/09/2021   10:18 AM 06/26/2021   10:21 AM 03/29/2021    4:27 PM 03/23/2021    8:46 AM 08/02/2020   10:58 AM 05/09/2018    8:44 AM  PHQ 2/9 Scores  PHQ - 2 Score 0 0 0 0 0 0 0  PHQ- 9 Score       0    Fall Risk    02/08/2022   10:43 AM 10/09/2021   10:18 AM 06/26/2021   10:21 AM 03/29/2021    4:30 PM 03/23/2021    8:46 AM  Fall Risk   Falls in the past year? 0 0 0 0 0  Number falls in past yr: 0 0 0 0 0  Injury with Fall? 0 0 0 0 0  Risk for fall due to : No Fall Risks No Fall Risks No  Fall Risks No Fall Risks No Fall Risks  Follow up _0     FALL RISK PREVENTION PERTAINING TO THE HOME:  Any stairs in or around the home? Yes  If so, are there any without handrails? No  Home free of loose throw rugs in walkways, pet beds, electrical cords, etc? Yes  Adequate lighting in your home to reduce risk of falls? Yes   ASSISTIVE DEVICES UTILIZED TO PREVENT FALLS:  Life alert? No  Use of a cane, walker or w/c? No  Grab bars in the bathroom? Yes  Shower chair or bench in shower? No  Elevated toilet seat or a handicapped toilet? Yes    Cognitive  Function:        03/29/2021    4:30 PM  6CIT Screen  What Year? 0 points  What month? 0 points  What time? 0 points  Count back from 20 0 points  Months in reverse 0 points  Repeat phrase 10 points  Total Score 10 points    Immunizations Immunization History  Administered Date(s) Administered   Influenza,inj,Quad PF,6+ Mos 04/08/2015, 04/01/2017, 04/08/2019, 03/23/2021   Influenza-Unspecified 04/14/2014, 04/12/2016, 04/22/2018   Moderna Sars-Covid-2 Vaccination 10/22/2019, 11/21/2019, 06/24/2020   PNEUMOCOCCAL CONJUGATE-20 06/26/2021   Tdap 05/09/2018   Zoster Recombinat (Shingrix) 01/06/2022    TDAP status: Up to date  Flu Vaccine status: Due, Education has been provided regarding the importance of this vaccine. Advised may receive this vaccine at local pharmacy or Health Dept. Aware to provide a copy of the vaccination record if obtained from local pharmacy or Health Dept. Verbalized acceptance and understanding.   Covid-19 vaccine status: Completed vaccines  Qualifies for Shingles Vaccine? Yes   Zostavax completed Yes   Shingrix Completed?: No.    Education has been provided regarding the importance of this vaccine. Patient has been advised to call insurance company to determine out of pocket expense if they have not yet received this vaccine. Advised may also receive vaccine at local pharmacy or Health Dept. Verbalized acceptance and understanding.  Screening Tests Health Maintenance  Topic Date Due   HIV Screening  Never done   Hepatitis C Screening  Never done   FOOT EXAM  04/01/2018   INFLUENZA VACCINE  02/20/2022   OPHTHALMOLOGY EXAM  03/03/2022   Zoster Vaccines- Shingrix (2 of 2) 03/03/2022   HEMOGLOBIN A1C  08/02/2022   COLONOSCOPY (Pts 45-69yr Insurance coverage will need to be confirmed)  03/19/2028   TETANUS/TDAP  05/09/2028   HPV VACCINES  Aged Out   COVID-19 Vaccine  Discontinued    Health Maintenance  Health Maintenance Due  Topic Date Due    HIV Screening  Never done   Hepatitis C Screening  Never done   FOOT EXAM  04/01/2018   INFLUENZA VACCINE  02/20/2022   OPHTHALMOLOGY EXAM  03/03/2022   Zoster Vaccines- Shingrix (2 of 2) 03/03/2022    Colorectal cancer screening: Type of screening: Colonoscopy. Completed 03/19/2018. Repeat every 10 years  Lung Cancer Screening: (Low Dose CT Chest recommended if Age 10870-80years, 30 pack-year currently smoking OR have quit w/in 15years.) does not qualify.    Additional Screening:  Hepatitis C Screening: does qualify; Completed   Vision Screening: Recommended annual ophthalmology exams for early detection of glaucoma and other disorders of the eye. Is the patient up to date with their annual eye exam?  Yes  Who is the provider or what is  the name of the office in which the patient attends annual eye exams? [facility]  Dental Screening: Recommended annual dental exams for proper oral hygiene  Community Resource Referral / Chronic Care Management: CRR required this visit?  No   CCM required this visit?  No      Plan:     I have personally reviewed and noted the following in the patient's chart:   Medical and social history Use of alcohol, tobacco or illicit drugs  Current medications and supplements including opioid prescriptions. Patient is not currently taking opioid prescriptions. Functional ability and status Nutritional status Physical activity Advanced directives List of other physicians Hospitalizations, surgeries, and ER visits in previous 12 months Vitals Screenings to include cognitive, depression, and falls Referrals and appointments  In addition, I have reviewed and discussed with patient certain preventive protocols, quality metrics, and best practice recommendations. A written personalized care plan for preventive services as well as general preventive health recommendations were provided to patient.     Johny Drilling, [place]   04/03/2022   Nurse  Notes:  Mr. Murrell , Thank you for taking time to come for your Medicare Wellness Visit. I appreciate your ongoing commitment to your health goals. Please review the following plan we discussed and let me know if I can assist you in the future.   These are the goals we discussed:  Goals   None     This is a list of the screening recommended for you and due dates:  Health Maintenance  Topic Date Due   HIV Screening  Never done   Hepatitis C Screening: USPSTF Recommendation to screen - Ages 23-79 yo.  Never done   Complete foot exam   04/01/2018   Flu Shot  02/20/2022   Eye exam for diabetics  03/03/2022   Zoster (Shingles) Vaccine (2 of 2) 03/03/2022   Hemoglobin A1C  08/02/2022   Colon Cancer Screening  03/19/2028   Tetanus Vaccine  05/09/2028   HPV Vaccine  Aged Out   COVID-19 Vaccine  Discontinued

## 2022-04-12 DIAGNOSIS — G4731 Primary central sleep apnea: Secondary | ICD-10-CM | POA: Diagnosis not present

## 2022-04-16 ENCOUNTER — Other Ambulatory Visit: Payer: Self-pay | Admitting: Internal Medicine

## 2022-04-24 ENCOUNTER — Encounter: Payer: Self-pay | Admitting: *Deleted

## 2022-05-05 ENCOUNTER — Other Ambulatory Visit: Payer: Self-pay | Admitting: Internal Medicine

## 2022-05-05 DIAGNOSIS — I10 Essential (primary) hypertension: Secondary | ICD-10-CM

## 2022-07-05 ENCOUNTER — Other Ambulatory Visit: Payer: Self-pay | Admitting: Internal Medicine

## 2022-07-05 DIAGNOSIS — I10 Essential (primary) hypertension: Secondary | ICD-10-CM

## 2022-07-30 DIAGNOSIS — E782 Mixed hyperlipidemia: Secondary | ICD-10-CM | POA: Diagnosis not present

## 2022-07-30 DIAGNOSIS — E559 Vitamin D deficiency, unspecified: Secondary | ICD-10-CM | POA: Diagnosis not present

## 2022-07-30 DIAGNOSIS — E038 Other specified hypothyroidism: Secondary | ICD-10-CM | POA: Diagnosis not present

## 2022-07-30 DIAGNOSIS — E1169 Type 2 diabetes mellitus with other specified complication: Secondary | ICD-10-CM | POA: Diagnosis not present

## 2022-07-30 DIAGNOSIS — G4733 Obstructive sleep apnea (adult) (pediatric): Secondary | ICD-10-CM | POA: Diagnosis not present

## 2022-07-30 DIAGNOSIS — I10 Essential (primary) hypertension: Secondary | ICD-10-CM | POA: Diagnosis not present

## 2022-07-31 LAB — CBC WITH DIFFERENTIAL/PLATELET
Basophils Absolute: 0.1 10*3/uL (ref 0.0–0.2)
Basos: 1 %
EOS (ABSOLUTE): 0.1 10*3/uL (ref 0.0–0.4)
Eos: 2 %
Hematocrit: 42.3 % (ref 37.5–51.0)
Hemoglobin: 14 g/dL (ref 13.0–17.7)
Immature Grans (Abs): 0 10*3/uL (ref 0.0–0.1)
Immature Granulocytes: 0 %
Lymphocytes Absolute: 2.2 10*3/uL (ref 0.7–3.1)
Lymphs: 39 %
MCH: 29.1 pg (ref 26.6–33.0)
MCHC: 33.1 g/dL (ref 31.5–35.7)
MCV: 88 fL (ref 79–97)
Monocytes Absolute: 0.4 10*3/uL (ref 0.1–0.9)
Monocytes: 7 %
Neutrophils Absolute: 2.8 10*3/uL (ref 1.4–7.0)
Neutrophils: 51 %
Platelets: 283 10*3/uL (ref 150–450)
RBC: 4.81 x10E6/uL (ref 4.14–5.80)
RDW: 13.1 % (ref 11.6–15.4)
WBC: 5.6 10*3/uL (ref 3.4–10.8)

## 2022-07-31 LAB — CMP14+EGFR
ALT: 37 IU/L (ref 0–44)
AST: 28 IU/L (ref 0–40)
Albumin/Globulin Ratio: 1.9 (ref 1.2–2.2)
Albumin: 4.3 g/dL (ref 3.8–4.9)
Alkaline Phosphatase: 78 IU/L (ref 44–121)
BUN/Creatinine Ratio: 12 (ref 9–20)
BUN: 12 mg/dL (ref 6–24)
Bilirubin Total: 0.5 mg/dL (ref 0.0–1.2)
CO2: 24 mmol/L (ref 20–29)
Calcium: 9.2 mg/dL (ref 8.7–10.2)
Chloride: 102 mmol/L (ref 96–106)
Creatinine, Ser: 0.97 mg/dL (ref 0.76–1.27)
Globulin, Total: 2.3 g/dL (ref 1.5–4.5)
Glucose: 104 mg/dL — ABNORMAL HIGH (ref 70–99)
Potassium: 4.5 mmol/L (ref 3.5–5.2)
Sodium: 142 mmol/L (ref 134–144)
Total Protein: 6.6 g/dL (ref 6.0–8.5)
eGFR: 93 mL/min/{1.73_m2} (ref >59)

## 2022-07-31 LAB — VITAMIN D 25 HYDROXY (VIT D DEFICIENCY, FRACTURES): Vit D, 25-Hydroxy: 27.6 ng/mL — ABNORMAL LOW (ref 30.0–100.0)

## 2022-07-31 LAB — LIPID PANEL
Chol/HDL Ratio: 2.3 ratio (ref 0.0–5.0)
Cholesterol, Total: 133 mg/dL (ref 100–199)
HDL: 59 mg/dL (ref >39)
LDL Chol Calc (NIH): 63 mg/dL (ref 0–99)
Triglycerides: 45 mg/dL (ref 0–149)
VLDL Cholesterol Cal: 11 mg/dL (ref 5–40)

## 2022-07-31 LAB — HEMOGLOBIN A1C
Est. average glucose Bld gHb Est-mCnc: 137 mg/dL
Hgb A1c MFr Bld: 6.4 % — ABNORMAL HIGH (ref 4.8–5.6)

## 2022-07-31 LAB — TSH: TSH: 3.65 u[IU]/mL (ref 0.450–4.500)

## 2022-08-13 ENCOUNTER — Encounter: Payer: Self-pay | Admitting: Internal Medicine

## 2022-08-13 ENCOUNTER — Ambulatory Visit (INDEPENDENT_AMBULATORY_CARE_PROVIDER_SITE_OTHER): Payer: Medicare HMO | Admitting: Internal Medicine

## 2022-08-13 VITALS — BP 129/78 | HR 77 | Ht 72.0 in | Wt 255.2 lb

## 2022-08-13 DIAGNOSIS — I10 Essential (primary) hypertension: Secondary | ICD-10-CM

## 2022-08-13 DIAGNOSIS — I6930 Unspecified sequelae of cerebral infarction: Secondary | ICD-10-CM | POA: Diagnosis not present

## 2022-08-13 DIAGNOSIS — E1169 Type 2 diabetes mellitus with other specified complication: Secondary | ICD-10-CM | POA: Diagnosis not present

## 2022-08-13 DIAGNOSIS — Z0001 Encounter for general adult medical examination with abnormal findings: Secondary | ICD-10-CM | POA: Diagnosis not present

## 2022-08-13 DIAGNOSIS — K219 Gastro-esophageal reflux disease without esophagitis: Secondary | ICD-10-CM | POA: Diagnosis not present

## 2022-08-13 MED ORDER — PANTOPRAZOLE SODIUM 40 MG PO TBEC: 40.0000 mg | DELAYED_RELEASE_TABLET | Freq: Every day | ORAL | 3 refills | Status: DC

## 2022-08-13 MED ORDER — FAMOTIDINE 20 MG PO TABS: 20.0000 mg | ORAL_TABLET | Freq: Every evening | ORAL | 3 refills | Status: DC

## 2022-08-13 NOTE — Progress Notes (Unsigned)
Established Patient Office Visit  Subjective:  Patient ID: Craig Robertson, male    DOB: 1966-05-18  Age: 57 y.o. MRN: [id number]  CC:  Chief Complaint  Patient presents with   Annual Exam    HPI Craig Robertson is a 57 y.o. male with past medical history of HTN, CVA with residual right sided weakness, type 2 DM, OSA, GERD, lumbar radiculopathy and morbid obesity who presents for annual physical.  DM: His HbA1C is 6.4 now. He has been taking Metformin and Glipizide regularly. His blood glucose at home are well-controlled mostly and has blood glucose of around 100s. Denies any polyuria or polyphagia currently.  HTN: BP is well-controlled today. Takes medications regularly. Patient denies headache, dizziness, chest pain, dyspnea or palpitations.  OSA: Uses CPAP regularly.   Past Medical History:  Diagnosis Date   Arthritis    Complication of anesthesia    "slow to wake"   Diabetes mellitus without complication ([place])    Dyspnea    allegy season    ED (erectile dysfunction)    GERD (gastroesophageal reflux disease)    Hyperlipidemia    Hypertension    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 12/08/2013   Low testosterone    Sleep apnea    on CPAP    Stroke ([place]) 2015   TIA     Past Surgical History:  Procedure Laterality Date   BACK SURGERY     INSERTION OF MESH Left 06/14/2020   Procedure: INSERTION OF MESH;  Surgeon: Ralene Ok, MD;  Location: [place];  Service: General;  Laterality: Left;   LUMBAR [place] SURGERY  2015   lumbar surgery for bulging disc at surgery center    tissue reduction     in chest when he was 57 years old   TOTAL HIP ARTHROPLASTY Left 03/05/2017   Procedure: LEFT TOTAL HIP ARTHROPLASTY ANTERIOR APPROACH;  Surgeon: Paralee Cancel, MD;  Location: [facility];  Service: Orthopedics;  Laterality: Left;  70 mins   XI ROBOTIC ASSISTED INGUINAL HERNIA REPAIR WITH MESH Left 06/14/2020   Procedure: XI ROBOTIC ASSISTED LEFT INGUINAL HERNIA REPAIR WITH MESH;   Surgeon: Ralene Ok, MD;  Location: [place];  Service: General;  Laterality: Left;    Family History  Problem Relation Age of Onset   Diabetes Cousin    Diabetes Mother    Hypertension Father    Leukemia Father    Hypertension Brother    Hypertension Sister    Diabetes Paternal Aunt    Diabetes Paternal Uncle    Hypertension Brother    Hypertension Brother    Hypertension Brother    Hypertension Sister     Social History   Socioeconomic History   Marital status: Married    Spouse name: Not on file   Number of children: 4   Years of education: Not on file   Highest education level: Not on file  Occupational History   Occupation: tobacco industry  Tobacco Use   Smoking status: Never   Smokeless tobacco: Never  Vaping Use   Vaping Use: Never used  Substance and Sexual Activity   Alcohol use: No    Alcohol/week: 0.0 standard drinks of alcohol   Drug use: No   Sexual activity: Not on file  Other Topics Concern   Not on file  Social History Narrative   Not on file   Social Determinants of Health   Financial Resource Strain: Low Risk  (04/03/2022)   Overall Financial Resource Strain (CARDIA)  Difficulty of Paying Living Expenses: Not hard at all  Food Insecurity: No Food Insecurity (04/03/2022)   Hunger Vital Sign    Worried About Running Out of Food in the Last Year: Never true    Ran Out of Food in the Last Year: Never true  Transportation Needs: No Transportation Needs (04/03/2022)   PRAPARE - Hydrologist (Medical): No    Lack of Transportation (Non-Medical): No  Physical Activity: Sufficiently Active (04/03/2022)   Exercise Vital Sign    Days of Exercise per Week: 7 days    Minutes of Exercise per Session: 30 min  Stress: No Stress Concern Present (04/03/2022)   [place]    Feeling of Stress : Not at all  Social Connections: Moderately Integrated (04/03/2022)    Social Connection and Isolation Panel [NHANES]    Frequency of Communication with Friends and Family: More than three times a week    Frequency of Social Gatherings with Friends and Family: More than three times a week    Attends Religious Services: More than 4 times per year    Active Member of [employer] or Organizations: No    Attends Archivist Meetings: Never    Marital Status: Married  Human resources officer Violence: Not At Risk (04/03/2022)   Humiliation, Afraid, Rape, and Kick questionnaire    Fear of Current or Ex-Partner: No    Emotionally Abused: No    Physically Abused: No    Sexually Abused: No    Outpatient Medications Prior to Visit  Medication Sig Dispense Refill   albuterol (VENTOLIN HFA) 108 (90 Base) MCG/ACT inhaler Inhale 2 puffs into the lungs every 6 (six) hours as needed for wheezing or shortness of breath. 18 g 1   amLODipine (NORVASC) 10 MG tablet TAKE 1 TABLET EVERY DAY 90 tablet 3   aspirin EC 81 MG tablet Take 81 mg by mouth daily. Swallow whole.     atorvastatin (LIPITOR) 40 MG tablet TAKE 1 TABLET EVERY DAY 90 tablet 1   blood glucose meter kit and supplies KIT Dispense based on patient and insurance preference. Use up to test glucose once daily. ICD 10 code E11.9. 1 each 0   glipiZIDE (GLUCOTROL XL) 5 MG 24 hr tablet Take 1 tablet (5 mg total) by mouth daily with breakfast. 30 tablet 5   JARDIANCE 25 MG TABS tablet TAKE 1 TABLET BY MOUTH EVERY DAY BEFORE BREAKFAST (Patient not taking: Reported on 02/08/2022) 30 tablet 5   Lancets (ONETOUCH ULTRASOFT) lancets Use as instructed 100 each 12   metFORMIN (GLUCOPHAGE) 500 MG tablet TAKE 1 TABLET TWICE DAILY WITH MEALS 180 tablet 1   Multiple Vitamin (MULTIVITAMIN) tablet Take 1 tablet by mouth daily.     ONETOUCH VERIO test strip TEST SUGAR DAILY 50 each 5   tadalafil (CIALIS) 10 MG tablet Take 1 tablet (10 mg total) by mouth daily as needed for erectile dysfunction. 30 tablet 0   valsartan (DIOVAN) 160 MG  tablet TAKE 1 TABLET EVERY DAY 90 tablet 10   famotidine (PEPCID) 20 MG tablet One after supper (Patient taking differently: Take 20 mg by mouth daily with supper.) 30 tablet 11   pantoprazole (PROTONIX) 40 MG tablet TAKE 1 TABLET TWICE DAILY BEFORE MEALS 180 tablet 1   No facility-administered medications prior to visit.    Allergies  Allergen Reactions   Codeine Nausea And Vomiting    ROS Review of Systems  Constitutional:  Negative for chills and fever.  HENT:  Negative for congestion and sore throat.   Eyes:  Negative for pain and discharge.  Respiratory:  Negative for cough and shortness of breath.   Cardiovascular:  Negative for chest pain and palpitations.  Gastrointestinal:  Negative for constipation, diarrhea, nausea and vomiting.  Endocrine: Negative for polydipsia and polyuria.  Genitourinary:  Negative for dysuria and hematuria.  Musculoskeletal:  Negative for neck pain and neck stiffness.  Skin:  Negative for rash.  Neurological:  Negative for dizziness, weakness, numbness and headaches.  Psychiatric/Behavioral:  Negative for agitation and behavioral problems.       Objective:    Physical Exam Vitals reviewed.  Constitutional:      General: He is not in acute distress.    Appearance: He is obese. He is not diaphoretic.  HENT:     Head: Normocephalic and atraumatic.     Nose: Nose normal.     Mouth/Throat:     Mouth: Mucous membranes are moist.  Eyes:     General: No scleral icterus.    Extraocular Movements: Extraocular movements intact.  Cardiovascular:     Rate and Rhythm: Normal rate and regular rhythm.     Pulses: Normal pulses.     Heart sounds: Normal heart sounds. No murmur heard. Pulmonary:     Breath sounds: Normal breath sounds. No wheezing or rales.  Abdominal:     Palpations: Abdomen is soft.     Tenderness: There is no abdominal tenderness.  Musculoskeletal:     Cervical back: Neck supple. No tenderness.     Right lower leg: No edema.      Left lower leg: No edema.  Skin:    General: Skin is warm.     Findings: No rash.     Comments: Cyst like structure over right foot, dorsal aspect, over 4th and 5th metatarsal area  Neurological:     General: No focal deficit present.     Mental Status: He is alert and oriented to person, place, and time.     Sensory: No sensory deficit.     Motor: Weakness (4+ in right UE and LE, 5+ in left UE) present.  Psychiatric:        Mood and Affect: Mood normal.        Behavior: Behavior normal.     BP 129/78 (BP Location: Right Arm, Patient Position: Sitting, Cuff Size: Large)   Pulse 77   Ht 6' (1.829 m)   Wt 255 lb 3.2 oz (115.8 kg)   SpO2 94%   BMI 34.61 kg/m  Wt Readings from Last 3 Encounters:  08/13/22 255 lb 3.2 oz (115.8 kg)  02/08/22 252 lb 9.6 oz (114.6 kg)  10/09/21 254 lb 3.2 oz (115.3 kg)    Lab Results  Component Value Date   TSH 3.650 07/30/2022   Lab Results  Component Value Date   WBC 5.6 07/30/2022   HGB 14.0 07/30/2022   HCT 42.3 07/30/2022   MCV 88 07/30/2022   PLT 283 07/30/2022   Lab Results  Component Value Date   NA 142 07/30/2022   K 4.5 07/30/2022   CO2 24 07/30/2022   GLUCOSE 104 (H) 07/30/2022   BUN 12 07/30/2022   CREATININE 0.97 07/30/2022   BILITOT 0.5 07/30/2022   ALKPHOS 78 07/30/2022   AST 28 07/30/2022   ALT 37 07/30/2022   PROT 6.6 07/30/2022   ALBUMIN 4.3 07/30/2022   CALCIUM 9.2 07/30/2022   ANIONGAP 10  02/19/2021   EGFR 93 07/30/2022   Lab Results  Component Value Date   CHOL 133 07/30/2022   Lab Results  Component Value Date   HDL 59 07/30/2022   Lab Results  Component Value Date   LDLCALC 63 07/30/2022   Lab Results  Component Value Date   TRIG 45 07/30/2022   Lab Results  Component Value Date   CHOLHDL 2.3 07/30/2022   Lab Results  Component Value Date   HGBA1C 6.4 (H) 07/30/2022      Assessment & Plan:   Problem List Items Addressed This Visit    Essential hypertension, benign BP  Readings from Last 1 Encounters:  08/13/22 129/78   Well-controlled with Valsartan and Amlodipine Counseled for compliance with the medications Advised DASH diet and moderate exercise/walking, at least 150 mins/week  Type 2 diabetes mellitus (HCC) Lab Results  Component Value Date   HGBA1C 6.4 (H) 07/30/2022   Associated with HLD and HTN Well-controlled On Metformin 500 mg BID and Glipizide 5 mg QD Could not continue Jardiance 25 mg QD due to cost concern, BI cares application printed out and filled, but he decided not to submit it Advised to follow diabetic diet On statin and ARB Reviewed CMP and lipid panel Diabetic eye exam: Advised to follow up with Ophthalmology for diabetic eye exam  History of CVA with residual deficit Had residual right sided hemiparesis, which is improved now On Aspirin and statin His dizziness/foggy sensation could be due to hypoglycemia and/or dehydration or late effect of CVA, needs to maintain adequate hydration and avoid skipping any meals  Gastroesophageal reflux disease without esophagitis Well controlled with pantoprazole 40 mg BID Needs to take Pantoprazole in AM and Pepcid in PM  Encounter for general adult medical examination with abnormal findings Physical exam as documented. Counseling done  re healthy lifestyle involving commitment to 150 minutes exercise per week, heart healthy diet, and attaining healthy weight.The importance of adequate sleep also discussed. Changes in health habits are decided on by the patient with goals and time frames  set for achieving them. Immunization and cancer screening needs are specifically addressed at this visit.    Meds ordered this encounter  Medications   pantoprazole (PROTONIX) 40 MG tablet    Sig: Take 1 tablet (40 mg total) by mouth daily.    Dispense:  90 tablet    Refill:  3   famotidine (PEPCID) 20 MG tablet    Sig: Take 1 tablet (20 mg total) by mouth every evening.    Dispense:  90  tablet    Refill:  3    Follow-up: Return in about 4 months (around 12/12/2022) for DM.    Anabel Halon, MD

## 2022-08-13 NOTE — Patient Instructions (Addendum)
Please start taking Vitamin D 2000 IU once daily.  Please take Pantoprazole in the morning and Pepcid in the evening.  Please continue taking other medications as prescribed.  Please continue to follow low carb diet and ambulate as tolerated.

## 2022-08-13 NOTE — Assessment & Plan Note (Signed)
Had residual right sided hemiparesis, which is improved now On Aspirin and statin His dizziness/foggy sensation could be due to hypoglycemia and/or dehydration, needs to maintain adequate hydration and avoid skipping any meals

## 2022-08-13 NOTE — Assessment & Plan Note (Addendum)
Lab Results  Component Value Date   HGBA1C 6.4 (H) 07/30/2022   Associated with HLD and HTN Well-controlled On Metformin 500 mg BID and Glipizide 5 mg QD Could not continue Jardiance 25 mg QD due to cost concern, BI cares application printed out and filled, but he decided not to submit it Advised to follow diabetic diet On statin and ARB Reviewed CMP and lipid panel Diabetic eye exam: Advised to follow up with Ophthalmology for diabetic eye exam

## 2022-08-13 NOTE — Assessment & Plan Note (Signed)
BP Readings from Last 1 Encounters:  08/13/22 129/78   Well-controlled with Valsartan and Amlodipine Counseled for compliance with the medications Advised DASH diet and moderate exercise/walking, at least 150 mins/week

## 2022-08-15 LAB — MICROALBUMIN / CREATININE URINE RATIO
Creatinine, Urine: 152.6 mg/dL
Microalb/Creat Ratio: 5 mg/g creat (ref 0–29)
Microalbumin, Urine: 7.4 ug/mL

## 2022-08-15 NOTE — Assessment & Plan Note (Signed)

## 2022-08-15 NOTE — Assessment & Plan Note (Signed)
Well controlled with pantoprazole 40 mg BID Needs to take Pantoprazole in AM and Pepcid in PM

## 2022-08-19 ENCOUNTER — Other Ambulatory Visit: Payer: Self-pay | Admitting: Internal Medicine

## 2022-08-19 DIAGNOSIS — E119 Type 2 diabetes mellitus without complications: Secondary | ICD-10-CM

## 2022-08-27 ENCOUNTER — Other Ambulatory Visit: Payer: Self-pay | Admitting: Internal Medicine

## 2022-08-27 DIAGNOSIS — E1169 Type 2 diabetes mellitus with other specified complication: Secondary | ICD-10-CM

## 2022-08-30 ENCOUNTER — Other Ambulatory Visit: Payer: Self-pay | Admitting: Internal Medicine

## 2022-09-02 DIAGNOSIS — J069 Acute upper respiratory infection, unspecified: Secondary | ICD-10-CM | POA: Diagnosis not present

## 2022-09-02 DIAGNOSIS — E669 Obesity, unspecified: Secondary | ICD-10-CM | POA: Diagnosis not present

## 2022-09-02 DIAGNOSIS — Z6836 Body mass index (BMI) 36.0-36.9, adult: Secondary | ICD-10-CM | POA: Diagnosis not present

## 2022-09-02 DIAGNOSIS — U071 COVID-19: Secondary | ICD-10-CM | POA: Diagnosis not present

## 2022-09-16 DIAGNOSIS — Z6835 Body mass index (BMI) 35.0-35.9, adult: Secondary | ICD-10-CM | POA: Diagnosis not present

## 2022-09-16 DIAGNOSIS — E669 Obesity, unspecified: Secondary | ICD-10-CM | POA: Diagnosis not present

## 2022-09-16 DIAGNOSIS — J019 Acute sinusitis, unspecified: Secondary | ICD-10-CM | POA: Diagnosis not present

## 2022-09-16 DIAGNOSIS — R030 Elevated blood-pressure reading, without diagnosis of hypertension: Secondary | ICD-10-CM | POA: Diagnosis not present

## 2022-09-27 ENCOUNTER — Encounter: Payer: Self-pay | Admitting: Family Medicine

## 2022-09-27 ENCOUNTER — Ambulatory Visit (INDEPENDENT_AMBULATORY_CARE_PROVIDER_SITE_OTHER): Payer: Medicare HMO | Admitting: Family Medicine

## 2022-09-27 VITALS — BP 128/75 | HR 75 | Ht 72.0 in | Wt 251.0 lb

## 2022-09-27 DIAGNOSIS — R0981 Nasal congestion: Secondary | ICD-10-CM

## 2022-09-27 NOTE — Patient Instructions (Signed)
I appreciate the opportunity to provide care to you today!    Follow up: as needed  Sleeping with a CPAP can cause nasal congestion I recommend using over-the-counter nasal saline spray to moisturize your nasal passages I would recommend using a CPAP humidifier. Humidification adds moisture to your airflow to relieve dryness and soothe irritation Heated humidifier gently warms the air as it moisturizes  for more natural feel I recommend Increasing  your  fluids and allow for plenty of rest. Recommend Tylenol  as needed for pain, fever, or general discomfort.  Follow-up if your symptoms do not improve    Referrals today-    Please continue to a heart-healthy diet and increase your physical activities. Try to exercise for 77mns at least five times a week.      It was a pleasure to see you and I look forward to continuing to work together on your health and well-being. Please do not hesitate to call the office if you need care or have questions about your care.   Have a wonderful day and week. With Gratitude, GAlvira MondayMSN, FNP-BC

## 2022-09-27 NOTE — Progress Notes (Signed)
Acute Office Visit  Subjective:    Patient ID: Craig Robertson, male    DOB: 06-06-1966, 57 y.o.   MRN: [id number]  Chief Complaint  Patient presents with   Sinus Problem    Patient complains of continued sinus congestion from having covid 4 weeks ago.     HPI Patient is in today with complaints of continuous sinus congestion from having COVID 4 weeks ago. For the details of today's visit, please refer to the assessment and plan.     Past Medical History:  Diagnosis Date   Arthritis    Complication of anesthesia    "slow to wake"   Diabetes mellitus without complication ([place])    Dyspnea    allegy season    ED (erectile dysfunction)    GERD (gastroesophageal reflux disease)    Hyperlipidemia    Hypertension    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 12/08/2013   Low testosterone    Sleep apnea    on CPAP    Stroke ([place]) 2015   TIA     Past Surgical History:  Procedure Laterality Date   BACK SURGERY     INSERTION OF MESH Left 06/14/2020   Procedure: INSERTION OF MESH;  Surgeon: Ralene Ok, MD;  Location: [facility];  Service: General;  Laterality: Left;   LUMBAR [place] SURGERY  2015   lumbar surgery for bulging disc at surgery center    tissue reduction     in chest when he was 57 years old   TOTAL HIP ARTHROPLASTY Left 03/05/2017   Procedure: LEFT TOTAL HIP ARTHROPLASTY ANTERIOR APPROACH;  Surgeon: Paralee Cancel, MD;  Location[facility];  Service: Orthopedics;  Laterality: Left;  70 mins   XI ROBOTIC ASSISTED INGUINAL HERNIA REPAIR WITH MESH Left 06/14/2020   Procedure: XI ROBOTIC ASSISTED LEFT INGUINAL HERNIA REPAIR WITH MESH;  Surgeon: Ralene Ok, MD;  Location: [facility];  Service: General;  Laterality: Left;    Family History  Problem Relation Age of Onset   Diabetes Cousin    Diabetes Mother    Hypertension Father    Leukemia Father    Hypertension Brother    Hypertension Sister    Diabetes Paternal Aunt    Diabetes Paternal Uncle    Hypertension Brother     Hypertension Brother    Hypertension Brother    Hypertension Sister     Social History   Socioeconomic History   Marital status: Married    Spouse name: Not on file   Number of children: 4   Years of education: Not on file   Highest education level: Not on file  Occupational History   Occupation: tobacco industry  Tobacco Use   Smoking status: Never   Smokeless tobacco: Never  Vaping Use   Vaping Use: Never used  Substance and Sexual Activity   Alcohol use: No    Alcohol/week: 0.0 standard drinks of alcohol   Drug use: No   Sexual activity: Not on file  Other Topics Concern   Not on file  Social History Narrative   Not on file   Social Determinants of Health   Financial Resource Strain: Low Risk  (04/03/2022)   Overall Financial Resource Strain (CARDIA)    Difficulty of Paying Living Expenses: Not hard at all  Food Insecurity: No Food Insecurity (04/03/2022)   Hunger Vital Sign    Worried About Running Out of Food in the Last Year: Never true    [place] in the  Last Year: Never true  Transportation Needs: No Transportation Needs (04/03/2022)   PRAPARE - Hydrologist (Medical): No    Lack of Transportation (Non-Medical): No  Physical Activity: Sufficiently Active (04/03/2022)   Exercise Vital Sign    Days of Exercise per Week: 7 days    Minutes of Exercise per Session: 30 min  Stress: No Stress Concern Present (04/03/2022)   [place]    Feeling of Stress : Not at all  Social Connections: Moderately Integrated (04/03/2022)   Social Connection and Isolation Panel [NHANES]    Frequency of Communication with Friends and Family: More than three times a week    Frequency of Social Gatherings with Friends and Family: More than three times a week    Attends Religious Services: More than 4 times per year    Active Member of [employer] or Organizations: No    Attends Theatre manager Meetings: Never    Marital Status: Married  Human resources officer Violence: Not At Risk (04/03/2022)   Humiliation, Afraid, Rape, and Kick questionnaire    Fear of Current or Ex-Partner: No    Emotionally Abused: No    Physically Abused: No    Sexually Abused: No    Outpatient Medications Prior to Visit  Medication Sig Dispense Refill   albuterol (VENTOLIN HFA) 108 (90 Base) MCG/ACT inhaler Inhale 2 puffs into the lungs every 6 (six) hours as needed for wheezing or shortness of breath. 18 g 1   amLODipine (NORVASC) 10 MG tablet TAKE 1 TABLET EVERY DAY 90 tablet 3   aspirin EC 81 MG tablet Take 81 mg by mouth daily. Swallow whole.     atorvastatin (LIPITOR) 40 MG tablet TAKE 1 TABLET EVERY DAY 90 tablet 3   blood glucose meter kit and supplies KIT Dispense based on patient and insurance preference. Use up to test glucose once daily. ICD 10 code E11.9. 1 each 0   famotidine (PEPCID) 20 MG tablet Take 1 tablet (20 mg total) by mouth every evening. 90 tablet 3   glipiZIDE (GLUCOTROL XL) 5 MG 24 hr tablet TAKE 1 TABLET BY MOUTH EVERY DAY WITH BREAKFAST 90 tablet 1   JARDIANCE 25 MG TABS tablet TAKE 1 TABLET BY MOUTH EVERY DAY BEFORE BREAKFAST 30 tablet 5   Lancets (ONETOUCH ULTRASOFT) lancets Use as instructed 100 each 12   metFORMIN (GLUCOPHAGE) 500 MG tablet TAKE 1 TABLET TWICE DAILY WITH MEALS 180 tablet 3   Multiple Vitamin (MULTIVITAMIN) tablet Take 1 tablet by mouth daily.     ONETOUCH VERIO test strip TEST SUGAR DAILY 50 each 5   pantoprazole (PROTONIX) 40 MG tablet Take 1 tablet (40 mg total) by mouth daily. 90 tablet 3   tadalafil (CIALIS) 10 MG tablet Take 1 tablet (10 mg total) by mouth daily as needed for erectile dysfunction. 30 tablet 0   valsartan (DIOVAN) 160 MG tablet TAKE 1 TABLET EVERY DAY 90 tablet 10   No facility-administered medications prior to visit.    Allergies  Allergen Reactions   Tramadol    Codeine Nausea And Vomiting    Review of Systems   Constitutional:  Negative for fatigue and fever.  HENT:  Positive for congestion and sinus pain.   Eyes:  Negative for visual disturbance.  Respiratory:  Negative for chest tightness and shortness of breath.   Cardiovascular:  Negative for chest pain and palpitations.  Neurological:  Positive for headaches. Negative for  dizziness.       Objective:    Physical Exam HENT:     Nose: Congestion present. No nasal deformity, septal deviation, signs of injury or mucosal edema.     Right Turbinates: Swollen. Not pale.     Left Turbinates: Swollen. Not pale.     Mouth/Throat:     Mouth: Mucous membranes are moist.     Tongue: No lesions.     Palate: No mass and lesions.     BP 128/75   Pulse 75   Ht 6' (1.829 m)   Wt 251 lb (113.9 kg)   SpO2 96%   BMI 34.04 kg/m  Wt Readings from Last 3 Encounters:  09/27/22 251 lb (113.9 kg)  08/13/22 255 lb 3.2 oz (115.8 kg)  02/08/22 252 lb 9.6 oz (114.6 kg)       Assessment & Plan:  Congestion of nasal sinus Assessment & Plan: Ongoing symptoms since having COVID 4 weeks ago Of note, the patient does have a history of sleep apnea and sleeps with a CPAP machine Sleeping with the CPAP machine can cause nasal congestion The patient reports that he was treated twice at the urgent care with amoxicillin for 10 days on 08/02/2022 and on 08/16/2022 he was treated with prednisone for 5 days and amoxicillin for 12 days He reports minimal relief of his symptoms with the treatment regimen provided He did report compliance with treatment regimen The patient reports continuous nasal congestion and maxillary and frontal tenderness with a sinus headache Will treat today symptomatically, given that the patient has completed 2 courses of amoxicillin with prednisone Encouraged use of nasal saline spray for congestion and a heated CPAP humidifier Encouraged symptomatic treatment with Tylenol for fever body aches and chills Encouraged to increase his fluid  intake and  get adequate rest Encouraged the use of over-the-counter Mucinex for viral symptoms      Alvira Monday, FNP

## 2022-09-27 NOTE — Assessment & Plan Note (Addendum)
Ongoing symptoms since having COVID 4 weeks ago Of note, the patient does have a history of sleep apnea and sleeps with a CPAP machine Sleeping with the CPAP machine can cause nasal congestion The patient reports that he was treated twice at the urgent care with amoxicillin for 10 days on 08/02/2022 and on 08/16/2022 he was treated with prednisone for 5 days and amoxicillin for 12 days He reports minimal relief of his symptoms with the treatment regimen provided He did report compliance with treatment regimen The patient reports continuous nasal congestion and maxillary and frontal tenderness with a sinus headache Will treat today symptomatically, given that the patient has completed 2 courses of amoxicillin with prednisone Encouraged use of nasal saline spray for congestion and a heated CPAP humidifier Encouraged symptomatic treatment with Tylenol for fever body aches and chills Encouraged to increase his fluid intake and  get adequate rest Encouraged the use of over-the-counter Mucinex for viral symptoms

## 2022-10-23 DIAGNOSIS — I10 Essential (primary) hypertension: Secondary | ICD-10-CM | POA: Diagnosis not present

## 2022-10-23 DIAGNOSIS — E1165 Type 2 diabetes mellitus with hyperglycemia: Secondary | ICD-10-CM | POA: Diagnosis not present

## 2022-10-23 DIAGNOSIS — E782 Mixed hyperlipidemia: Secondary | ICD-10-CM | POA: Diagnosis not present

## 2022-10-23 DIAGNOSIS — K219 Gastro-esophageal reflux disease without esophagitis: Secondary | ICD-10-CM | POA: Diagnosis not present

## 2022-10-23 DIAGNOSIS — E669 Obesity, unspecified: Secondary | ICD-10-CM | POA: Diagnosis not present

## 2022-12-12 ENCOUNTER — Ambulatory Visit: Payer: Medicare HMO | Admitting: Internal Medicine

## 2022-12-25 ENCOUNTER — Ambulatory Visit: Payer: Medicare HMO | Admitting: Internal Medicine

## 2023-01-29 DIAGNOSIS — E669 Obesity, unspecified: Secondary | ICD-10-CM | POA: Diagnosis not present

## 2023-01-29 DIAGNOSIS — E119 Type 2 diabetes mellitus without complications: Secondary | ICD-10-CM | POA: Diagnosis not present

## 2023-01-29 DIAGNOSIS — M545 Low back pain, unspecified: Secondary | ICD-10-CM | POA: Diagnosis not present

## 2023-01-29 DIAGNOSIS — E782 Mixed hyperlipidemia: Secondary | ICD-10-CM | POA: Diagnosis not present

## 2023-01-29 DIAGNOSIS — I10 Essential (primary) hypertension: Secondary | ICD-10-CM | POA: Diagnosis not present

## 2023-02-05 DIAGNOSIS — E669 Obesity, unspecified: Secondary | ICD-10-CM | POA: Diagnosis not present

## 2023-02-05 DIAGNOSIS — E119 Type 2 diabetes mellitus without complications: Secondary | ICD-10-CM | POA: Diagnosis not present

## 2023-02-05 DIAGNOSIS — I10 Essential (primary) hypertension: Secondary | ICD-10-CM | POA: Diagnosis not present

## 2023-02-12 DIAGNOSIS — E669 Obesity, unspecified: Secondary | ICD-10-CM | POA: Diagnosis not present

## 2023-02-12 DIAGNOSIS — I10 Essential (primary) hypertension: Secondary | ICD-10-CM | POA: Diagnosis not present

## 2023-02-12 DIAGNOSIS — E119 Type 2 diabetes mellitus without complications: Secondary | ICD-10-CM | POA: Diagnosis not present

## 2023-02-19 DIAGNOSIS — I10 Essential (primary) hypertension: Secondary | ICD-10-CM | POA: Diagnosis not present

## 2023-02-19 DIAGNOSIS — E119 Type 2 diabetes mellitus without complications: Secondary | ICD-10-CM | POA: Diagnosis not present

## 2023-02-19 DIAGNOSIS — E669 Obesity, unspecified: Secondary | ICD-10-CM | POA: Diagnosis not present

## 2023-02-23 ENCOUNTER — Other Ambulatory Visit: Payer: Self-pay | Admitting: Internal Medicine

## 2023-02-23 DIAGNOSIS — E1169 Type 2 diabetes mellitus with other specified complication: Secondary | ICD-10-CM

## 2023-02-26 DIAGNOSIS — E669 Obesity, unspecified: Secondary | ICD-10-CM | POA: Diagnosis not present

## 2023-02-26 DIAGNOSIS — E119 Type 2 diabetes mellitus without complications: Secondary | ICD-10-CM | POA: Diagnosis not present

## 2023-02-26 DIAGNOSIS — I10 Essential (primary) hypertension: Secondary | ICD-10-CM | POA: Diagnosis not present

## 2023-03-05 DIAGNOSIS — E782 Mixed hyperlipidemia: Secondary | ICD-10-CM | POA: Diagnosis not present

## 2023-03-05 DIAGNOSIS — I10 Essential (primary) hypertension: Secondary | ICD-10-CM | POA: Diagnosis not present

## 2023-03-05 DIAGNOSIS — E669 Obesity, unspecified: Secondary | ICD-10-CM | POA: Diagnosis not present

## 2023-03-05 DIAGNOSIS — K219 Gastro-esophageal reflux disease without esophagitis: Secondary | ICD-10-CM | POA: Diagnosis not present

## 2023-03-05 DIAGNOSIS — E119 Type 2 diabetes mellitus without complications: Secondary | ICD-10-CM | POA: Diagnosis not present

## 2023-03-06 DIAGNOSIS — H40013 Open angle with borderline findings, low risk, bilateral: Secondary | ICD-10-CM | POA: Diagnosis not present

## 2023-03-06 DIAGNOSIS — H04123 Dry eye syndrome of bilateral lacrimal glands: Secondary | ICD-10-CM | POA: Diagnosis not present

## 2023-03-06 DIAGNOSIS — E119 Type 2 diabetes mellitus without complications: Secondary | ICD-10-CM | POA: Diagnosis not present

## 2023-03-09 DIAGNOSIS — I10 Essential (primary) hypertension: Secondary | ICD-10-CM | POA: Diagnosis not present

## 2023-03-09 DIAGNOSIS — E119 Type 2 diabetes mellitus without complications: Secondary | ICD-10-CM | POA: Diagnosis not present

## 2023-03-09 DIAGNOSIS — E669 Obesity, unspecified: Secondary | ICD-10-CM | POA: Diagnosis not present

## 2023-03-19 DIAGNOSIS — I10 Essential (primary) hypertension: Secondary | ICD-10-CM | POA: Diagnosis not present

## 2023-03-19 DIAGNOSIS — Z9911 Dependence on respirator [ventilator] status: Secondary | ICD-10-CM | POA: Diagnosis not present

## 2023-03-19 DIAGNOSIS — G4733 Obstructive sleep apnea (adult) (pediatric): Secondary | ICD-10-CM | POA: Diagnosis not present

## 2023-03-19 DIAGNOSIS — E119 Type 2 diabetes mellitus without complications: Secondary | ICD-10-CM | POA: Diagnosis not present

## 2023-03-19 DIAGNOSIS — E782 Mixed hyperlipidemia: Secondary | ICD-10-CM | POA: Diagnosis not present

## 2023-03-26 DIAGNOSIS — K219 Gastro-esophageal reflux disease without esophagitis: Secondary | ICD-10-CM | POA: Diagnosis not present

## 2023-03-26 DIAGNOSIS — I10 Essential (primary) hypertension: Secondary | ICD-10-CM | POA: Diagnosis not present

## 2023-03-26 DIAGNOSIS — E782 Mixed hyperlipidemia: Secondary | ICD-10-CM | POA: Diagnosis not present

## 2023-03-26 DIAGNOSIS — E669 Obesity, unspecified: Secondary | ICD-10-CM | POA: Diagnosis not present

## 2023-04-02 DIAGNOSIS — E119 Type 2 diabetes mellitus without complications: Secondary | ICD-10-CM | POA: Diagnosis not present

## 2023-04-02 DIAGNOSIS — I10 Essential (primary) hypertension: Secondary | ICD-10-CM | POA: Diagnosis not present

## 2023-04-02 DIAGNOSIS — E669 Obesity, unspecified: Secondary | ICD-10-CM | POA: Diagnosis not present

## 2023-04-02 DIAGNOSIS — G4733 Obstructive sleep apnea (adult) (pediatric): Secondary | ICD-10-CM | POA: Diagnosis not present

## 2023-04-09 DIAGNOSIS — E039 Hypothyroidism, unspecified: Secondary | ICD-10-CM | POA: Diagnosis not present

## 2023-04-09 DIAGNOSIS — E782 Mixed hyperlipidemia: Secondary | ICD-10-CM | POA: Diagnosis not present

## 2023-04-09 DIAGNOSIS — D539 Nutritional anemia, unspecified: Secondary | ICD-10-CM | POA: Diagnosis not present

## 2023-04-09 DIAGNOSIS — Z0001 Encounter for general adult medical examination with abnormal findings: Secondary | ICD-10-CM | POA: Diagnosis not present

## 2023-04-09 DIAGNOSIS — I10 Essential (primary) hypertension: Secondary | ICD-10-CM | POA: Diagnosis not present

## 2023-04-09 DIAGNOSIS — E1165 Type 2 diabetes mellitus with hyperglycemia: Secondary | ICD-10-CM | POA: Diagnosis not present

## 2023-04-09 DIAGNOSIS — E669 Obesity, unspecified: Secondary | ICD-10-CM | POA: Diagnosis not present

## 2023-04-09 DIAGNOSIS — E119 Type 2 diabetes mellitus without complications: Secondary | ICD-10-CM | POA: Diagnosis not present

## 2023-04-17 DIAGNOSIS — I10 Essential (primary) hypertension: Secondary | ICD-10-CM | POA: Diagnosis not present

## 2023-04-17 DIAGNOSIS — E669 Obesity, unspecified: Secondary | ICD-10-CM | POA: Diagnosis not present

## 2023-04-17 DIAGNOSIS — E119 Type 2 diabetes mellitus without complications: Secondary | ICD-10-CM | POA: Diagnosis not present

## 2023-04-23 DIAGNOSIS — E119 Type 2 diabetes mellitus without complications: Secondary | ICD-10-CM | POA: Diagnosis not present

## 2023-04-23 DIAGNOSIS — E669 Obesity, unspecified: Secondary | ICD-10-CM | POA: Diagnosis not present

## 2023-04-23 DIAGNOSIS — E782 Mixed hyperlipidemia: Secondary | ICD-10-CM | POA: Diagnosis not present

## 2023-04-23 DIAGNOSIS — I10 Essential (primary) hypertension: Secondary | ICD-10-CM | POA: Diagnosis not present

## 2023-04-24 ENCOUNTER — Other Ambulatory Visit: Payer: Self-pay | Admitting: Internal Medicine

## 2023-04-24 DIAGNOSIS — I10 Essential (primary) hypertension: Secondary | ICD-10-CM

## 2023-04-30 DIAGNOSIS — E119 Type 2 diabetes mellitus without complications: Secondary | ICD-10-CM | POA: Diagnosis not present

## 2023-04-30 DIAGNOSIS — I10 Essential (primary) hypertension: Secondary | ICD-10-CM | POA: Diagnosis not present

## 2023-04-30 DIAGNOSIS — E669 Obesity, unspecified: Secondary | ICD-10-CM | POA: Diagnosis not present

## 2023-04-30 DIAGNOSIS — K5901 Slow transit constipation: Secondary | ICD-10-CM | POA: Diagnosis not present

## 2023-05-07 DIAGNOSIS — I10 Essential (primary) hypertension: Secondary | ICD-10-CM | POA: Diagnosis not present

## 2023-05-07 DIAGNOSIS — E119 Type 2 diabetes mellitus without complications: Secondary | ICD-10-CM | POA: Diagnosis not present

## 2023-05-07 DIAGNOSIS — E669 Obesity, unspecified: Secondary | ICD-10-CM | POA: Diagnosis not present

## 2023-05-08 ENCOUNTER — Other Ambulatory Visit: Payer: Self-pay | Admitting: Internal Medicine

## 2023-05-08 DIAGNOSIS — I10 Essential (primary) hypertension: Secondary | ICD-10-CM

## 2023-05-14 DIAGNOSIS — E669 Obesity, unspecified: Secondary | ICD-10-CM | POA: Diagnosis not present

## 2023-05-14 DIAGNOSIS — I10 Essential (primary) hypertension: Secondary | ICD-10-CM | POA: Diagnosis not present

## 2023-05-21 DIAGNOSIS — J019 Acute sinusitis, unspecified: Secondary | ICD-10-CM | POA: Diagnosis not present

## 2023-05-28 DIAGNOSIS — E669 Obesity, unspecified: Secondary | ICD-10-CM | POA: Diagnosis not present

## 2023-05-28 DIAGNOSIS — E119 Type 2 diabetes mellitus without complications: Secondary | ICD-10-CM | POA: Diagnosis not present

## 2023-05-28 DIAGNOSIS — I10 Essential (primary) hypertension: Secondary | ICD-10-CM | POA: Diagnosis not present

## 2023-06-04 DIAGNOSIS — E119 Type 2 diabetes mellitus without complications: Secondary | ICD-10-CM | POA: Diagnosis not present

## 2023-06-04 DIAGNOSIS — K219 Gastro-esophageal reflux disease without esophagitis: Secondary | ICD-10-CM | POA: Diagnosis not present

## 2023-06-04 DIAGNOSIS — I10 Essential (primary) hypertension: Secondary | ICD-10-CM | POA: Diagnosis not present

## 2023-06-04 DIAGNOSIS — E782 Mixed hyperlipidemia: Secondary | ICD-10-CM | POA: Diagnosis not present

## 2023-06-08 ENCOUNTER — Other Ambulatory Visit: Payer: Self-pay | Admitting: Internal Medicine

## 2023-06-08 DIAGNOSIS — E119 Type 2 diabetes mellitus without complications: Secondary | ICD-10-CM

## 2023-06-11 DIAGNOSIS — E119 Type 2 diabetes mellitus without complications: Secondary | ICD-10-CM | POA: Diagnosis not present

## 2023-06-11 DIAGNOSIS — E669 Obesity, unspecified: Secondary | ICD-10-CM | POA: Diagnosis not present

## 2023-06-11 DIAGNOSIS — K5901 Slow transit constipation: Secondary | ICD-10-CM | POA: Diagnosis not present

## 2023-06-18 DIAGNOSIS — E669 Obesity, unspecified: Secondary | ICD-10-CM | POA: Diagnosis not present

## 2023-06-18 DIAGNOSIS — E8881 Metabolic syndrome: Secondary | ICD-10-CM | POA: Diagnosis not present

## 2023-06-19 ENCOUNTER — Other Ambulatory Visit: Payer: Self-pay | Admitting: Internal Medicine

## 2023-06-19 DIAGNOSIS — K219 Gastro-esophageal reflux disease without esophagitis: Secondary | ICD-10-CM

## 2023-06-25 DIAGNOSIS — E8881 Metabolic syndrome: Secondary | ICD-10-CM | POA: Diagnosis not present

## 2023-06-25 DIAGNOSIS — I10 Essential (primary) hypertension: Secondary | ICD-10-CM | POA: Diagnosis not present

## 2023-06-25 DIAGNOSIS — E669 Obesity, unspecified: Secondary | ICD-10-CM | POA: Diagnosis not present

## 2023-07-02 DIAGNOSIS — I10 Essential (primary) hypertension: Secondary | ICD-10-CM | POA: Diagnosis not present

## 2023-07-02 DIAGNOSIS — E119 Type 2 diabetes mellitus without complications: Secondary | ICD-10-CM | POA: Diagnosis not present

## 2023-07-02 DIAGNOSIS — K581 Irritable bowel syndrome with constipation: Secondary | ICD-10-CM | POA: Diagnosis not present

## 2023-07-02 DIAGNOSIS — E669 Obesity, unspecified: Secondary | ICD-10-CM | POA: Diagnosis not present

## 2023-07-09 DIAGNOSIS — E669 Obesity, unspecified: Secondary | ICD-10-CM | POA: Diagnosis not present

## 2023-07-09 DIAGNOSIS — E119 Type 2 diabetes mellitus without complications: Secondary | ICD-10-CM | POA: Diagnosis not present

## 2023-07-09 DIAGNOSIS — I10 Essential (primary) hypertension: Secondary | ICD-10-CM | POA: Diagnosis not present

## 2023-07-15 DIAGNOSIS — E119 Type 2 diabetes mellitus without complications: Secondary | ICD-10-CM | POA: Diagnosis not present

## 2023-07-15 DIAGNOSIS — E669 Obesity, unspecified: Secondary | ICD-10-CM | POA: Diagnosis not present

## 2023-07-15 DIAGNOSIS — I10 Essential (primary) hypertension: Secondary | ICD-10-CM | POA: Diagnosis not present

## 2023-07-15 DIAGNOSIS — E782 Mixed hyperlipidemia: Secondary | ICD-10-CM | POA: Diagnosis not present

## 2023-07-22 ENCOUNTER — Telehealth: Payer: Self-pay | Admitting: Internal Medicine

## 2023-07-22 DIAGNOSIS — E119 Type 2 diabetes mellitus without complications: Secondary | ICD-10-CM | POA: Diagnosis not present

## 2023-07-22 DIAGNOSIS — I10 Essential (primary) hypertension: Secondary | ICD-10-CM | POA: Diagnosis not present

## 2023-07-22 DIAGNOSIS — E669 Obesity, unspecified: Secondary | ICD-10-CM | POA: Diagnosis not present

## 2023-07-22 NOTE — Telephone Encounter (Signed)
Spoke with patient to schedule AWV- he explained that he is no longer a patient with [facility] - he is going to a doctor closer to where he lives;  Thank you,  Judeth Cornfield,  AMB Clinical Support [facility] AWV Program Direct Dial ??[phone]

## 2023-07-23 NOTE — Telephone Encounter (Signed)
Per provider and patient - removed as PCP

## 2023-07-30 DIAGNOSIS — E669 Obesity, unspecified: Secondary | ICD-10-CM | POA: Diagnosis not present

## 2023-07-30 DIAGNOSIS — I10 Essential (primary) hypertension: Secondary | ICD-10-CM | POA: Diagnosis not present

## 2023-07-30 DIAGNOSIS — E1165 Type 2 diabetes mellitus with hyperglycemia: Secondary | ICD-10-CM | POA: Diagnosis not present

## 2023-08-06 DIAGNOSIS — R110 Nausea: Secondary | ICD-10-CM | POA: Diagnosis not present

## 2023-08-06 DIAGNOSIS — K5901 Slow transit constipation: Secondary | ICD-10-CM | POA: Diagnosis not present

## 2023-08-06 DIAGNOSIS — E669 Obesity, unspecified: Secondary | ICD-10-CM | POA: Diagnosis not present

## 2023-08-06 DIAGNOSIS — E119 Type 2 diabetes mellitus without complications: Secondary | ICD-10-CM | POA: Diagnosis not present

## 2023-08-13 DIAGNOSIS — E119 Type 2 diabetes mellitus without complications: Secondary | ICD-10-CM | POA: Diagnosis not present

## 2023-08-13 DIAGNOSIS — E669 Obesity, unspecified: Secondary | ICD-10-CM | POA: Diagnosis not present

## 2023-08-13 DIAGNOSIS — R42 Dizziness and giddiness: Secondary | ICD-10-CM | POA: Diagnosis not present

## 2023-08-13 DIAGNOSIS — R519 Headache, unspecified: Secondary | ICD-10-CM | POA: Diagnosis not present

## 2023-08-20 DIAGNOSIS — E119 Type 2 diabetes mellitus without complications: Secondary | ICD-10-CM | POA: Diagnosis not present

## 2023-08-20 DIAGNOSIS — E669 Obesity, unspecified: Secondary | ICD-10-CM | POA: Diagnosis not present

## 2023-08-20 DIAGNOSIS — I10 Essential (primary) hypertension: Secondary | ICD-10-CM | POA: Diagnosis not present

## 2023-08-27 DIAGNOSIS — E669 Obesity, unspecified: Secondary | ICD-10-CM | POA: Diagnosis not present

## 2023-08-27 DIAGNOSIS — R5383 Other fatigue: Secondary | ICD-10-CM | POA: Diagnosis not present

## 2023-08-27 DIAGNOSIS — I10 Essential (primary) hypertension: Secondary | ICD-10-CM | POA: Diagnosis not present

## 2023-09-02 DIAGNOSIS — E119 Type 2 diabetes mellitus without complications: Secondary | ICD-10-CM | POA: Diagnosis not present

## 2023-09-02 DIAGNOSIS — I10 Essential (primary) hypertension: Secondary | ICD-10-CM | POA: Diagnosis not present

## 2023-09-02 DIAGNOSIS — E669 Obesity, unspecified: Secondary | ICD-10-CM | POA: Diagnosis not present

## 2023-09-10 DIAGNOSIS — E669 Obesity, unspecified: Secondary | ICD-10-CM | POA: Diagnosis not present

## 2023-09-10 DIAGNOSIS — I10 Essential (primary) hypertension: Secondary | ICD-10-CM | POA: Diagnosis not present

## 2023-09-10 DIAGNOSIS — R5383 Other fatigue: Secondary | ICD-10-CM | POA: Diagnosis not present

## 2023-09-10 DIAGNOSIS — E119 Type 2 diabetes mellitus without complications: Secondary | ICD-10-CM | POA: Diagnosis not present

## 2023-09-17 DIAGNOSIS — J069 Acute upper respiratory infection, unspecified: Secondary | ICD-10-CM | POA: Diagnosis not present

## 2023-09-17 DIAGNOSIS — R059 Cough, unspecified: Secondary | ICD-10-CM | POA: Diagnosis not present

## 2023-11-26 DIAGNOSIS — E1165 Type 2 diabetes mellitus with hyperglycemia: Secondary | ICD-10-CM | POA: Diagnosis not present

## 2023-11-26 DIAGNOSIS — I10 Essential (primary) hypertension: Secondary | ICD-10-CM | POA: Diagnosis not present

## 2023-11-26 DIAGNOSIS — E782 Mixed hyperlipidemia: Secondary | ICD-10-CM | POA: Diagnosis not present

## 2023-12-03 DIAGNOSIS — E782 Mixed hyperlipidemia: Secondary | ICD-10-CM | POA: Diagnosis not present

## 2023-12-03 DIAGNOSIS — I10 Essential (primary) hypertension: Secondary | ICD-10-CM | POA: Diagnosis not present

## 2023-12-03 DIAGNOSIS — E1165 Type 2 diabetes mellitus with hyperglycemia: Secondary | ICD-10-CM | POA: Diagnosis not present

## 2023-12-10 DIAGNOSIS — M79604 Pain in right leg: Secondary | ICD-10-CM | POA: Diagnosis not present

## 2023-12-10 DIAGNOSIS — M5416 Radiculopathy, lumbar region: Secondary | ICD-10-CM | POA: Diagnosis not present

## 2023-12-10 DIAGNOSIS — I10 Essential (primary) hypertension: Secondary | ICD-10-CM | POA: Diagnosis not present

## 2023-12-17 DIAGNOSIS — J329 Chronic sinusitis, unspecified: Secondary | ICD-10-CM | POA: Diagnosis not present

## 2023-12-17 DIAGNOSIS — R519 Headache, unspecified: Secondary | ICD-10-CM | POA: Diagnosis not present

## 2023-12-17 DIAGNOSIS — B9689 Other specified bacterial agents as the cause of diseases classified elsewhere: Secondary | ICD-10-CM | POA: Diagnosis not present

## 2023-12-20 DIAGNOSIS — G4733 Obstructive sleep apnea (adult) (pediatric): Secondary | ICD-10-CM | POA: Diagnosis not present

## 2023-12-26 DIAGNOSIS — J101 Influenza due to other identified influenza virus with other respiratory manifestations: Secondary | ICD-10-CM | POA: Diagnosis not present

## 2023-12-26 DIAGNOSIS — U071 COVID-19: Secondary | ICD-10-CM | POA: Diagnosis not present

## 2023-12-26 DIAGNOSIS — R509 Fever, unspecified: Secondary | ICD-10-CM | POA: Diagnosis not present

## 2023-12-26 DIAGNOSIS — I10 Essential (primary) hypertension: Secondary | ICD-10-CM | POA: Diagnosis not present

## 2023-12-26 DIAGNOSIS — J09X2 Influenza due to identified novel influenza A virus with other respiratory manifestations: Secondary | ICD-10-CM | POA: Diagnosis not present

## 2024-01-20 DIAGNOSIS — G4733 Obstructive sleep apnea (adult) (pediatric): Secondary | ICD-10-CM | POA: Diagnosis not present

## 2024-02-19 DIAGNOSIS — G4733 Obstructive sleep apnea (adult) (pediatric): Secondary | ICD-10-CM | POA: Diagnosis not present

## 2024-02-25 DIAGNOSIS — N529 Male erectile dysfunction, unspecified: Secondary | ICD-10-CM | POA: Diagnosis not present

## 2024-02-25 DIAGNOSIS — E1165 Type 2 diabetes mellitus with hyperglycemia: Secondary | ICD-10-CM | POA: Diagnosis not present

## 2024-02-25 DIAGNOSIS — I10 Essential (primary) hypertension: Secondary | ICD-10-CM | POA: Diagnosis not present

## 2024-03-05 DIAGNOSIS — H40013 Open angle with borderline findings, low risk, bilateral: Secondary | ICD-10-CM | POA: Diagnosis not present

## 2024-03-05 DIAGNOSIS — H25813 Combined forms of age-related cataract, bilateral: Secondary | ICD-10-CM | POA: Diagnosis not present

## 2024-03-05 DIAGNOSIS — H04123 Dry eye syndrome of bilateral lacrimal glands: Secondary | ICD-10-CM | POA: Diagnosis not present

## 2024-03-05 DIAGNOSIS — E119 Type 2 diabetes mellitus without complications: Secondary | ICD-10-CM | POA: Diagnosis not present

## 2024-03-21 DIAGNOSIS — G4733 Obstructive sleep apnea (adult) (pediatric): Secondary | ICD-10-CM | POA: Diagnosis not present

## 2024-03-25 DIAGNOSIS — E782 Mixed hyperlipidemia: Secondary | ICD-10-CM | POA: Diagnosis not present

## 2024-03-25 DIAGNOSIS — I10 Essential (primary) hypertension: Secondary | ICD-10-CM | POA: Diagnosis not present

## 2024-03-25 DIAGNOSIS — E1165 Type 2 diabetes mellitus with hyperglycemia: Secondary | ICD-10-CM | POA: Diagnosis not present

## 2024-04-03 DIAGNOSIS — G4731 Primary central sleep apnea: Secondary | ICD-10-CM | POA: Diagnosis not present

## 2024-04-03 DIAGNOSIS — G4733 Obstructive sleep apnea (adult) (pediatric): Secondary | ICD-10-CM | POA: Diagnosis not present

## 2024-04-19 ENCOUNTER — Other Ambulatory Visit: Payer: Self-pay | Admitting: Internal Medicine

## 2024-04-19 DIAGNOSIS — I10 Essential (primary) hypertension: Secondary | ICD-10-CM

## 2024-04-21 DIAGNOSIS — G4733 Obstructive sleep apnea (adult) (pediatric): Secondary | ICD-10-CM | POA: Diagnosis not present

## 2024-04-23 DIAGNOSIS — Z23 Encounter for immunization: Secondary | ICD-10-CM | POA: Diagnosis not present

## 2024-04-23 DIAGNOSIS — E1165 Type 2 diabetes mellitus with hyperglycemia: Secondary | ICD-10-CM | POA: Diagnosis not present

## 2024-05-22 DIAGNOSIS — E782 Mixed hyperlipidemia: Secondary | ICD-10-CM | POA: Diagnosis not present

## 2024-05-22 DIAGNOSIS — D538 Other specified nutritional anemias: Secondary | ICD-10-CM | POA: Diagnosis not present

## 2024-05-22 DIAGNOSIS — E039 Hypothyroidism, unspecified: Secondary | ICD-10-CM | POA: Diagnosis not present

## 2024-05-22 DIAGNOSIS — E559 Vitamin D deficiency, unspecified: Secondary | ICD-10-CM | POA: Diagnosis not present

## 2024-05-22 DIAGNOSIS — E1165 Type 2 diabetes mellitus with hyperglycemia: Secondary | ICD-10-CM | POA: Diagnosis not present

## 2024-05-22 DIAGNOSIS — I10 Essential (primary) hypertension: Secondary | ICD-10-CM | POA: Diagnosis not present

## 2024-06-16 DIAGNOSIS — I10 Essential (primary) hypertension: Secondary | ICD-10-CM | POA: Diagnosis not present

## 2024-06-16 DIAGNOSIS — J019 Acute sinusitis, unspecified: Secondary | ICD-10-CM | POA: Diagnosis not present

## 2024-06-16 DIAGNOSIS — B9689 Other specified bacterial agents as the cause of diseases classified elsewhere: Secondary | ICD-10-CM | POA: Diagnosis not present

## 2024-07-02 ENCOUNTER — Other Ambulatory Visit: Payer: Self-pay | Admitting: Internal Medicine

## 2024-07-02 DIAGNOSIS — K219 Gastro-esophageal reflux disease without esophagitis: Secondary | ICD-10-CM

## 2024-07-13 ENCOUNTER — Emergency Department (HOSPITAL_COMMUNITY)

## 2024-07-13 ENCOUNTER — Other Ambulatory Visit: Payer: Self-pay

## 2024-07-13 ENCOUNTER — Emergency Department (HOSPITAL_COMMUNITY)
Admission: EM | Admit: 2024-07-13 | Discharge: 2024-07-13 | Disposition: A | Discharge status: Home / Self Care | Attending: Emergency Medicine | Admitting: Emergency Medicine

## 2024-07-13 ENCOUNTER — Encounter (HOSPITAL_COMMUNITY): Payer: Self-pay

## 2024-07-13 DIAGNOSIS — I10 Essential (primary) hypertension: Secondary | ICD-10-CM | POA: Insufficient documentation

## 2024-07-13 DIAGNOSIS — X500XXA Overexertion from strenuous movement or load, initial encounter: Secondary | ICD-10-CM | POA: Diagnosis not present

## 2024-07-13 DIAGNOSIS — S39012A Strain of muscle, fascia and tendon of lower back, initial encounter: Secondary | ICD-10-CM | POA: Insufficient documentation

## 2024-07-13 DIAGNOSIS — M545 Low back pain, unspecified: Secondary | ICD-10-CM | POA: Diagnosis present

## 2024-07-13 DIAGNOSIS — E119 Type 2 diabetes mellitus without complications: Secondary | ICD-10-CM | POA: Diagnosis not present

## 2024-07-13 DIAGNOSIS — Z7982 Long term (current) use of aspirin: Secondary | ICD-10-CM | POA: Insufficient documentation

## 2024-07-13 MED ORDER — METHOCARBAMOL 500 MG PO TABS: 500.0000 mg | ORAL_TABLET | Freq: Two times a day (BID) | ORAL | 0 refills | Status: AC

## 2024-07-13 MED ORDER — PREDNISONE 10 MG (21) PO TBPK: ORAL_TABLET | Freq: Every day | ORAL | 0 refills | Status: AC

## 2024-07-13 MED ORDER — KETOROLAC TROMETHAMINE 15 MG/ML IJ SOLN
15.0000 mg | Freq: Once | INTRAMUSCULAR | Status: AC
  Administered 2024-07-13: 15 mg via INTRAMUSCULAR
  Filled 2024-07-13: qty 1

## 2024-07-13 MED ORDER — DIAZEPAM 5 MG/ML IJ SOLN
5.0000 mg | Freq: Once | INTRAMUSCULAR | Status: AC
  Administered 2024-07-13: 5 mg via INTRAMUSCULAR
  Filled 2024-07-13: qty 2

## 2024-07-13 NOTE — ED Provider Notes (Signed)
 " [place] EMERGENCY DEPARTMENT AT [facility] Provider Note  CSN: [id number] Arrival date & time: 07/13/24 1035  Chief Complaint(s) Back Pain  HPI Craig Robertson is a 58 y.o. male history of diabetes, hyperlipidemia, hypertension presenting to the emergency department with low back pain.  He reports that he is in pain for few weeks.  Began after he lifted a microwave.  Radiates down both legs.  Able to walk.  Pain is worse with standing.  No fevers or chills.  Denies any history of IV drug use.  Denies any numbness or tingling, weakness, bowel or bladder incontinence.  Reports history of prior lumbar back surgery with Dr. 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 12/08/2013   Low testosterone     Sleep apnea    on CPAP    Stroke (HCC) 2015   TIA    Patient Active Problem List   Diagnosis Date Noted   Congestion of nasal sinus 09/27/2022   Hemiplegia and hemiparesis following other cerebrovascular disease affecting right dominant side (HCC) 02/08/2022   Ganglion cyst of right foot 06/26/2021   Subclinical hypothyroidism 06/26/2021   Encounter for general adult medical examination with abnormal findings 06/26/2021   Pain of left hip joint 06/18/2021   Gastroesophageal reflux disease without esophagitis 02/10/2021   DOE (dyspnea on exertion) 03/17/2020   Morbid obesity due to excess calories (HCC) 03/17/2020   Obstructive sleep apnea 09/30/2018   Trochanteric bursitis of left hip 08/04/2018   Status post total hip replacement, left 03/05/2017   History of revision of total replacement of left hip joint  03/05/2017   Type 2 diabetes mellitus (HCC) 01/16/2017   Erectile dysfunction 04/24/2015   History of CVA with residual deficit 12/14/2013   Hyperlipidemia 12/14/2013   Hemiparesis, right (HCC) 11/23/2013   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 Authorizing Provider  methocarbamol  (ROBAXIN ) 500 MG tablet Take 1 tablet (500 mg total) by mouth 2 (two) times daily. 07/13/24  Yes Francesca Elsie CROME, MD  predniSONE  (STERAPRED UNI-PAK 21 TAB) 10 MG (21) TBPK tablet Take by mouth daily. Take 6 tabs by mouth daily  for 2 days, then 5 tabs for 2 days, then 4 tabs for 2 days, then 3 tabs for 2 days, 2 tabs for 2 days, then 1 tab by mouth daily for 2 days 07/13/24  Yes Francesca Elsie CROME, MD  albuterol  (VENTOLIN  HFA) 108 (90 Base) MCG/ACT inhaler Inhale 2 puffs into the lungs every 6 (six) hours as needed for wheezing or shortness of breath. 10/12/21   Tobie Suzzane POUR, MD  amLODipine  (NORVASC ) 10 MG tablet TAKE 1 TABLET EVERY DAY 04/20/24   Tobie Suzzane POUR, MD  aspirin  EC 81 MG tablet Take 81 mg by mouth daily. Swallow whole.    [provider]  atorvastatin  (LIPITOR) 40 MG tablet TAKE 1 TABLET EVERY DAY 06/19/23   Tobie Suzzane POUR, MD  blood glucose meter kit and supplies KIT Dispense based on patient and insurance preference. Use up to test glucose once daily. ICD 10 code E11.9. 06/26/21   Tobie Suzzane POUR, MD  famotidine  (PEPCID ) 20 MG tablet TAKE 1 TABLET EVERY EVENING 06/19/23   Tobie Suzzane POUR, MD  glipiZIDE  (GLUCOTROL  XL) 5 MG 24 hr tablet TAKE 1 TABLET BY MOUTH EVERY DAY WITH BREAKFAST 02/25/23   Tobie Suzzane POUR, MD  JARDIANCE  25 MG TABS tablet TAKE 1 TABLET BY MOUTH EVERY DAY BEFORE BREAKFAST 09/07/21   Patel, Rutwik K, MD  Lancets [facility] ULTRASOFT) lancets Use as instructed 03/23/21   Tobie Suzzane POUR, MD  metFORMIN   (GLUCOPHAGE ) 500 MG tablet TAKE 1 TABLET TWICE DAILY WITH MEALS 06/10/23   Patel, Rutwik K, MD  Multiple Vitamin (MULTIVITAMIN) tablet Take 1 tablet by mouth daily.    [provider]  [facility] VERIO test strip TEST SUGAR DAILY 05/09/18   Alphonsa Elsie RAMAN, MD  pantoprazole  (PROTONIX ) 40 MG tablet TAKE 1 TABLET EVERY DAY 06/19/23   Tobie Suzzane POUR, MD  tadalafil  (CIALIS ) 10 MG tablet Take 1 tablet (10 mg total) by mouth daily as needed for erectile dysfunction. 10/09/21   Tobie Suzzane POUR, MD  valsartan  (DIOVAN ) 160 MG tablet TAKE 1 TABLET EVERY DAY 04/20/24   Tobie Suzzane POUR, MD                                                                                                                                    Past Surgical History Past Surgical History:  Procedure Laterality Date   BACK SURGERY     INSERTION OF MESH Left 06/14/2020   Procedure: INSERTION OF MESH;  Surgeon: Rubin Calamity, MD;  Location: [facility] OR;  Service: General;  Laterality: Left;   LUMBAR DISC SURGERY  2015   lumbar surgery for bulging disc at surgery center    tissue reduction     in chest when he was 58 years old   TOTAL HIP ARTHROPLASTY Left 03/05/2017   Procedure: LEFT TOTAL HIP ARTHROPLASTY ANTERIOR APPROACH;  Surgeon: Ernie Cough, MD;  Location: [facility] ORS;  Service: Orthopedics;  Laterality: Left;  70 mins   XI ROBOTIC ASSISTED INGUINAL HERNIA REPAIR WITH MESH Left 06/14/2020   Procedure: XI ROBOTIC ASSISTED LEFT INGUINAL HERNIA REPAIR WITH MESH;  Surgeon: Rubin Calamity, MD;  Location: [facility] OR;  Service: General;  Laterality: Left;   Family History Family History  Problem Relation Age of Onset   Diabetes Cousin    Diabetes Mother    Hypertension Father    Leukemia Father    Hypertension Brother    Hypertension Sister    Diabetes Paternal Aunt    Diabetes Paternal Uncle    Hypertension Brother    Hypertension Brother    Hypertension Brother    Hypertension Sister     Social History Social  History[1] Allergies Tramadol  and Codeine  Review of Systems  Review of Systems  All other systems reviewed and are negative.   Physical Exam Vital Signs  I have reviewed the triage vital signs BP 132/85 (BP Location: Right Arm) Comment: Simultaneous filing. User may not have seen previous data. Comment (BP Location): Simultaneous filing. User may not have seen previous data.  Pulse 87 Comment: Simultaneous filing. User may not have seen previous data.  Temp (!) 97.5 F (36.4 C) (Temporal) Comment: Simultaneous filing. User may not have seen previous data. Comment (Src): Simultaneous filing. User may not have seen previous data.  Resp 20 Comment: Simultaneous filing. User may not have seen previous data.  Ht 6' (1.829 m)   Wt 113.9 kg   SpO2 100% Comment: Simultaneous filing. User may not have seen previous data.  BMI 34.06 kg/m  Physical Exam Vitals and nursing note reviewed.  Constitutional:      General: He is not in acute distress.    Appearance: Normal appearance.  HENT:     Head: Normocephalic and atraumatic.     Mouth/Throat:     Mouth: Mucous membranes are moist.  Eyes:     Conjunctiva/sclera: Conjunctivae normal.  Cardiovascular:     Rate and Rhythm: Normal rate.  Pulmonary:     Effort: Pulmonary effort is normal. No respiratory distress.  Abdominal:     General: Abdomen is flat.     Tenderness: There is no right CVA tenderness or left CVA tenderness.  Musculoskeletal:     Comments: Midline lumbar surgical scar on the low back.  Mild lumbar midline tenderness.  No paraspinal tenderness  Skin:    General: Skin is warm and dry.     Capillary Refill: Capillary refill takes less than 2 seconds.  Neurological:     General: No focal deficit present.     Mental Status: He is alert. Mental status is at baseline.     Comments: Strength 5 out of 5 in the bilateral lower extremities, 2+ patellar reflexes bilaterally, normal gait  Psychiatric:        Mood and Affect:  Mood normal.        Behavior: Behavior normal.     ED Results and Treatments Labs (all labs ordered are listed, but only abnormal results are displayed) Labs Reviewed - No data to display                                                                                                                        Radiology CT Lumbar Spine Wo Contrast Result Date: 07/13/2024 EXAM: CT OF THE LUMBAR SPINE WITHOUT CONTRAST 07/13/2024 03:25:41 PM TECHNIQUE: CT of the lumbar spine was performed without the administration of intravenous contrast. Multiplanar reformatted images are provided for review. Automated exposure control, iterative reconstruction, and/or weight based adjustment of the mA/kV was utilized to reduce the radiation dose to as low as reasonably achievable. COMPARISON: None available. CLINICAL HISTORY: Compression fracture, lumbar. FINDINGS: BONES AND ALIGNMENT: Conventional lumbosacral anatomy is assumed with 5 non-rib-bearing, lumbar-type vertebral  bodies. No acute fracture or suspicious bone lesion. Normal alignment. DEGENERATIVE CHANGES: Moderate disc height loss and degenerative endplate changes at L4-L5. Partially calcified disc bulge at L3-L4 results in at least mild spinal canal stenosis with narrowing of the bilateral subarticular zones. Multilevel lumbar facet arthropathy and degenerative changes of the bilateral sacroiliac joints. SOFT TISSUES: Atherosclerotic calcifications of the abdominal aorta and its branches. IMPRESSION: 1. No acute fracture or traumatic malalignment of the lumbar spine. 2. Multilevel lumbar degenerative disc disease, worst at L3-L4, where there is at least mild spinal canal stenosis and narrowing of the bilateral subarticular zones. 3. Aortic Atherosclerosis (ICD10-I70.0). Electronically signed by: Ryan Chess MD 07/13/2024 03:48 PM EST RP Workstation: [id number]    Pertinent labs & imaging results that were available during my care of the patient were reviewed  by me and considered in my medical decision making (see MDM for details).  Medications Ordered in ED Medications  ketorolac  (TORADOL ) 15 MG/ML injection 15 mg (15 mg Intramuscular Given 07/13/24 1440)  diazepam  (VALIUM ) injection 5 mg (5 mg Intramuscular Given 07/13/24 1441)                                                                                                                                     Procedures Procedures  (including critical care time)  Medical Decision Making / ED Course   MDM:  58 year old presenting to the emergency department with subacute back pain.  Patient is overall well-appearing.  Denies any neurologic symptoms to suggest acute spinal cord compression, equina syndrome examination is reassuring.  Denies any infectious symptoms to suggest any occult infection.  Differential includes muscular strain, herniated disc, given worse with standing could also represent occult fracture.  Will obtain CT lumbar spine and reassess.  Clinical Course as of 07/13/24 1621  Mon Jul 13, 2024  1620 X-ray with some arthritic changes without acute fracture or injury.  He is feeling better after treatment.  Feels patient is stable for discharge.  Recommended follow-up with his neurosurgeon given history of spine surgery as well as his primary physician. Will discharge patient to home. All questions answered. Patient comfortable with plan of discharge. Return precautions discussed with patient and specified on the after visit summary.  [WS]    Clinical Course User Index [WS] Francesca, Elsie CROME, MD     Additional history obtained: -Additional history obtained from family -External records from outside source obtained and reviewed including: Chart review including previous notes, labs, imaging, consultation notes including prior notes    Imaging Studies ordered: I ordered imaging studies including CT lumbar spine  On my interpretation imaging demonstrates no acute  process I independently visualized and interpreted imaging. I agree with the radiologist interpretation   Medicines ordered and prescription drug management: Meds ordered this encounter  Medications   ketorolac  (TORADOL ) 15 MG/ML injection 15 mg   diazepam  (VALIUM ) injection 5 mg   predniSONE  (STERAPRED UNI-PAK 21  TAB) 10 MG (21) TBPK tablet    Sig: Take by mouth daily. Take 6 tabs by mouth daily  for 2 days, then 5 tabs for 2 days, then 4 tabs for 2 days, then 3 tabs for 2 days, 2 tabs for 2 days, then 1 tab by mouth daily for 2 days    Dispense:  42 tablet    Refill:  0   methocarbamol  (ROBAXIN ) 500 MG tablet    Sig: Take 1 tablet (500 mg total) by mouth 2 (two) times daily.    Dispense:  20 tablet    Refill:  0    -I have reviewed the patients home medicines and have made adjustments as needed  Social Determinants of Health:  Diagnosis or treatment significantly limited by social determinants of health: obesity   Reevaluation: After the interventions noted above, I reevaluated the patient and found that their symptoms have improved  Co morbidities that complicate the patient evaluation  Past Medical History:  Diagnosis Date   Arthritis    Complication of anesthesia    slow to wake   Diabetes mellitus without complication (HCC)    Dyspnea    allegy season    ED (erectile dysfunction)    GERD (gastroesophageal reflux disease)    Hyperlipidemia    Hypertension    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 12/08/2013   Low testosterone     Sleep apnea    on CPAP    Stroke (HCC) 2015   TIA       Dispostion: Disposition decision including need for hospitalization was considered, and patient discharged from emergency department.    Final Clinical Impression(s) / ED Diagnoses Final diagnoses:  Strain of lumbar region, initial encounter     This chart was dictated using voice recognition software.  Despite best efforts to proofread,  errors can occur which can change  the documentation meaning.     [1]  Social History Tobacco Use   Smoking status: Never    Passive exposure: Never   Smokeless tobacco: Never  Vaping Use   Vaping status: Never Used  Substance Use Topics   Alcohol use: No    Alcohol/week: 0.0 standard drinks of alcohol   Drug use: No     Francesca Elsie CROME, MD 07/13/24 1621  "

## 2024-07-13 NOTE — ED Triage Notes (Signed)
 Pt arrived via POV c/o lower back pain that radiates down both legs that has been worsening for a few weeks after doing some heavy lifting.

## 2024-07-13 NOTE — Discharge Instructions (Addendum)
 We evaluated you for your low back pain.  Your CT scan did not show any dangerous findings.  We suspect that your symptoms may be due to arthritis or a muscle spasm.  We have attached some back exercises that you can try at home.  Please try to stay active.  Remaining in bed will make your symptoms worse.  Please take Tylenol  (acetaminophen ) and Motrin (ibuprofen) for your symptoms at home.  You can take 1000 mg of Tylenol  every 6 hours and 600 mg of Motrin every 6 hours as needed for your symptoms.  You can take these medicines together as needed, either at the same time, or alternating every 3 hours.  We have prescribed you a short steroid pack to try for your symptoms at home as well as an alternative muscle relaxant called Robaxin .  Please do not take this at the same time as cyclobenzaprine (Flexeril).  Please follow-up with Dr. Cabbell since he has seen you previously for your back surgery.  If you have any new symptoms such as weakness in your legs, trouble walking, incontinence of your bowel or bladder, fevers, please return to the emergency department.
